# Patient Record
Sex: Female | Born: 1980 | Race: Black or African American | Hispanic: No | Marital: Single | Smoking: Current every day smoker
Health system: Southern US, Community
[De-identification: ages and names within clinical notes are randomized; demographics above are authoritative.]

## PROBLEM LIST (undated history)

## (undated) ENCOUNTER — Inpatient Hospital Stay (HOSPITAL_COMMUNITY): Payer: Self-pay

## (undated) DIAGNOSIS — O360190 Maternal care for anti-D [Rh] antibodies, unspecified trimester, not applicable or unspecified: Secondary | ICD-10-CM

## (undated) DIAGNOSIS — Z8669 Personal history of other diseases of the nervous system and sense organs: Secondary | ICD-10-CM

## (undated) DIAGNOSIS — I10 Essential (primary) hypertension: Secondary | ICD-10-CM

## (undated) DIAGNOSIS — F329 Major depressive disorder, single episode, unspecified: Secondary | ICD-10-CM

## (undated) DIAGNOSIS — Z8619 Personal history of other infectious and parasitic diseases: Secondary | ICD-10-CM

## (undated) DIAGNOSIS — F32A Depression, unspecified: Secondary | ICD-10-CM

## (undated) DIAGNOSIS — A599 Trichomoniasis, unspecified: Secondary | ICD-10-CM

## (undated) DIAGNOSIS — IMO0002 Reserved for concepts with insufficient information to code with codable children: Secondary | ICD-10-CM

## (undated) DIAGNOSIS — O99345 Other mental disorders complicating the puerperium: Secondary | ICD-10-CM

## (undated) DIAGNOSIS — E7800 Pure hypercholesterolemia, unspecified: Secondary | ICD-10-CM

## (undated) DIAGNOSIS — R87619 Unspecified abnormal cytological findings in specimens from cervix uteri: Secondary | ICD-10-CM

## (undated) DIAGNOSIS — Z9851 Tubal ligation status: Secondary | ICD-10-CM

## (undated) DIAGNOSIS — F53 Postpartum depression: Secondary | ICD-10-CM

## (undated) DIAGNOSIS — Z98891 History of uterine scar from previous surgery: Secondary | ICD-10-CM

## (undated) DIAGNOSIS — O24419 Gestational diabetes mellitus in pregnancy, unspecified control: Secondary | ICD-10-CM

## (undated) DIAGNOSIS — R51 Headache: Secondary | ICD-10-CM

## (undated) DIAGNOSIS — O360990 Maternal care for other rhesus isoimmunization, unspecified trimester, not applicable or unspecified: Secondary | ICD-10-CM

## (undated) DIAGNOSIS — A549 Gonococcal infection, unspecified: Secondary | ICD-10-CM

## (undated) DIAGNOSIS — K219 Gastro-esophageal reflux disease without esophagitis: Secondary | ICD-10-CM

## (undated) HISTORY — DX: Unspecified abnormal cytological findings in specimens from cervix uteri: R87.619

## (undated) HISTORY — PX: TUBAL LIGATION: SHX77

## (undated) HISTORY — DX: Personal history of other diseases of the nervous system and sense organs: Z86.69

## (undated) HISTORY — DX: Postpartum depression: F53.0

## (undated) HISTORY — DX: Reserved for concepts with insufficient information to code with codable children: IMO0002

## (undated) HISTORY — DX: Maternal care for anti-d (rh) antibodies, unspecified trimester, not applicable or unspecified: O36.0190

## (undated) HISTORY — DX: Other mental disorders complicating the puerperium: O99.345

## (undated) HISTORY — DX: Personal history of other infectious and parasitic diseases: Z86.19

## (undated) HISTORY — DX: Headache: R51

## (undated) HISTORY — DX: Depression, unspecified: F32.A

## (undated) HISTORY — DX: Gestational diabetes mellitus in pregnancy, unspecified control: O24.419

## (undated) HISTORY — DX: Gastro-esophageal reflux disease without esophagitis: K21.9

## (undated) HISTORY — DX: Major depressive disorder, single episode, unspecified: F32.9

## (undated) HISTORY — DX: Tubal ligation status: Z98.51

---
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
## 2002-04-29 ENCOUNTER — Emergency Department (HOSPITAL_COMMUNITY): Admission: EM | Admit: 2002-04-29 | Discharge: 2002-04-29 | Payer: Self-pay | Admitting: Emergency Medicine

## 2004-01-06 ENCOUNTER — Inpatient Hospital Stay (HOSPITAL_COMMUNITY): Admission: AD | Admit: 2004-01-06 | Discharge: 2004-01-06 | Payer: Self-pay | Admitting: *Deleted

## 2004-01-17 ENCOUNTER — Ambulatory Visit (HOSPITAL_COMMUNITY): Admission: RE | Admit: 2004-01-17 | Discharge: 2004-01-17 | Payer: Self-pay | Admitting: *Deleted

## 2004-02-22 ENCOUNTER — Ambulatory Visit (HOSPITAL_COMMUNITY): Admission: RE | Admit: 2004-02-22 | Discharge: 2004-02-22 | Payer: Self-pay | Admitting: Family Medicine

## 2004-03-14 ENCOUNTER — Ambulatory Visit (HOSPITAL_COMMUNITY): Admission: RE | Admit: 2004-03-14 | Discharge: 2004-03-14 | Payer: Self-pay | Admitting: Family Medicine

## 2004-03-14 ENCOUNTER — Encounter: Admission: RE | Admit: 2004-03-14 | Discharge: 2004-03-14 | Payer: Self-pay | Admitting: Family Medicine

## 2004-03-21 ENCOUNTER — Encounter: Admission: RE | Admit: 2004-03-21 | Discharge: 2004-03-21 | Payer: Self-pay | Admitting: Family Medicine

## 2004-03-28 ENCOUNTER — Encounter: Admission: RE | Admit: 2004-03-28 | Discharge: 2004-03-28 | Payer: Self-pay | Admitting: Family Medicine

## 2004-03-28 ENCOUNTER — Ambulatory Visit (HOSPITAL_COMMUNITY): Admission: RE | Admit: 2004-03-28 | Discharge: 2004-03-28 | Payer: Self-pay | Admitting: Family Medicine

## 2004-04-02 ENCOUNTER — Inpatient Hospital Stay (HOSPITAL_COMMUNITY): Admission: AD | Admit: 2004-04-02 | Discharge: 2004-04-03 | Payer: Self-pay | Admitting: *Deleted

## 2004-04-04 ENCOUNTER — Encounter: Admission: RE | Admit: 2004-04-04 | Discharge: 2004-04-04 | Payer: Self-pay | Admitting: *Deleted

## 2004-04-11 ENCOUNTER — Ambulatory Visit: Payer: Self-pay | Admitting: Family Medicine

## 2004-04-11 ENCOUNTER — Inpatient Hospital Stay (HOSPITAL_COMMUNITY): Admission: RE | Admit: 2004-04-11 | Discharge: 2004-04-11 | Payer: Self-pay | Admitting: *Deleted

## 2004-04-11 ENCOUNTER — Encounter: Admission: RE | Admit: 2004-04-11 | Discharge: 2004-04-11 | Payer: Self-pay | Admitting: Family Medicine

## 2004-04-18 ENCOUNTER — Encounter: Admission: RE | Admit: 2004-04-18 | Discharge: 2004-04-18 | Payer: Self-pay | Admitting: Obstetrics and Gynecology

## 2004-04-18 ENCOUNTER — Other Ambulatory Visit: Admission: RE | Admit: 2004-04-18 | Discharge: 2004-04-18 | Payer: Self-pay | Admitting: Obstetrics and Gynecology

## 2004-04-19 ENCOUNTER — Encounter (INDEPENDENT_AMBULATORY_CARE_PROVIDER_SITE_OTHER): Payer: Self-pay | Admitting: *Deleted

## 2004-04-25 ENCOUNTER — Ambulatory Visit: Payer: Self-pay | Admitting: Family Medicine

## 2004-04-25 ENCOUNTER — Ambulatory Visit (HOSPITAL_COMMUNITY): Admission: RE | Admit: 2004-04-25 | Discharge: 2004-04-25 | Payer: Self-pay | Admitting: *Deleted

## 2004-05-02 ENCOUNTER — Ambulatory Visit: Payer: Self-pay | Admitting: Family Medicine

## 2004-05-02 ENCOUNTER — Ambulatory Visit (HOSPITAL_COMMUNITY): Admission: RE | Admit: 2004-05-02 | Discharge: 2004-05-02 | Payer: Self-pay | Admitting: *Deleted

## 2004-05-09 ENCOUNTER — Inpatient Hospital Stay (HOSPITAL_COMMUNITY): Admission: RE | Admit: 2004-05-09 | Discharge: 2004-05-09 | Payer: Self-pay | Admitting: *Deleted

## 2004-05-09 ENCOUNTER — Ambulatory Visit: Payer: Self-pay | Admitting: Family Medicine

## 2004-05-14 ENCOUNTER — Ambulatory Visit: Payer: Self-pay | Admitting: *Deleted

## 2004-05-16 ENCOUNTER — Ambulatory Visit: Payer: Self-pay | Admitting: Family Medicine

## 2004-05-16 ENCOUNTER — Ambulatory Visit (HOSPITAL_COMMUNITY): Admission: RE | Admit: 2004-05-16 | Discharge: 2004-05-16 | Payer: Self-pay | Admitting: *Deleted

## 2004-05-20 ENCOUNTER — Ambulatory Visit: Payer: Self-pay | Admitting: Obstetrics & Gynecology

## 2004-05-23 ENCOUNTER — Ambulatory Visit (HOSPITAL_COMMUNITY): Admission: RE | Admit: 2004-05-23 | Discharge: 2004-05-23 | Payer: Self-pay | Admitting: *Deleted

## 2004-05-23 ENCOUNTER — Ambulatory Visit: Payer: Self-pay | Admitting: Family Medicine

## 2004-05-30 ENCOUNTER — Ambulatory Visit: Payer: Self-pay | Admitting: Family Medicine

## 2004-05-30 ENCOUNTER — Ambulatory Visit (HOSPITAL_COMMUNITY): Admission: RE | Admit: 2004-05-30 | Discharge: 2004-05-30 | Payer: Self-pay | Admitting: *Deleted

## 2004-06-04 ENCOUNTER — Ambulatory Visit: Payer: Self-pay | Admitting: *Deleted

## 2004-06-04 ENCOUNTER — Ambulatory Visit (HOSPITAL_COMMUNITY): Admission: RE | Admit: 2004-06-04 | Discharge: 2004-06-04 | Payer: Self-pay | Admitting: Family Medicine

## 2004-06-06 ENCOUNTER — Ambulatory Visit: Payer: Self-pay | Admitting: *Deleted

## 2004-06-06 ENCOUNTER — Ambulatory Visit (HOSPITAL_COMMUNITY): Admission: RE | Admit: 2004-06-06 | Discharge: 2004-06-06 | Payer: Self-pay | Admitting: Obstetrics and Gynecology

## 2004-06-13 ENCOUNTER — Ambulatory Visit (HOSPITAL_COMMUNITY): Admission: RE | Admit: 2004-06-13 | Discharge: 2004-06-13 | Payer: Self-pay | Admitting: *Deleted

## 2004-06-13 ENCOUNTER — Ambulatory Visit: Payer: Self-pay | Admitting: Family Medicine

## 2004-06-15 ENCOUNTER — Inpatient Hospital Stay (HOSPITAL_COMMUNITY): Admission: AD | Admit: 2004-06-15 | Discharge: 2004-06-19 | Payer: Self-pay | Admitting: *Deleted

## 2004-06-15 ENCOUNTER — Ambulatory Visit: Payer: Self-pay | Admitting: *Deleted

## 2004-06-16 ENCOUNTER — Encounter (INDEPENDENT_AMBULATORY_CARE_PROVIDER_SITE_OTHER): Payer: Self-pay | Admitting: Specialist

## 2005-09-14 ENCOUNTER — Emergency Department (HOSPITAL_COMMUNITY): Admission: EM | Admit: 2005-09-14 | Discharge: 2005-09-15 | Payer: Self-pay | Admitting: Emergency Medicine

## 2005-09-21 ENCOUNTER — Emergency Department (HOSPITAL_COMMUNITY): Admission: EM | Admit: 2005-09-21 | Discharge: 2005-09-21 | Payer: Self-pay | Admitting: Emergency Medicine

## 2006-02-16 ENCOUNTER — Emergency Department (HOSPITAL_COMMUNITY): Admission: EM | Admit: 2006-02-16 | Discharge: 2006-02-17 | Payer: Self-pay | Admitting: Emergency Medicine

## 2006-02-27 ENCOUNTER — Emergency Department (HOSPITAL_COMMUNITY): Admission: EM | Admit: 2006-02-27 | Discharge: 2006-02-27 | Payer: Self-pay | Admitting: *Deleted

## 2006-03-14 ENCOUNTER — Emergency Department (HOSPITAL_COMMUNITY): Admission: EM | Admit: 2006-03-14 | Discharge: 2006-03-15 | Payer: Self-pay | Admitting: Emergency Medicine

## 2006-03-20 ENCOUNTER — Emergency Department (HOSPITAL_COMMUNITY): Admission: EM | Admit: 2006-03-20 | Discharge: 2006-03-20 | Payer: Self-pay | Admitting: Emergency Medicine

## 2006-08-01 ENCOUNTER — Emergency Department (HOSPITAL_COMMUNITY): Admission: EM | Admit: 2006-08-01 | Discharge: 2006-08-02 | Payer: Self-pay | Admitting: Emergency Medicine

## 2006-08-11 ENCOUNTER — Emergency Department (HOSPITAL_COMMUNITY): Admission: EM | Admit: 2006-08-11 | Discharge: 2006-08-11 | Payer: Self-pay | Admitting: Emergency Medicine

## 2006-08-25 DIAGNOSIS — O360190 Maternal care for anti-D [Rh] antibodies, unspecified trimester, not applicable or unspecified: Secondary | ICD-10-CM

## 2006-08-25 DIAGNOSIS — O24419 Gestational diabetes mellitus in pregnancy, unspecified control: Secondary | ICD-10-CM

## 2006-08-25 HISTORY — DX: Maternal care for anti-d (rh) antibodies, unspecified trimester, not applicable or unspecified: O36.0190

## 2006-08-25 HISTORY — DX: Gestational diabetes mellitus in pregnancy, unspecified control: O24.419

## 2006-09-18 ENCOUNTER — Emergency Department (HOSPITAL_COMMUNITY): Admission: EM | Admit: 2006-09-18 | Discharge: 2006-09-18 | Payer: Self-pay | Admitting: Gastroenterology

## 2006-11-26 ENCOUNTER — Ambulatory Visit (HOSPITAL_COMMUNITY): Admission: RE | Admit: 2006-11-26 | Discharge: 2006-11-26 | Payer: Self-pay | Admitting: Obstetrics and Gynecology

## 2006-12-09 ENCOUNTER — Emergency Department (HOSPITAL_COMMUNITY): Admission: EM | Admit: 2006-12-09 | Discharge: 2006-12-09 | Payer: Self-pay | Admitting: Emergency Medicine

## 2007-04-13 ENCOUNTER — Inpatient Hospital Stay (HOSPITAL_COMMUNITY): Admission: RE | Admit: 2007-04-13 | Discharge: 2007-04-16 | Payer: Self-pay | Admitting: Obstetrics and Gynecology

## 2007-04-13 ENCOUNTER — Encounter (INDEPENDENT_AMBULATORY_CARE_PROVIDER_SITE_OTHER): Payer: Self-pay | Admitting: Obstetrics and Gynecology

## 2007-12-08 ENCOUNTER — Emergency Department (HOSPITAL_COMMUNITY): Admission: EM | Admit: 2007-12-08 | Discharge: 2007-12-08 | Payer: Self-pay | Admitting: Emergency Medicine

## 2008-03-03 ENCOUNTER — Emergency Department (HOSPITAL_COMMUNITY): Admission: EM | Admit: 2008-03-03 | Discharge: 2008-03-03 | Payer: Self-pay | Admitting: Emergency Medicine

## 2008-12-23 ENCOUNTER — Emergency Department (HOSPITAL_COMMUNITY): Admission: EM | Admit: 2008-12-23 | Discharge: 2008-12-23 | Payer: Self-pay | Admitting: Emergency Medicine

## 2009-01-01 ENCOUNTER — Ambulatory Visit: Payer: Self-pay | Admitting: Obstetrics & Gynecology

## 2009-01-01 ENCOUNTER — Encounter: Payer: Self-pay | Admitting: Family Medicine

## 2009-01-01 LAB — CONVERTED CEMR LAB: Antibody Screen: POSITIVE — AB

## 2009-01-02 ENCOUNTER — Ambulatory Visit (HOSPITAL_COMMUNITY): Admission: RE | Admit: 2009-01-02 | Discharge: 2009-01-02 | Payer: Self-pay | Admitting: Family Medicine

## 2009-01-12 ENCOUNTER — Ambulatory Visit (HOSPITAL_COMMUNITY): Admission: RE | Admit: 2009-01-12 | Discharge: 2009-01-12 | Payer: Self-pay | Admitting: Family Medicine

## 2009-01-12 ENCOUNTER — Encounter: Payer: Self-pay | Admitting: Family Medicine

## 2009-01-25 ENCOUNTER — Ambulatory Visit: Payer: Self-pay | Admitting: Obstetrics & Gynecology

## 2009-01-25 LAB — CONVERTED CEMR LAB: Antibody Screen: POSITIVE — AB

## 2009-02-01 ENCOUNTER — Ambulatory Visit: Payer: Self-pay | Admitting: Obstetrics & Gynecology

## 2009-02-02 ENCOUNTER — Encounter: Payer: Self-pay | Admitting: Family Medicine

## 2009-02-02 LAB — CONVERTED CEMR LAB

## 2009-02-06 ENCOUNTER — Ambulatory Visit (HOSPITAL_COMMUNITY): Admission: RE | Admit: 2009-02-06 | Discharge: 2009-02-06 | Payer: Self-pay | Admitting: Obstetrics & Gynecology

## 2009-02-08 ENCOUNTER — Encounter: Payer: Self-pay | Admitting: Family Medicine

## 2009-02-08 ENCOUNTER — Ambulatory Visit: Payer: Self-pay | Admitting: Obstetrics & Gynecology

## 2009-02-13 ENCOUNTER — Ambulatory Visit (HOSPITAL_COMMUNITY): Admission: RE | Admit: 2009-02-13 | Discharge: 2009-02-13 | Payer: Self-pay | Admitting: Obstetrics & Gynecology

## 2009-02-15 ENCOUNTER — Ambulatory Visit: Payer: Self-pay | Admitting: Obstetrics & Gynecology

## 2009-02-20 ENCOUNTER — Ambulatory Visit (HOSPITAL_COMMUNITY): Admission: RE | Admit: 2009-02-20 | Discharge: 2009-02-20 | Payer: Self-pay | Admitting: Obstetrics & Gynecology

## 2009-02-22 ENCOUNTER — Ambulatory Visit: Payer: Self-pay | Admitting: Obstetrics & Gynecology

## 2009-02-22 ENCOUNTER — Encounter: Payer: Self-pay | Admitting: Obstetrics & Gynecology

## 2009-02-22 LAB — CONVERTED CEMR LAB
Antibody Screen: POSITIVE — AB
Chlamydia, DNA Probe: NEGATIVE
GC Probe Amp, Genital: NEGATIVE
Platelets: 149 10*3/uL — ABNORMAL LOW (ref 150–400)
RDW: 13.6 % (ref 11.5–15.5)
WBC: 7.6 10*3/uL (ref 4.0–10.5)

## 2009-02-23 ENCOUNTER — Encounter: Payer: Self-pay | Admitting: Obstetrics & Gynecology

## 2009-02-23 LAB — CONVERTED CEMR LAB
Clue Cells Wet Prep HPF POC: NONE SEEN
Trich, Wet Prep: NONE SEEN

## 2009-03-01 ENCOUNTER — Ambulatory Visit: Payer: Self-pay | Admitting: Obstetrics & Gynecology

## 2009-03-06 ENCOUNTER — Ambulatory Visit (HOSPITAL_COMMUNITY): Admission: RE | Admit: 2009-03-06 | Discharge: 2009-03-06 | Payer: Self-pay | Admitting: Family Medicine

## 2009-03-06 ENCOUNTER — Encounter: Payer: Self-pay | Admitting: Obstetrics & Gynecology

## 2009-03-06 ENCOUNTER — Encounter: Payer: Self-pay | Admitting: Family Medicine

## 2009-03-08 ENCOUNTER — Ambulatory Visit: Payer: Self-pay | Admitting: Obstetrics & Gynecology

## 2009-03-14 ENCOUNTER — Ambulatory Visit (HOSPITAL_COMMUNITY): Admission: RE | Admit: 2009-03-14 | Discharge: 2009-03-14 | Payer: Self-pay | Admitting: Obstetrics & Gynecology

## 2009-03-14 ENCOUNTER — Encounter: Payer: Self-pay | Admitting: Family Medicine

## 2009-03-19 ENCOUNTER — Ambulatory Visit: Payer: Self-pay | Admitting: Obstetrics & Gynecology

## 2009-03-19 ENCOUNTER — Encounter: Admission: RE | Admit: 2009-03-19 | Discharge: 2009-03-19 | Payer: Self-pay | Admitting: Obstetrics & Gynecology

## 2009-03-20 ENCOUNTER — Ambulatory Visit: Payer: Self-pay | Admitting: Obstetrics & Gynecology

## 2009-03-21 ENCOUNTER — Ambulatory Visit (HOSPITAL_COMMUNITY): Admission: RE | Admit: 2009-03-21 | Discharge: 2009-03-21 | Payer: Self-pay | Admitting: Obstetrics & Gynecology

## 2009-03-23 ENCOUNTER — Ambulatory Visit: Payer: Self-pay | Admitting: Obstetrics & Gynecology

## 2009-03-26 ENCOUNTER — Ambulatory Visit: Payer: Self-pay | Admitting: Obstetrics & Gynecology

## 2009-03-29 ENCOUNTER — Ambulatory Visit (HOSPITAL_COMMUNITY): Admission: RE | Admit: 2009-03-29 | Discharge: 2009-03-29 | Payer: Self-pay | Admitting: Obstetrics & Gynecology

## 2009-03-29 ENCOUNTER — Ambulatory Visit: Payer: Self-pay | Admitting: Obstetrics and Gynecology

## 2009-04-02 ENCOUNTER — Ambulatory Visit: Payer: Self-pay | Admitting: Obstetrics & Gynecology

## 2009-04-02 ENCOUNTER — Encounter: Payer: Self-pay | Admitting: Obstetrics & Gynecology

## 2009-04-03 ENCOUNTER — Encounter: Payer: Self-pay | Admitting: Obstetrics & Gynecology

## 2009-04-03 LAB — CONVERTED CEMR LAB: Yeast Wet Prep HPF POC: NONE SEEN

## 2009-04-09 ENCOUNTER — Ambulatory Visit: Payer: Self-pay | Admitting: Obstetrics & Gynecology

## 2009-04-12 ENCOUNTER — Ambulatory Visit: Payer: Self-pay | Admitting: Obstetrics and Gynecology

## 2009-04-16 ENCOUNTER — Ambulatory Visit: Payer: Self-pay | Admitting: Obstetrics & Gynecology

## 2009-04-19 ENCOUNTER — Ambulatory Visit (HOSPITAL_COMMUNITY): Admission: RE | Admit: 2009-04-19 | Discharge: 2009-04-19 | Payer: Self-pay | Admitting: Family Medicine

## 2009-04-19 ENCOUNTER — Ambulatory Visit: Payer: Self-pay | Admitting: Obstetrics & Gynecology

## 2009-04-23 ENCOUNTER — Ambulatory Visit: Payer: Self-pay | Admitting: Obstetrics & Gynecology

## 2009-04-25 ENCOUNTER — Inpatient Hospital Stay (HOSPITAL_COMMUNITY): Admission: RE | Admit: 2009-04-25 | Discharge: 2009-04-28 | Payer: Self-pay | Admitting: Obstetrics & Gynecology

## 2009-04-25 ENCOUNTER — Ambulatory Visit: Payer: Self-pay | Admitting: Obstetrics & Gynecology

## 2010-01-23 IMAGING — US US MFM MCA DOPPLER RE-EVAL
1 series · 15 of 15 positions shown · non-contrast
Comparison: none

OBSTETRICAL ULTRASOUND:
 This ultrasound was performed in The [HOSPITAL], and the [facility] report will be stored to [REDACTED] PACS.

[Series 1: us mfm mca doppler re-eval · 15 of 15 slices shown]
[im 1/15]
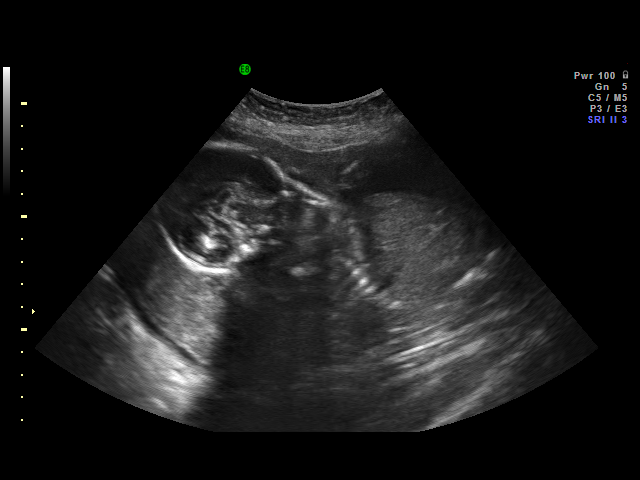
[im 2/15]
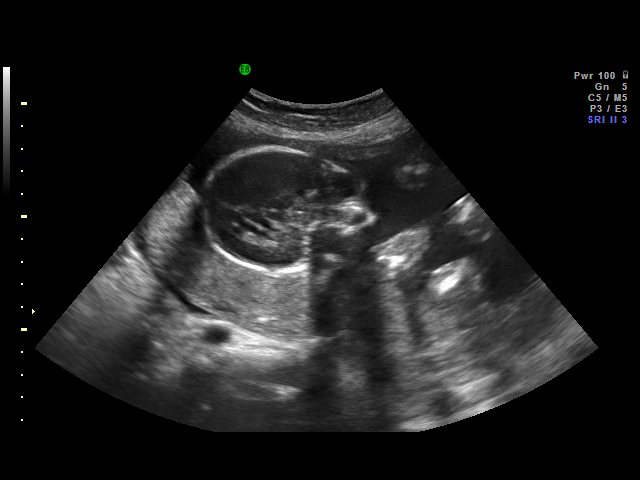
[im 3/15]
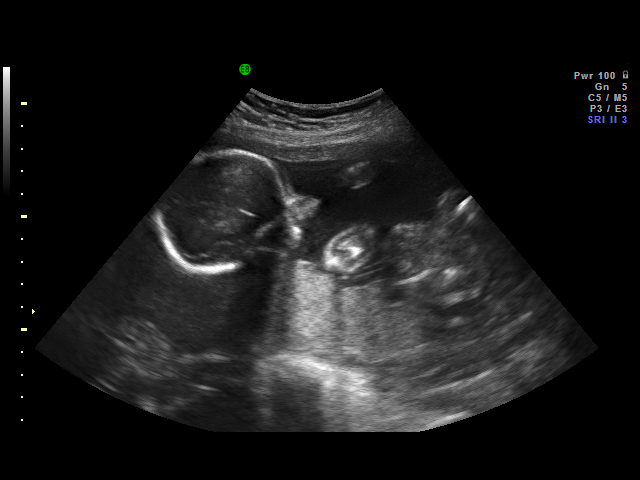
[im 4/15]
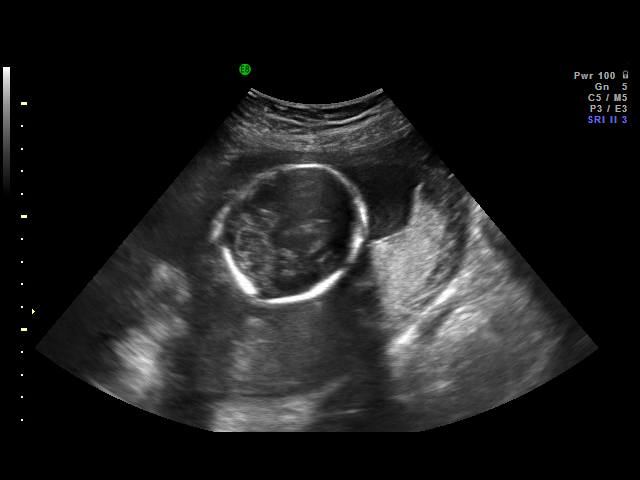
[im 5/15]
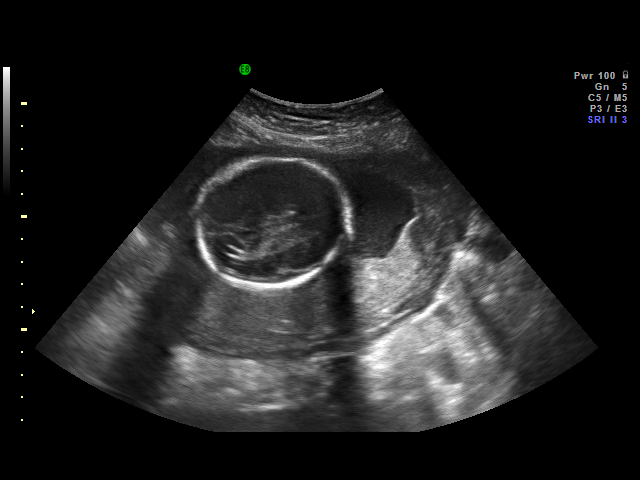
[im 6/15]
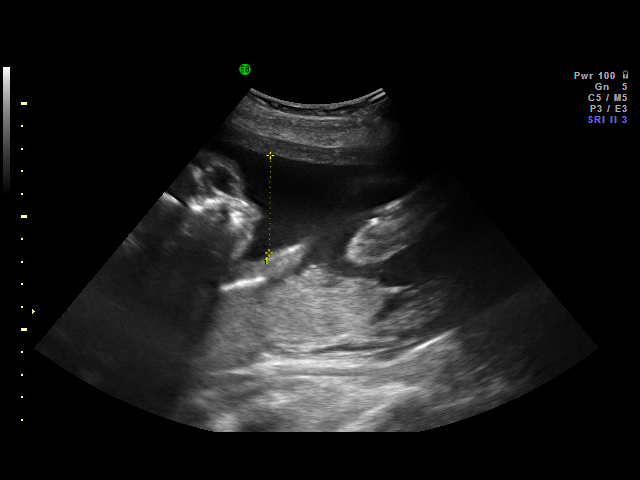
[im 7/15]
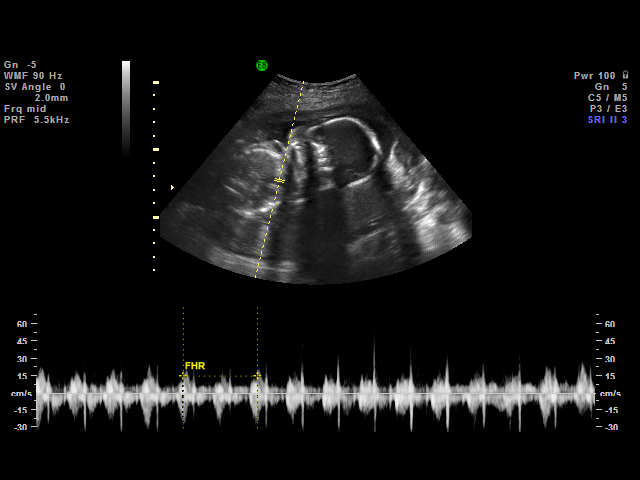
[im 8/15]
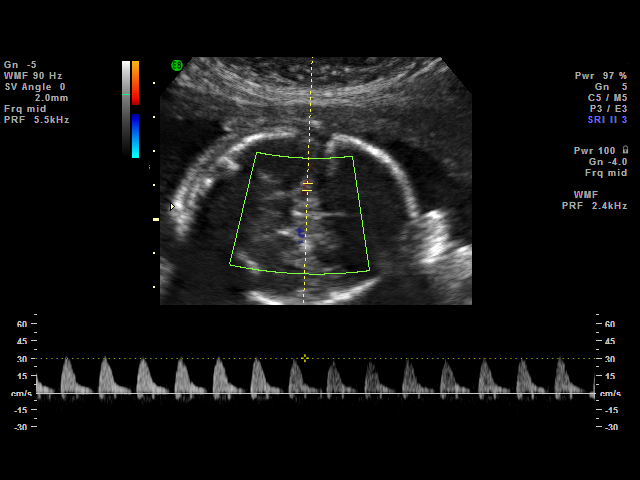
[im 9/15]
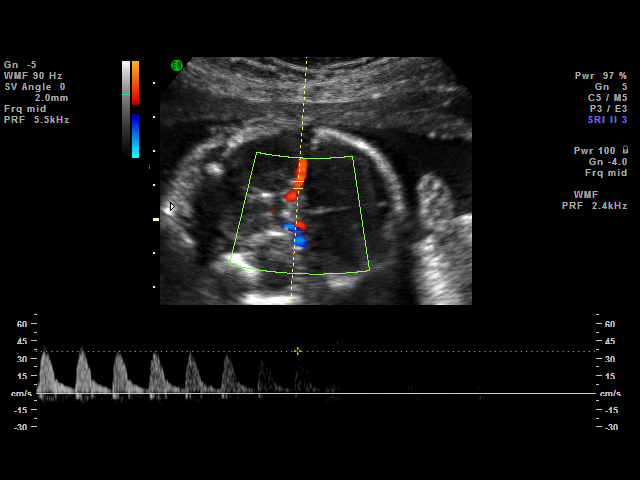
[im 10/15]
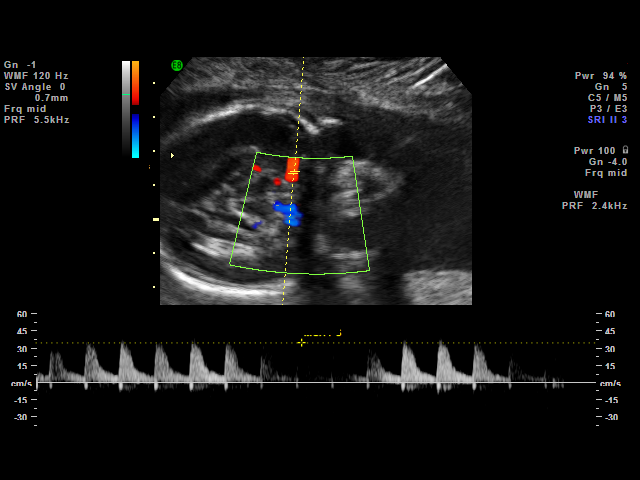
[im 11/15]
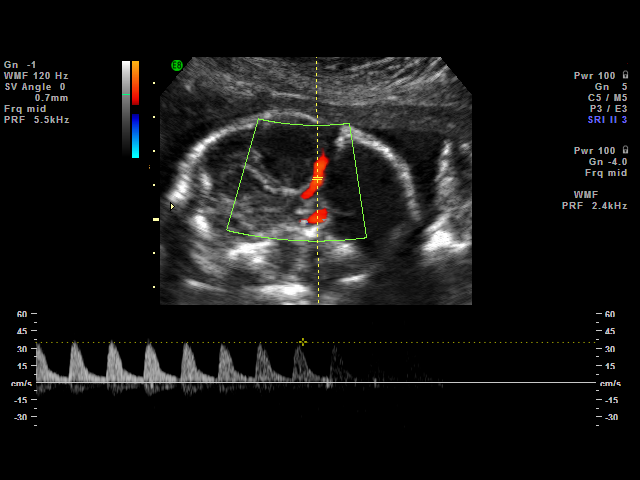
[im 12/15]
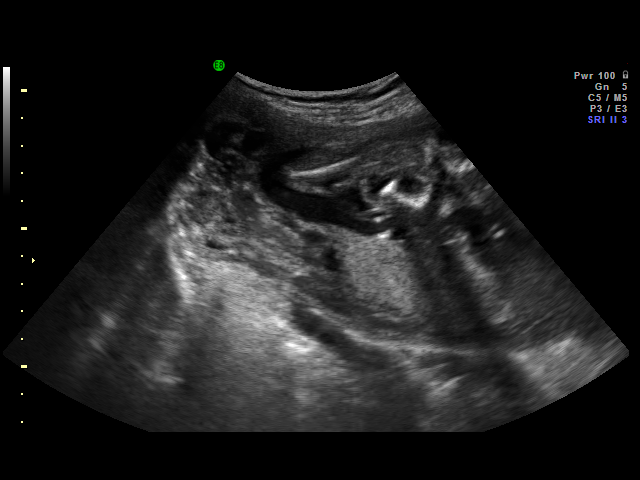
[im 13/15]
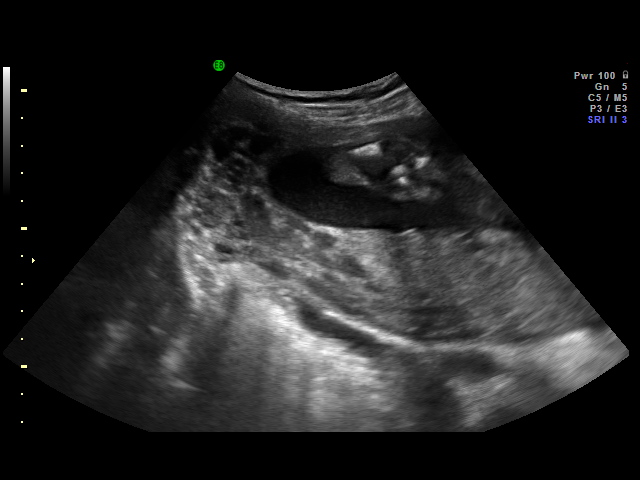
[im 14/15]
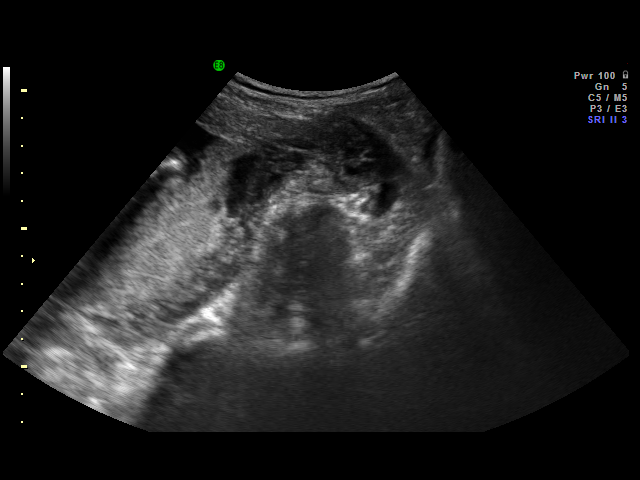
[im 15/15]
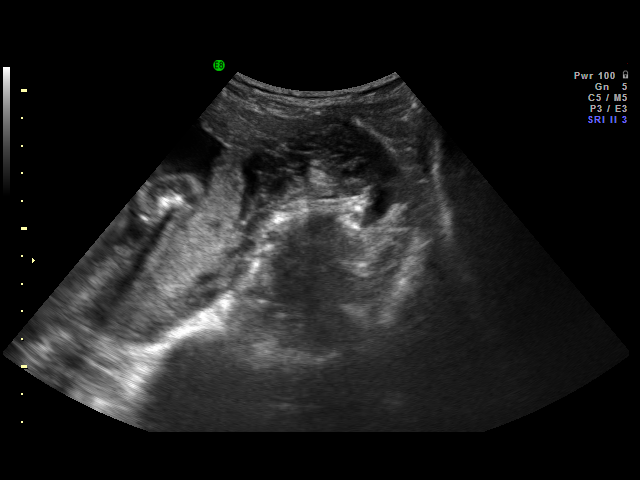

[15 of 15 positions shown; findings below may reference images not displayed]

IMPRESSION: [facility] has also been faxed to the ordering physician.

## 2010-06-27 ENCOUNTER — Emergency Department (HOSPITAL_COMMUNITY): Admission: EM | Admit: 2010-06-27 | Discharge: 2010-06-27 | Payer: Self-pay | Admitting: Emergency Medicine

## 2010-07-30 ENCOUNTER — Emergency Department (HOSPITAL_COMMUNITY)
Admission: EM | Admit: 2010-07-30 | Discharge: 2010-07-30 | Payer: Self-pay | Source: Home / Self Care | Admitting: Emergency Medicine

## 2010-08-09 ENCOUNTER — Emergency Department (HOSPITAL_COMMUNITY)
Admission: EM | Admit: 2010-08-09 | Discharge: 2010-08-10 | Payer: Self-pay | Source: Home / Self Care | Admitting: Emergency Medicine

## 2010-08-20 ENCOUNTER — Emergency Department (HOSPITAL_COMMUNITY)
Admission: EM | Admit: 2010-08-20 | Discharge: 2010-08-20 | Payer: Self-pay | Source: Home / Self Care | Admitting: Emergency Medicine

## 2010-08-25 DIAGNOSIS — O24419 Gestational diabetes mellitus in pregnancy, unspecified control: Secondary | ICD-10-CM

## 2010-08-25 HISTORY — DX: Gestational diabetes mellitus in pregnancy, unspecified control: O24.419

## 2010-09-14 ENCOUNTER — Encounter: Payer: Self-pay | Admitting: *Deleted

## 2010-09-15 ENCOUNTER — Encounter: Payer: Self-pay | Admitting: Family Medicine

## 2010-09-15 ENCOUNTER — Encounter: Payer: Self-pay | Admitting: *Deleted

## 2010-09-16 ENCOUNTER — Encounter: Payer: Self-pay | Admitting: *Deleted

## 2010-09-16 ENCOUNTER — Encounter: Payer: Self-pay | Admitting: Family Medicine

## 2010-10-17 ENCOUNTER — Emergency Department (HOSPITAL_COMMUNITY)
Admission: EM | Admit: 2010-10-17 | Discharge: 2010-10-17 | Disposition: A | Discharge status: Home / Self Care | Payer: Medicaid Other | Attending: Emergency Medicine | Admitting: Emergency Medicine

## 2010-10-17 DIAGNOSIS — Y92009 Unspecified place in unspecified non-institutional (private) residence as the place of occurrence of the external cause: Secondary | ICD-10-CM | POA: Insufficient documentation

## 2010-10-17 DIAGNOSIS — IMO0002 Reserved for concepts with insufficient information to code with codable children: Secondary | ICD-10-CM | POA: Insufficient documentation

## 2010-10-17 LAB — CBC
MCH: 29.7 pg (ref 26.0–34.0)
MCHC: 33.4 g/dL (ref 30.0–36.0)
MCV: 88.9 fL (ref 78.0–100.0)
Platelets: 169 10*3/uL (ref 150–400)
RDW: 14 % (ref 11.5–15.5)
WBC: 8.9 10*3/uL (ref 4.0–10.5)

## 2010-10-17 LAB — URINALYSIS, ROUTINE W REFLEX MICROSCOPIC
Hgb urine dipstick: NEGATIVE
Ketones, ur: NEGATIVE mg/dL
Protein, ur: NEGATIVE mg/dL
Urine Glucose, Fasting: NEGATIVE mg/dL
Urobilinogen, UA: 1 mg/dL (ref 0.0–1.0)

## 2010-10-17 LAB — POCT I-STAT, CHEM 8
BUN: 9 mg/dL (ref 6–23)
Calcium, Ion: 1.07 mmol/L — ABNORMAL LOW (ref 1.12–1.32)
Chloride: 105 mEq/L (ref 96–112)
Creatinine, Ser: 0.5 mg/dL (ref 0.4–1.2)
TCO2: 19 mmol/L (ref 0–100)

## 2010-10-17 LAB — POCT I-STAT 3, VENOUS BLOOD GAS (G3P V)
O2 Saturation: 59 %
TCO2: 24 mmol/L (ref 0–100)
pCO2, Ven: 37.3 mmHg — ABNORMAL LOW (ref 45.0–50.0)
pH, Ven: 7.393 — ABNORMAL HIGH (ref 7.250–7.300)
pO2, Ven: 31 mmHg (ref 30.0–45.0)

## 2010-10-17 LAB — GLUCOSE, CAPILLARY: Glucose-Capillary: 170 mg/dL — ABNORMAL HIGH (ref 70–99)

## 2010-10-17 LAB — TYPE AND SCREEN
ABO/RH(D): A NEG
DAT, IgG: NEGATIVE

## 2010-10-17 LAB — DIFFERENTIAL
Lymphocytes Relative: 29 % (ref 12–46)
Lymphs Abs: 2.6 10*3/uL (ref 0.7–4.0)
Monocytes Absolute: 0.4 10*3/uL (ref 0.1–1.0)
Monocytes Relative: 4 % (ref 3–12)
Neutro Abs: 5.8 10*3/uL (ref 1.7–7.7)
Neutrophils Relative %: 65 % (ref 43–77)

## 2010-10-18 LAB — RHOGAM INJECTION

## 2010-10-18 LAB — URINE CULTURE
Colony Count: NO GROWTH
Culture  Setup Time: 201202231617
Culture: NO GROWTH

## 2010-11-04 LAB — URINALYSIS, ROUTINE W REFLEX MICROSCOPIC
Bilirubin Urine: NEGATIVE
Bilirubin Urine: NEGATIVE
Glucose, UA: NEGATIVE mg/dL
Hgb urine dipstick: NEGATIVE
Ketones, ur: NEGATIVE mg/dL
Ketones, ur: NEGATIVE mg/dL
Nitrite: NEGATIVE
Protein, ur: NEGATIVE mg/dL
Protein, ur: NEGATIVE mg/dL
Urobilinogen, UA: 1 mg/dL (ref 0.0–1.0)

## 2010-11-04 LAB — POCT PREGNANCY, URINE: Preg Test, Ur: POSITIVE

## 2010-11-04 LAB — URINE MICROSCOPIC-ADD ON

## 2010-11-04 LAB — GC/CHLAMYDIA PROBE AMP, URINE: Chlamydia, Swab/Urine, PCR: NEGATIVE

## 2010-11-04 LAB — ABO/RH: ABO/RH(D): A NEG

## 2010-11-04 LAB — HCG, QUANTITATIVE, PREGNANCY: hCG, Beta Chain, Quant, S: 75667 m[IU]/mL — ABNORMAL HIGH (ref <5)

## 2010-11-05 LAB — RAPID STREP SCREEN (MED CTR MEBANE ONLY): Streptococcus, Group A Screen (Direct): NEGATIVE

## 2010-11-06 ENCOUNTER — Other Ambulatory Visit: Payer: Self-pay | Admitting: Family Medicine

## 2010-11-06 DIAGNOSIS — Z3689 Encounter for other specified antenatal screening: Secondary | ICD-10-CM

## 2010-11-07 ENCOUNTER — Ambulatory Visit (HOSPITAL_COMMUNITY): Payer: Medicaid Other | Attending: Family Medicine

## 2010-11-12 ENCOUNTER — Ambulatory Visit (HOSPITAL_COMMUNITY)
Admission: RE | Admit: 2010-11-12 | Discharge: 2010-11-12 | Disposition: A | Discharge status: Home / Self Care | Payer: Medicaid Other | Source: Ambulatory Visit | Attending: Family Medicine | Admitting: Family Medicine

## 2010-11-12 ENCOUNTER — Encounter (HOSPITAL_COMMUNITY): Payer: Self-pay

## 2010-11-12 DIAGNOSIS — Z3689 Encounter for other specified antenatal screening: Secondary | ICD-10-CM

## 2010-11-12 DIAGNOSIS — Z363 Encounter for antenatal screening for malformations: Secondary | ICD-10-CM | POA: Insufficient documentation

## 2010-11-12 DIAGNOSIS — Z1389 Encounter for screening for other disorder: Secondary | ICD-10-CM | POA: Insufficient documentation

## 2010-11-12 DIAGNOSIS — O358XX0 Maternal care for other (suspected) fetal abnormality and damage, not applicable or unspecified: Secondary | ICD-10-CM | POA: Insufficient documentation

## 2010-11-29 LAB — RPR: RPR Ser Ql: NONREACTIVE

## 2010-11-29 LAB — CBC
HCT: 29.8 % — ABNORMAL LOW (ref 36.0–46.0)
HCT: 34.4 % — ABNORMAL LOW (ref 36.0–46.0)
MCHC: 35.1 g/dL (ref 30.0–36.0)
MCV: 91.7 fL (ref 78.0–100.0)
MCV: 92.3 fL (ref 78.0–100.0)
Platelets: 103 10*3/uL — ABNORMAL LOW (ref 150–400)
Platelets: 118 10*3/uL — ABNORMAL LOW (ref 150–400)
RBC: 3.75 MIL/uL — ABNORMAL LOW (ref 3.87–5.11)
RDW: 13.7 % (ref 11.5–15.5)
WBC: 8 10*3/uL (ref 4.0–10.5)

## 2010-11-29 LAB — CROSSMATCH: Antibody Screen: POSITIVE

## 2010-11-30 LAB — POCT URINALYSIS DIP (DEVICE)
Bilirubin Urine: NEGATIVE
Bilirubin Urine: NEGATIVE
Bilirubin Urine: NEGATIVE
Glucose, UA: NEGATIVE mg/dL
Glucose, UA: NEGATIVE mg/dL
Hgb urine dipstick: NEGATIVE
Nitrite: NEGATIVE
Nitrite: NEGATIVE
Nitrite: NEGATIVE
Protein, ur: NEGATIVE mg/dL
Protein, ur: NEGATIVE mg/dL
Specific Gravity, Urine: 1.025 (ref 1.005–1.030)
Specific Gravity, Urine: 1.025 (ref 1.005–1.030)
Urobilinogen, UA: 1 mg/dL (ref 0.0–1.0)
Urobilinogen, UA: 1 mg/dL (ref 0.0–1.0)
Urobilinogen, UA: 0.2 mg/dL (ref 0.0–1.0)
pH: 5.5 (ref 5.0–8.0)
pH: 6 (ref 5.0–8.0)

## 2010-12-01 LAB — POCT URINALYSIS DIP (DEVICE)
Bilirubin Urine: NEGATIVE
Bilirubin Urine: NEGATIVE
Bilirubin Urine: NEGATIVE
Glucose, UA: NEGATIVE mg/dL
Glucose, UA: NEGATIVE mg/dL
Hgb urine dipstick: NEGATIVE
Hgb urine dipstick: NEGATIVE
Hgb urine dipstick: NEGATIVE
Ketones, ur: NEGATIVE mg/dL
Ketones, ur: NEGATIVE mg/dL
Ketones, ur: NEGATIVE mg/dL
Ketones, ur: NEGATIVE mg/dL
Protein, ur: NEGATIVE mg/dL
Protein, ur: 30 mg/dL — AB
Specific Gravity, Urine: 1.015 (ref 1.005–1.030)
Specific Gravity, Urine: 1.025 (ref 1.005–1.030)
Specific Gravity, Urine: >=1.03 (ref 1.005–1.030)
pH: 5.5 (ref 5.0–8.0)
pH: 5.5 (ref 5.0–8.0)
pH: 5.5 (ref 5.0–8.0)

## 2010-12-01 LAB — GLUCOSE, CAPILLARY

## 2010-12-02 LAB — POCT URINALYSIS DIP (DEVICE)
Bilirubin Urine: NEGATIVE
Glucose, UA: NEGATIVE mg/dL
Hgb urine dipstick: NEGATIVE
Hgb urine dipstick: NEGATIVE
Hgb urine dipstick: NEGATIVE
Ketones, ur: NEGATIVE mg/dL
Protein, ur: 30 mg/dL — AB
Protein, ur: NEGATIVE mg/dL
Specific Gravity, Urine: 1.02 (ref 1.005–1.030)
Specific Gravity, Urine: >=1.03 (ref 1.005–1.030)
Specific Gravity, Urine: >=1.03 (ref 1.005–1.030)
Urobilinogen, UA: 1 mg/dL (ref 0.0–1.0)
Urobilinogen, UA: 0.2 mg/dL (ref 0.0–1.0)
pH: 5.5 (ref 5.0–8.0)
pH: 5.5 (ref 5.0–8.0)
pH: 6 (ref 5.0–8.0)

## 2010-12-03 LAB — WET PREP, GENITAL
Clue Cells Wet Prep HPF POC: NONE SEEN
Yeast Wet Prep HPF POC: NONE SEEN

## 2010-12-03 LAB — GLUCOSE, CAPILLARY: Glucose-Capillary: 123 mg/dL — ABNORMAL HIGH (ref 70–99)

## 2010-12-03 LAB — POCT URINALYSIS DIP (DEVICE)
Bilirubin Urine: NEGATIVE
Hgb urine dipstick: NEGATIVE
Nitrite: NEGATIVE
Protein, ur: NEGATIVE mg/dL
pH: 5.5 (ref 5.0–8.0)

## 2010-12-03 LAB — GC/CHLAMYDIA PROBE AMP, GENITAL
Chlamydia, DNA Probe: NEGATIVE
GC Probe Amp, Genital: NEGATIVE

## 2010-12-03 LAB — URINE MICROSCOPIC-ADD ON

## 2010-12-03 LAB — URINALYSIS, ROUTINE W REFLEX MICROSCOPIC
Bilirubin Urine: NEGATIVE
Hgb urine dipstick: NEGATIVE
Specific Gravity, Urine: 1.021 (ref 1.005–1.030)
Urobilinogen, UA: 1 mg/dL (ref 0.0–1.0)
pH: 6 (ref 5.0–8.0)

## 2011-01-07 NOTE — Discharge Summary (Signed)
NAME:  Tina [name], Tina [name] NO.:  [id number]   MEDICAL RECORD NO.:  [id number]          PATIENT TYPE:  INP   LOCATION:  9102                          [facility]ILITY:  [facility]   PHYSICIAN:  Crist Fat. Rivard, M.D. DATE OF BIRTH:  Sep 03, 1980   DATE OF ADMISSION:  04/13/2007  DATE OF DISCHARGE:  04/16/2007                               DISCHARGE SUMMARY   ADMISSION DIAGNOSES:  1. Intrauterine pregnancy at 38 weeks.  2. Gestational diabetes.  3. Elective repeat cesarean section.  4. Positive anti-B antibody.   DISCHARGE DIAGNOSES:  1. Intrauterine pregnancy at 38 weeks.  2. Gestational diabetes.  3. Elective repeat cesarean section.  4. Positive anti-B antibody.   PROCEDURE:  1. Repeat low transverse cesarean section.  2. Spinal anesthesia.   HOSPITAL COURSE:  Ms. Griffith is a 30 year old, gravida 3, para 1-0-1-1  at 25 weeks who presented on the day of admission for scheduled repeat  cesarean section. Her pregnancy course was remarkable for 1) unsure  dates, 2) history of preterm labor with a term delivery, 3) history of  postpartum depression, 4) history of STDs, 5) positive anti-D antibody,  6) previous cesarean section with desire for repeat, 7) history of  depression. The patient was taken to the operating room where a repeat  low transverse cesarean section was performed by Dr. Osborn Coho on  April 13, 2007 under spinal anesthesia. Findings were a viable female by  the name of Fredrick, weight 6 pounds 13 ounces, Apgar's were 9 and 10.  The infant was taken to the full-term nursery, mother was taken to  recovery in good condition. By postoperative day #1, the patient was  doing well. She was up ad lib, she was breast and bottle feeding. Her  hemoglobin was 10 down from 12.7, her hemoglobin A1c was 5.9 and her  incision was clean, dry and intact. Her chest was clear. She was  tolerating a regular diet. The rest of the patient's hospital course was  uncomplicated. She  did have Fredrick circumcised. Fasting blood sugar on  postoperative day #1 was 100. She began to tolerate p.o. pain medication  without difficulty and was able to ambulate without difficulty. A social  work consult was obtained secondary to history of substance use and  postpartum depression. No significant acute findings were noted, no  intervention was necessary. By postoperative day #3, the patient was  doing well, she was up ad lib, she was planning Depo-Provera prior to  leaving the hospital for contraception. Her incision was clean, dry and  intact, her fundus was firm. She was deemed to have received full  benefit of her hospital stay and was discharged home.   DISCHARGE INSTRUCTIONS:  Per [facility] handout.   DISCHARGE MEDICATIONS:  1. Motrin 600 mg p.o. q.6 h p.r.n. pain.  2. The patient received Depo-Provera 150 mg IM today and then at her 6      week visit we will make arrangements for her to receive it q.12      weeks in the office.   Discharge followup will occur in 6 weeks  at [facility].      Renaldo Reel Emilee Hero, C.N.M.      Crist Fat Rivard, M.D.  Electronically Signed    VLL/MEDQ  D:  04/16/2007  T:  04/17/2007  Job:  [id number]

## 2011-01-07 NOTE — Op Note (Signed)
NAME:  KEYUNNA, COCO NO.:  [id number]   MEDICAL RECORD NO.:  [id number]          PATIENT TYPE:  INP   LOCATION:  9102                          [facility]   PHYSICIAN:  Osborn Coho, M.D.   DATE OF BIRTH:  [birth year]   DATE OF PROCEDURE:  04/13/2007  DATE OF DISCHARGE:                               OPERATIVE REPORT   PREOPERATIVE DIAGNOSIS:  1. Intrauterine pregnancy at 38 weeks.  2. Gestational diabetes.  3. Elective repeat C-section.  4. Positive history of anti-D antibody.   POSTOPERATIVE DIAGNOSIS:  1. Intrauterine pregnancy at 38 weeks.  2. Gestational diabetes.  3. Elective repeat C-section.  4. Positive history of anti-D antibody.   PROCEDURE:  Repeat C-section.   ATTENDING:  Osborn Coho, M.D.   ANESTHESIA:  Spinal.   SPECIMEN TO PATHOLOGY:  Placenta.   FINDINGS:  Live female infant with Apgars of 9 at 1 and 9 at 10 minutes in  Frederick weight 6 pounds 13 ounces.   FLUIDS:  2800 mL.   URINE OUTPUT:  50 mL   ESTIMATED BLOOD LOSS:  600 mL   COMPLICATIONS:  None.   PROCEDURE:  The patient was taken to the operating room after risks,  benefits and alternatives reviewed with the patient.  The patient  verbalized understanding and consent signed and witnessed.  The patient  was given a spinal per anesthesia and prepped and draped in the normal  sterile fashion in the supine position.  A Pfannenstiel skin incision  was made at the site of prior scar and carried down to the underlying  layer of fascia with the Bovie and scalpel.  The fascia was excised  bilaterally in the midline with the Bovie and extended bilaterally with  the Mayo scissors.  Kocher clamps were placed on the inferior aspect of  fascial incision.  The rectus muscle excised from the fascia.  The same  was done on the superior aspect of the fascial incision.  The muscle  separated in the midline and the peritoneum entered bluntly and extended  manually with some  dissection of small amount of adhesions of omentum to  the anterior abdominal wall on the midline.  The bladder blade was  placed and bladder flap created with the Metzenbaum scissors.  The  uterine incision was made with a scalpel, extended manually with the  bandage scissors.  The infant was delivered in vertex presentation and  oral and nasopharynx were bulb suctioned.  The remainder of the infant  was delivered and cord clamped and cut and the infant handed to the  waiting pediatricians.  The placenta was removed via fundal massage and  uterus cleared of all clots and debris.  The uterine incision was  repaired with 0 Vicryl in a running locked fashion and a second  imbricating layer was performed.  The intra-abdominal cavity was  copiously irrigated and the uterine incision was noted to be hemostatic.  The bilateral ovaries and fallopian tubes appeared to be within normal  limits.  The peritoneum was repaired with 2-0 chromic in a running  fashion.  The fascia was  repaired with 0 Vicryl in a running fashion.  The subcutaneous tissue was irrigated and made hemostatic with the  Bovie.  The subcutaneous tissue was reapproximated using 2-0 plain via  three interrupted stitches.  The skin was reapproximated using 3-0  Monocryl via a subcuticular stitch.  Steri-Strips half inch Steri-Strips  with Benzoin was applied to the incision and sponge, lap and needle  count were correct.  The patient tolerated the procedure well and was  awaiting transfer to recovery room in good condition.      Osborn Coho, M.D.  Electronically Signed     AR/MEDQ  D:  04/13/2007  T:  04/13/2007  Job:  [id number]

## 2011-01-07 NOTE — Op Note (Signed)
NAME:  Tina [name], MCGIRR NO.:  [id number]   MEDICAL RECORD NO.:  [id number]          PATIENT TYPE:  INP   LOCATION:  9139                          [facility]   PHYSICIAN:  Scheryl Darter, MD       DATE OF BIRTH:  1981-01-30   DATE OF PROCEDURE:  DATE OF DISCHARGE:                               OPERATIVE REPORT   PROCEDURE:  Repeat low-transverse cesarean section.   PREOPERATIVE DIAGNOSES:  1. Intrauterine pregnancy 37 weeks 6 days' gestation with Rhesus      isoimmunization.  2. Gestational diabetes.  3. Two previous cesarean sections.   POSTOPERATIVE DIAGNOSIS:  Intrauterine pregnancy at delivery.   SURGEON:  Scheryl Darter, MD   ASSISTANT:  Caren Griffins, CNM   ANESTHESIA:  Spinal.   ESTIMATED BLOOD LOSS:  600 mL.   FINDINGS:  Live born female at 09:30, weight 6 pounds 5 ounces, Apgars 9  and 9.   COMPLICATIONS:  None.   DRAINS:  Foley catheter.   COUNTS:  Correct.   OPERATIVE COURSE:  The patient gave written consent for repeat cesarean  section for 7 weeks estimated gestation.  The patient has been followed  with Rh isoimmunization and Dr. Margot Ables recommended delivery at 38  weeks' gestation due to this diagnosis.  The patient identification was  confirmed.  She was taken to the OR and spinal anesthesia was induced.  She was placed in dorsal supine position in the left lateral tilt.  Abdomen was sterilely prepped and draped.  Foley catheter was placed.  A  #10 blade was used to make a Pfannenstiel incision inside her previous  cesarean section scars.  Incision was carried down to the fascia and the  fascia was incised and incision was extended transversely with Mayo  scissors.  Fascia was separated from underlying tissue attachments with  blunt and sharp dissection.  Bellies of the rectus muscles were  separated.  The underlying peritoneum was elevated and incised with  Metzenbaum scissors.  Incision was extended vertically.  Bladder blade  was  inserted.  Vesicouterine fold was incised with Metzenbaum scissors  and a bladder flap was created.  Bladder blade was reinserted.  Lower  uterine segment was entered in the midline with #10 blade and clear  fluid was expressed.  The incision was extended transversely with blunt  traction.  Fetal head was elevated and delivered.  Mouth and nose were  cleared with bulb suction.  Infant was delivered atraumatically.  Infant  was vigorous at birth and was a live born female at 09:30, Apgars 9 and  9, weight 6 pounds 5 ounces.  Infant was handed to nursery personnel.  The placenta was removed manually and uterus was explored.  The patient  has received IV cefotetan.  She received IV Pitocin after delivering the  baby.  The bladder blade was reinserted.  Lower uterine segment incision  was closed with a running suture with 0 Vicryl.  Imbricating layer  followed.  Hemostasis was obtained at the midline with interrupted  suture.  Good hemostasis was assured.  Pelvic gutters were irrigated.  Both  adnexa appeared normal.  The anterior peritoneum was closed with a  running suture with 2-0 Vicryl.  The fascia was closed with a running  suture with 0 Vicryl.  Good hemostasis was assured in the incision and  the incision was irrigated.  Skin was closed with a running subcuticular  suture with 4-0 Vicryl.  Sterile dressing was applied.  The patient  tolerated the procedure well without complication.  She was brought in  stable condition to recovery.      Scheryl Darter, MD  Electronically Signed     JA/MEDQ  D:  04/25/2009  T:  04/25/2009  Job:  [id number]

## 2011-01-08 ENCOUNTER — Other Ambulatory Visit: Payer: Self-pay | Admitting: Obstetrics & Gynecology

## 2011-01-08 DIAGNOSIS — O2690 Pregnancy related conditions, unspecified, unspecified trimester: Secondary | ICD-10-CM

## 2011-01-10 ENCOUNTER — Other Ambulatory Visit: Payer: Self-pay | Admitting: Obstetrics & Gynecology

## 2011-01-10 ENCOUNTER — Ambulatory Visit (HOSPITAL_COMMUNITY)
Admission: RE | Admit: 2011-01-10 | Discharge: 2011-01-10 | Disposition: A | Discharge status: Home / Self Care | Payer: Medicaid Other | Source: Ambulatory Visit | Attending: Obstetrics & Gynecology | Admitting: Obstetrics & Gynecology

## 2011-01-10 DIAGNOSIS — O2690 Pregnancy related conditions, unspecified, unspecified trimester: Secondary | ICD-10-CM

## 2011-01-10 DIAGNOSIS — O358XX0 Maternal care for other (suspected) fetal abnormality and damage, not applicable or unspecified: Secondary | ICD-10-CM | POA: Insufficient documentation

## 2011-01-10 DIAGNOSIS — Z1389 Encounter for screening for other disorder: Secondary | ICD-10-CM | POA: Insufficient documentation

## 2011-01-10 DIAGNOSIS — Z363 Encounter for antenatal screening for malformations: Secondary | ICD-10-CM | POA: Insufficient documentation

## 2011-01-10 DIAGNOSIS — O360990 Maternal care for other rhesus isoimmunization, unspecified trimester, not applicable or unspecified: Secondary | ICD-10-CM

## 2011-01-10 DIAGNOSIS — O9989 Other specified diseases and conditions complicating pregnancy, childbirth and the puerperium: Secondary | ICD-10-CM | POA: Insufficient documentation

## 2011-01-10 NOTE — Group Therapy Note (Signed)
NAME:  Tina [name], Tina [name] NO.:  [id number]   MEDICAL RECORD NO.:  [id number]                   PATIENT TYPE:  OUT   LOCATION:  [facility]                           FAC[facility]  [facility]   PHYSICIAN:  Argentina Donovan, MD                     DATE OF BIRTH:  [birth year]   DATE OF SERVICE:  04/18/2004                                    CLINIC NOTE   REASON FOR VISIT:  The patient is a 30 year old gravida 1 para 0-0-0-0 with  an EDD of June 16, 2004 - i.e. 31 weeks plus in this pregnancy - who is  sent in for a colposcopy because of an atypical Pap smear that showed CIN-1,  mild dysplasia, with some cells looking like high-risk cells.  The patient  was placed in the dorsal lithotomy position, a Graves speculum placed into  the vagina, and the cervix situation in the mid portion so that it could be  seen.  The transition zone could be seen 360 degrees, was treated with 3%  acetic acid.  Marked acetowhite areas around the entire cervix but no  punctations, atypical vessels, or mosaicism noted.  Marked ectropion with  squamous metaplasia.   IMPRESSION:  Mild cervical dysplasia, cervical intraepithelial neoplasia  grade 1.  No area of biopsies were done.  Would follow this patient up with  a Pap smear and possible recolposcopy at 6 weeks postpartum.                                               Argentina Donovan, MD    PR/MEDQ  D:  04/18/2004  T:  04/18/2004  Job:  [id number]

## 2011-01-10 NOTE — Op Note (Signed)
NAME:  LEASIA, SWANN NO.:  [id number]   MEDICAL RECORD NO.:  [id number]          PATIENT TYPE:  WOC   LOCATION:  WOC                          FACILITY:  [facility]   PHYSICIAN:  Conni Elliot, M.D.DATE OF BIRTH:  05/13/1981   DATE OF PROCEDURE:  06/16/2004  DATE OF DISCHARGE:                                 OPERATIVE REPORT   PREOPERATIVE DIAGNOSIS:  Fetal tachycardia, Rh sensitized, and positive  group B Streptococcus.   POSTOPERATIVE DIAGNOSIS:  Fetal tachycardia, Rh sensitized, and positive  group B Streptococcus.   OPERATION:  Low transverse cesarean.   ANESTHESIA:  Spinal.   OPERATIVE FINDINGS:  6 pound 5 ounce female.  Apgars of 7 and 9.  Cord pH  was 7.28.  Placenta was sent to pathology.  There was some meconium-stained  fluid upon rupture of membranes, and the baby was DeLee suctioned prior to  delivery of the chest.   PROCEDURE:  After placing the patient on spinal anesthetic, the patient was  in the supine left lateral tilt position, and was prepped and draped in a  sterile fashion.  Low transverse Pfannenstiel incision was made and carried  down through the fascia.  The rectus muscles were separated in the midline.  A bladder flap created.  Low transverse uterine incision was made.  Baby's  head was delivered, DeLee suctioned.  The remainder of the body was  delivered.  Cord was clamped and cut.  The infant was handed to the  neonatologist in attendance.  Placenta was delivered spontaneously.  The  uterus, bladder flap, anterior peritoneum, fascia, and skin were closed in  the usual fashion.   ESTIMATED BLOOD LOSS:  Less than 800 cc.      ASG/MEDQ  D:  06/16/2004  T:  06/16/2004  Job:  [id number]

## 2011-01-10 NOTE — Discharge Summary (Signed)
NAME:  Tina [name], MCKNEELY NO.:  [id number]   MEDICAL RECORD NO.:  [id number]          PATIENT TYPE:  INP   LOCATION:  9122                          [facility]   PHYSICIAN:  Conni Elliot, M.D.DATE OF BIRTH:  04-07-1981   DATE OF ADMISSION:  06/15/2004  DATE OF DISCHARGE:                                 DISCHARGE SUMMARY   ADMISSION DIAGNOSIS:  A 30 year old gravida 1 at 80 and 6 with induction of  labor for decreased fetal movement and fetal tachycardia.   DISCHARGE DIAGNOSES:  53.  A 30 year old gravida 1 para 1-0-0-1 postoperative day #3 status post      lower transverse cesarean section secondary to fetal tachycardia.  2.  Rh sensitization.  3.  Viable infant female with Apgars of 7 at one minute and 9 at five      minutes.   DISCHARGE MEDICATIONS:  1.  Ibuprofen 600 mg one p.o. q.6h. p.r.n.  2.  Percocet 5/325 one to two p.o. q.4-6h. p.r.n. #30 no refills.  3.  Prenatal vitamin one p.o. daily.  4.  Depo-Provera 150 mg IM every 3 months.   ADMISSION HISTORY:  [name] was admitted to [facility] after  presenting for decreased fetal movement.  She was noted to have fetal  tachycardia and occasional variable decelerations.  She was admitted for  cervical ripening and induction.   #1 - INDUCTION OF LABOR.  Her induction progressed slowly.  She was given  Cervidil which had to be removed due to increased fetal tachycardia and  variable decelerations.  The fetal heart tracing never looked reassuring  enough to start Pitocin and the patient was taken for lower transverse C-  section.  Please see that operative note.   #2 - POSTOPERATIVE COURSE.  Her postoperative course was uncomplicated.  Her  postoperative hemoglobin was 11.7.  She is Rh negative with positive anti-D  antibody and was followed in high risk clinic.  The patient did well  regardless.   CONDITION ON DISCHARGE:  The patient was discharged to home in stable  condition.   INSTRUCTIONS GIVEN TO PATIENT:  The patient was told of the above medical  regimen.  She will follow up with [facility] in 6 weeks.  Again, she was  given no RhoGAM secondary to the anti-D autoimmunization.    LC/MEDQ  D:  06/19/2004  T:  06/19/2004  Job:  [id number]   cc:   [facility] on [employer]

## 2011-01-16 ENCOUNTER — Other Ambulatory Visit: Payer: Self-pay | Admitting: Obstetrics and Gynecology

## 2011-01-16 DIAGNOSIS — O99330 Smoking (tobacco) complicating pregnancy, unspecified trimester: Secondary | ICD-10-CM

## 2011-01-16 DIAGNOSIS — Z331 Pregnant state, incidental: Secondary | ICD-10-CM

## 2011-01-16 DIAGNOSIS — O34219 Maternal care for unspecified type scar from previous cesarean delivery: Secondary | ICD-10-CM

## 2011-01-16 LAB — POCT URINALYSIS DIP (DEVICE)
Bilirubin Urine: NEGATIVE
Glucose, UA: NEGATIVE mg/dL
Ketones, ur: NEGATIVE mg/dL
Protein, ur: NEGATIVE mg/dL

## 2011-01-17 ENCOUNTER — Ambulatory Visit (HOSPITAL_COMMUNITY): Payer: Medicaid Other

## 2011-01-23 ENCOUNTER — Other Ambulatory Visit: Payer: Self-pay | Admitting: Obstetrics and Gynecology

## 2011-01-23 DIAGNOSIS — O99330 Smoking (tobacco) complicating pregnancy, unspecified trimester: Secondary | ICD-10-CM

## 2011-01-23 DIAGNOSIS — Z331 Pregnant state, incidental: Secondary | ICD-10-CM

## 2011-01-23 DIAGNOSIS — O34219 Maternal care for unspecified type scar from previous cesarean delivery: Secondary | ICD-10-CM

## 2011-01-23 LAB — POCT URINALYSIS DIP (DEVICE)
Glucose, UA: NEGATIVE mg/dL
Specific Gravity, Urine: 1.015 (ref 1.005–1.030)
Urobilinogen, UA: 0.2 mg/dL (ref 0.0–1.0)

## 2011-01-24 ENCOUNTER — Other Ambulatory Visit: Payer: Self-pay | Admitting: Obstetrics & Gynecology

## 2011-01-24 ENCOUNTER — Ambulatory Visit (HOSPITAL_COMMUNITY)
Admission: RE | Admit: 2011-01-24 | Discharge: 2011-01-24 | Disposition: A | Discharge status: Home / Self Care | Payer: Medicaid Other | Source: Ambulatory Visit | Attending: Obstetrics & Gynecology | Admitting: Obstetrics & Gynecology

## 2011-01-24 DIAGNOSIS — Z3689 Encounter for other specified antenatal screening: Secondary | ICD-10-CM | POA: Insufficient documentation

## 2011-01-24 DIAGNOSIS — O2690 Pregnancy related conditions, unspecified, unspecified trimester: Secondary | ICD-10-CM

## 2011-01-31 ENCOUNTER — Ambulatory Visit (HOSPITAL_COMMUNITY)
Admission: RE | Admit: 2011-01-31 | Discharge: 2011-01-31 | Disposition: A | Discharge status: Home / Self Care | Payer: Medicaid Other | Source: Ambulatory Visit | Attending: Obstetrics & Gynecology | Admitting: Obstetrics & Gynecology

## 2011-01-31 ENCOUNTER — Other Ambulatory Visit: Payer: Self-pay | Admitting: Obstetrics & Gynecology

## 2011-01-31 ENCOUNTER — Ambulatory Visit (HOSPITAL_COMMUNITY): Payer: Medicaid Other

## 2011-01-31 ENCOUNTER — Inpatient Hospital Stay (HOSPITAL_COMMUNITY)
Admission: AD | Admit: 2011-01-31 | Discharge: 2011-02-03 | DRG: 782 | Disposition: A | Discharge status: Home / Self Care | Payer: Medicaid Other | Source: Ambulatory Visit | Attending: Obstetrics & Gynecology | Admitting: Obstetrics & Gynecology

## 2011-01-31 DIAGNOSIS — O360990 Maternal care for other rhesus isoimmunization, unspecified trimester, not applicable or unspecified: Secondary | ICD-10-CM | POA: Insufficient documentation

## 2011-01-31 DIAGNOSIS — O358XX0 Maternal care for other (suspected) fetal abnormality and damage, not applicable or unspecified: Secondary | ICD-10-CM | POA: Diagnosis present

## 2011-01-31 DIAGNOSIS — O2690 Pregnancy related conditions, unspecified, unspecified trimester: Secondary | ICD-10-CM

## 2011-01-31 DIAGNOSIS — Z3689 Encounter for other specified antenatal screening: Secondary | ICD-10-CM | POA: Insufficient documentation

## 2011-01-31 LAB — CBC
MCV: 90 fL (ref 78.0–100.0)
Platelets: 122 10*3/uL — ABNORMAL LOW (ref 150–400)
RBC: 3.51 MIL/uL — ABNORMAL LOW (ref 3.87–5.11)
WBC: 8.5 10*3/uL (ref 4.0–10.5)

## 2011-02-01 ENCOUNTER — Inpatient Hospital Stay (HOSPITAL_COMMUNITY): Payer: Medicaid Other

## 2011-02-01 LAB — RAPID URINE DRUG SCREEN, HOSP PERFORMED
Amphetamines: NOT DETECTED
Benzodiazepines: NOT DETECTED
Opiates: NOT DETECTED

## 2011-02-01 LAB — HIV ANTIBODY (ROUTINE TESTING W REFLEX): HIV: NONREACTIVE

## 2011-02-02 ENCOUNTER — Inpatient Hospital Stay (HOSPITAL_COMMUNITY): Payer: Medicaid Other

## 2011-02-03 ENCOUNTER — Inpatient Hospital Stay (HOSPITAL_COMMUNITY): Payer: Medicaid Other

## 2011-02-04 LAB — GC/CHLAMYDIA PROBE AMP, GENITAL: GC Probe Amp, Genital: NEGATIVE

## 2011-02-04 NOTE — Discharge Summary (Addendum)
  Tina [name], Tina [name] NO.:  [id number]  MEDICAL RECORD NO.:  [id number]  LOCATION:  9153                          [facility]  PHYSICIAN:  Horton Chin, MD DATE OF BIRTH:  [birth year]  DATE OF ADMISSION:  01/31/2011 DATE OF DISCHARGE:  02/03/2011                              DISCHARGE SUMMARY   ADMISSION DIAGNOSES: 1. Intrauterine pregnancy at 33 weeks and 0 days. 2. Rh alloimmunization with elevated MCA Dopplers. 3. Fetal pericardial effusion.  DISCHARGE DIAGNOSES: 1. Intrauterine pregnancy at 33 weeks and 3 days. 2. Rh alloimmunization with elevated MCA Dopplers. 3. Fetal pericardial effusion.  FELLOW:  Maryelizabeth Kaufmann, MD  ATTENDING:  Horton Chin, MD  DISCHARGE MEDICATIONS:  None.  CONSULTATION:  Maternal Fetal Medicine.  PROCEDURES:  None.  ULTRASOUND:  The patient did have a repeat biophysical profiles daily and it returned 8/10 on February 01, 2011, and 10/10 on February 02, 2011, and then, she had a BPP of 8/10 on February 03, 2011, and stable MCA Dopplers.  HOSPITAL COURSE:  This is a 30 year old gravida 4, para 3-0-03 with the intrauterine pregnancy at 33 weeks and 0 days with a history of Rh alloimmunization with the previous pregnancy being followed in [facility].  The patient was anti-D positive also being followed by Maternal Fetal Medicine and on the day of admission, she had elevated MCA Dopplers and a fetal pericardial effusion and MFM recommended inpatient observation with daily BPPs and repeat Dopplers on Monday.  PAST MEDICAL HISTORY:  Notable for 3 previous C-sections, substance abuse, notable for cocaine and THC.  She does have a history of gestational diabetes with her second and third pregnancies.  She has a history of PID with Chlamydia in 2008, trichomonas in 2012, and LGSIL Pap smear.  HOSPITAL COURSE:  Her hospital course was uncomplicated. BPP and  ultrasound findings are as noted.  MCA Dopplers remained  stable and MFM recommended discharge with follow up in 2 days and the patient was discharged home in stable condition.  DISPOSITION:  Discharged to home.  DISCHARGE CONDITION:  Stable.  FOLLOWUP:  The patient is to follow up with Maternal Fetal Medicine on February 05, 2011, and she will follow up with [facility] on February 06, 2011. She was advised to return to the MAU for any fever, chills, nausea, vomiting, bleeding, decreased fetal movement, leakage of fluids, or any other concerning symptoms.    ______________________________ Maryelizabeth Kaufmann, MD   ______________________________ Horton Chin, MD    Tina [name]  D:  02/03/2011  T:  02/03/2011  Job:  [id number]  Electronically Signed by Jaynie Collins MD on 02/04/2011 05:30:51 PM Electronically Signed by Maryelizabeth Kaufmann MD on 02/05/2011 09:03:32 PM

## 2011-02-05 ENCOUNTER — Ambulatory Visit (HOSPITAL_COMMUNITY)
Admit: 2011-02-05 | Discharge: 2011-02-05 | Disposition: A | Discharge status: Home / Self Care | Payer: Medicaid Other | Attending: Obstetrics & Gynecology | Admitting: Obstetrics & Gynecology

## 2011-02-05 ENCOUNTER — Other Ambulatory Visit (HOSPITAL_COMMUNITY): Payer: Medicaid Other

## 2011-02-05 DIAGNOSIS — Z3689 Encounter for other specified antenatal screening: Secondary | ICD-10-CM | POA: Insufficient documentation

## 2011-02-05 DIAGNOSIS — O360990 Maternal care for other rhesus isoimmunization, unspecified trimester, not applicable or unspecified: Secondary | ICD-10-CM | POA: Insufficient documentation

## 2011-02-06 ENCOUNTER — Other Ambulatory Visit: Payer: Self-pay | Admitting: Obstetrics & Gynecology

## 2011-02-06 DIAGNOSIS — O360190 Maternal care for anti-D [Rh] antibodies, unspecified trimester, not applicable or unspecified: Secondary | ICD-10-CM

## 2011-02-07 ENCOUNTER — Ambulatory Visit (HOSPITAL_COMMUNITY): Payer: Medicaid Other

## 2011-02-08 ENCOUNTER — Inpatient Hospital Stay (HOSPITAL_COMMUNITY)
Admission: AD | Admit: 2011-02-08 | Discharge: 2011-02-08 | Disposition: A | Discharge status: Home / Self Care | Payer: Medicaid Other | Source: Ambulatory Visit | Attending: Obstetrics & Gynecology | Admitting: Obstetrics & Gynecology

## 2011-02-08 DIAGNOSIS — O99891 Other specified diseases and conditions complicating pregnancy: Secondary | ICD-10-CM | POA: Insufficient documentation

## 2011-02-08 DIAGNOSIS — M5380 Other specified dorsopathies, site unspecified: Secondary | ICD-10-CM

## 2011-02-08 DIAGNOSIS — O9989 Other specified diseases and conditions complicating pregnancy, childbirth and the puerperium: Secondary | ICD-10-CM

## 2011-02-09 LAB — STREP A DNA PROBE

## 2011-02-14 ENCOUNTER — Ambulatory Visit (HOSPITAL_COMMUNITY): Payer: Medicaid Other

## 2011-02-14 ENCOUNTER — Ambulatory Visit (HOSPITAL_COMMUNITY)
Admission: RE | Admit: 2011-02-14 | Discharge: 2011-02-14 | Disposition: A | Discharge status: Home / Self Care | Payer: Medicaid Other | Source: Ambulatory Visit | Attending: Obstetrics & Gynecology | Admitting: Obstetrics & Gynecology

## 2011-02-14 ENCOUNTER — Other Ambulatory Visit: Payer: Self-pay | Admitting: Obstetrics & Gynecology

## 2011-02-14 DIAGNOSIS — Z3689 Encounter for other specified antenatal screening: Secondary | ICD-10-CM | POA: Insufficient documentation

## 2011-02-14 DIAGNOSIS — O2690 Pregnancy related conditions, unspecified, unspecified trimester: Secondary | ICD-10-CM

## 2011-02-14 DIAGNOSIS — R6252 Short stature (child): Secondary | ICD-10-CM

## 2011-02-14 DIAGNOSIS — O360190 Maternal care for anti-D [Rh] antibodies, unspecified trimester, not applicable or unspecified: Secondary | ICD-10-CM

## 2011-02-20 ENCOUNTER — Other Ambulatory Visit: Payer: Self-pay | Admitting: Physician Assistant

## 2011-02-20 DIAGNOSIS — O34219 Maternal care for unspecified type scar from previous cesarean delivery: Secondary | ICD-10-CM

## 2011-02-20 DIAGNOSIS — O99330 Smoking (tobacco) complicating pregnancy, unspecified trimester: Secondary | ICD-10-CM

## 2011-02-20 LAB — POCT URINALYSIS DIP (DEVICE)
Bilirubin Urine: NEGATIVE
Hgb urine dipstick: NEGATIVE
Nitrite: NEGATIVE
Specific Gravity, Urine: 1.025 (ref 1.005–1.030)
Urobilinogen, UA: 1 mg/dL (ref 0.0–1.0)
pH: 6 (ref 5.0–8.0)

## 2011-02-21 ENCOUNTER — Other Ambulatory Visit (HOSPITAL_COMMUNITY): Payer: Medicaid Other

## 2011-02-21 ENCOUNTER — Ambulatory Visit (HOSPITAL_COMMUNITY): Admission: RE | Admit: 2011-02-21 | Payer: Medicaid Other | Source: Ambulatory Visit

## 2011-02-24 ENCOUNTER — Other Ambulatory Visit: Payer: Medicaid Other

## 2011-02-25 ENCOUNTER — Other Ambulatory Visit (HOSPITAL_COMMUNITY): Payer: Medicaid Other

## 2011-02-27 ENCOUNTER — Ambulatory Visit (HOSPITAL_COMMUNITY): Payer: Medicaid Other

## 2011-02-27 ENCOUNTER — Encounter (HOSPITAL_COMMUNITY)
Admission: RE | Admit: 2011-02-27 | Discharge: 2011-02-27 | Disposition: A | Discharge status: Home / Self Care | Payer: Medicaid Other | Source: Ambulatory Visit | Attending: Obstetrics and Gynecology | Admitting: Obstetrics and Gynecology

## 2011-02-27 ENCOUNTER — Ambulatory Visit (HOSPITAL_COMMUNITY)
Admission: RE | Admit: 2011-02-27 | Discharge: 2011-02-27 | Disposition: A | Discharge status: Home / Self Care | Payer: Medicaid Other | Source: Ambulatory Visit | Attending: Obstetrics & Gynecology | Admitting: Obstetrics & Gynecology

## 2011-02-27 ENCOUNTER — Other Ambulatory Visit: Payer: Self-pay | Admitting: Obstetrics & Gynecology

## 2011-02-27 ENCOUNTER — Other Ambulatory Visit: Payer: Medicaid Other

## 2011-02-27 DIAGNOSIS — R6252 Short stature (child): Secondary | ICD-10-CM

## 2011-02-27 DIAGNOSIS — O361190 Maternal care for Anti-A sensitization, unspecified trimester, not applicable or unspecified: Secondary | ICD-10-CM | POA: Insufficient documentation

## 2011-02-27 LAB — CBC
Hemoglobin: 11.1 g/dL — ABNORMAL LOW (ref 12.0–15.0)
MCH: 29.7 pg (ref 26.0–34.0)
MCHC: 32.5 g/dL (ref 30.0–36.0)
MCV: 91.4 fL (ref 78.0–100.0)

## 2011-02-27 LAB — SURGICAL PCR SCREEN
MRSA, PCR: NEGATIVE
Staphylococcus aureus: POSITIVE — AB

## 2011-02-27 LAB — RPR: RPR Ser Ql: NONREACTIVE

## 2011-02-28 ENCOUNTER — Other Ambulatory Visit (HOSPITAL_COMMUNITY): Payer: Medicaid Other

## 2011-02-28 ENCOUNTER — Inpatient Hospital Stay (HOSPITAL_COMMUNITY): Payer: Medicaid Other | Admitting: Obstetrics and Gynecology

## 2011-02-28 ENCOUNTER — Inpatient Hospital Stay (HOSPITAL_COMMUNITY)
Admission: RE | Admit: 2011-02-28 | Discharge: 2011-03-03 | DRG: 766 | Disposition: A | Discharge status: Home / Self Care | Payer: Medicaid Other | Source: Ambulatory Visit | Attending: Obstetrics and Gynecology | Admitting: Obstetrics and Gynecology

## 2011-02-28 ENCOUNTER — Inpatient Hospital Stay (HOSPITAL_COMMUNITY): Admission: RE | Admit: 2011-02-28 | Payer: Medicaid Other | Source: Ambulatory Visit

## 2011-02-28 DIAGNOSIS — Z3480 Encounter for supervision of other normal pregnancy, unspecified trimester: Secondary | ICD-10-CM

## 2011-02-28 DIAGNOSIS — O34219 Maternal care for unspecified type scar from previous cesarean delivery: Secondary | ICD-10-CM

## 2011-02-28 DIAGNOSIS — O360990 Maternal care for other rhesus isoimmunization, unspecified trimester, not applicable or unspecified: Secondary | ICD-10-CM

## 2011-02-28 MED ORDER — IBUPROFEN 600 MG PO TABS: 600.0000 mg | ORAL_TABLET | Freq: Four times a day (QID) | ORAL | Status: DC | PRN

## 2011-02-28 MED ORDER — NICOTINE 21 MG/24HR TD PT24
21.0000 mg | MEDICATED_PATCH | Freq: Every day | TRANSDERMAL | Status: DC | PRN
  Filled 2011-02-28: qty 1

## 2011-02-28 MED ORDER — SODIUM CHLORIDE 0.9 % IJ SOLN
3.0000 mL | INTRAMUSCULAR | Status: DC | PRN
  Filled 2011-02-28: qty 3

## 2011-02-28 MED ORDER — SIMETHICONE 80 MG PO CHEW
80.0000 mg | CHEWABLE_TABLET | Freq: Three times a day (TID) | ORAL | Status: DC
  Administered 2011-03-01 – 2011-03-03 (×9): 80 mg via ORAL
  Filled 2011-02-28 (×6): qty 1

## 2011-02-28 MED ORDER — DIPHENHYDRAMINE HCL 25 MG PO CAPS: 25.0000 mg | ORAL_CAPSULE | Freq: Four times a day (QID) | ORAL | Status: DC | PRN

## 2011-02-28 MED ORDER — NALBUPHINE HCL 10 MG/ML IJ SOLN
5.0000 mg | INTRAMUSCULAR | Status: DC | PRN
  Filled 2011-02-28: qty 1

## 2011-02-28 MED ORDER — DIPHENHYDRAMINE HCL 50 MG/ML IJ SOLN: 12.5000 mg | INTRAMUSCULAR | Status: DC | PRN

## 2011-02-28 MED ORDER — HYDROCORTISONE ACE-PRAMOXINE 2.5-1 % RE CREA
TOPICAL_CREAM | RECTAL | Status: DC | PRN
  Filled 2011-02-28: qty 30

## 2011-02-28 MED ORDER — DIPHENHYDRAMINE HCL 50 MG/ML IJ SOLN: 25.0000 mg | INTRAMUSCULAR | Status: DC | PRN

## 2011-02-28 MED ORDER — NALOXONE HCL 0.4 MG/ML IJ SOLN: 0.1000 mg | INTRAMUSCULAR | Status: DC | PRN

## 2011-02-28 MED ORDER — SODIUM CHLORIDE 0.9 % IJ SOLN
3.0000 mL | Freq: Two times a day (BID) | INTRAMUSCULAR | Status: DC
  Filled 2011-02-28 (×3): qty 3

## 2011-02-28 MED ORDER — SENNOSIDES-DOCUSATE SODIUM 8.6-50 MG PO TABS
1.0000 | ORAL_TABLET | Freq: Every day | ORAL | Status: DC
  Administered 2011-03-01 – 2011-03-02 (×2): 1 via ORAL
  Filled 2011-02-28 (×3): qty 2

## 2011-02-28 MED ORDER — ONDANSETRON HCL 4 MG/2ML IJ SOLN: 4.0000 mg | Freq: Three times a day (TID) | INTRAMUSCULAR | Status: DC | PRN

## 2011-02-28 MED ORDER — HYDROCORTISONE 1 % EX OINT
TOPICAL_OINTMENT | Freq: Four times a day (QID) | CUTANEOUS | Status: DC | PRN
  Filled 2011-02-28: qty 28.35

## 2011-02-28 MED ORDER — GUAIFENESIN 100 MG/5ML PO SOLN: 15.0000 mL | ORAL | Status: DC | PRN

## 2011-02-28 MED ORDER — MENTHOL 3 MG MT LOZG: 1.0000 | LOZENGE | OROMUCOSAL | Status: DC | PRN

## 2011-02-28 MED ORDER — NALOXONE HCL 0.4 MG/ML IJ SOLN: 0.4000 mg | INTRAMUSCULAR | Status: DC | PRN

## 2011-02-28 MED ORDER — WITCH HAZEL-GLYCERIN EX PADS
MEDICATED_PAD | CUTANEOUS | Status: DC | PRN
  Filled 2011-02-28: qty 100

## 2011-02-28 MED ORDER — KETOROLAC TROMETHAMINE 30 MG/ML IJ SOLN: 30.0000 mg | INTRAMUSCULAR | Status: DC | PRN

## 2011-02-28 MED ORDER — ONDANSETRON HCL 4 MG PO TABS: 4.0000 mg | ORAL_TABLET | ORAL | Status: DC | PRN

## 2011-02-28 MED ORDER — SIMETHICONE 80 MG PO CHEW
80.0000 mg | CHEWABLE_TABLET | ORAL | Status: DC | PRN
  Administered 2011-03-02: 80 mg via ORAL
  Filled 2011-02-28: qty 1

## 2011-02-28 MED ORDER — NALOXONE HCL 0.4 MG/ML IJ SOLN
1.0000 ug/kg/h | INTRAMUSCULAR | Status: DC
  Filled 2011-02-28: qty 2.5

## 2011-02-28 MED ORDER — METOCLOPRAMIDE HCL 5 MG/ML IJ SOLN: 10.0000 mg | Freq: Three times a day (TID) | INTRAMUSCULAR | Status: DC | PRN

## 2011-02-28 MED ORDER — FLEET ENEMA 7-19 GM/118ML RE ENEM
1.0000 | ENEMA | RECTAL | Status: DC | PRN
  Filled 2011-02-28: qty 1

## 2011-02-28 MED ORDER — LACTATED RINGERS IV SOLN: INTRAVENOUS | Status: DC

## 2011-02-28 MED ORDER — ZOLPIDEM TARTRATE 5 MG PO TABS: 5.0000 mg | ORAL_TABLET | Freq: Every evening | ORAL | Status: DC | PRN

## 2011-02-28 MED ORDER — BISACODYL 10 MG RE SUPP
10.0000 mg | Freq: Every day | RECTAL | Status: DC | PRN
Start: 2011-03-01 — End: 2011-03-03

## 2011-02-28 MED ORDER — MUPIROCIN 2 % EX OINT
TOPICAL_OINTMENT | Freq: Two times a day (BID) | CUTANEOUS | Status: DC
  Administered 2011-02-28 – 2011-03-03 (×5): via NASAL

## 2011-02-28 MED ORDER — CHLORHEXIDINE GLUCONATE CLOTH 2 % EX PADS
6.0000 | MEDICATED_PAD | Freq: Every day | CUTANEOUS | Status: DC
  Administered 2011-03-01: 6 via TOPICAL
  Filled 2011-02-28 (×4): qty 6

## 2011-02-28 MED ORDER — PSEUDOEPHEDRINE HCL 30 MG PO TABS: 60.0000 mg | ORAL_TABLET | ORAL | Status: DC | PRN

## 2011-02-28 MED ORDER — DIPHENHYDRAMINE HCL 25 MG PO CAPS: 25.0000 mg | ORAL_CAPSULE | ORAL | Status: DC | PRN

## 2011-02-28 MED ORDER — MAGNESIUM HYDROXIDE 400 MG/5ML PO SUSP: 30.0000 mL | ORAL | Status: DC | PRN

## 2011-02-28 MED ORDER — CALCIUM CARBONATE ANTACID 500 MG PO CHEW: 1.0000 | CHEWABLE_TABLET | ORAL | Status: DC | PRN

## 2011-02-28 MED ORDER — OXYCODONE-ACETAMINOPHEN 5-325 MG PO TABS
1.0000 | ORAL_TABLET | ORAL | Status: DC | PRN
  Administered 2011-03-01: 1 via ORAL
  Administered 2011-03-01 (×3): 2 via ORAL
  Administered 2011-03-01 (×2): 1 via ORAL
  Administered 2011-03-01 – 2011-03-02 (×2): 2 via ORAL
  Administered 2011-03-02 (×2): 1 via ORAL
  Administered 2011-03-02 – 2011-03-03 (×6): 2 via ORAL
  Filled 2011-02-28 (×2): qty 2
  Filled 2011-02-28: qty 1
  Filled 2011-02-28 (×2): qty 2
  Filled 2011-02-28: qty 1
  Filled 2011-02-28 (×5): qty 2
  Filled 2011-02-28: qty 1
  Filled 2011-02-28 (×2): qty 2

## 2011-02-28 MED ORDER — NALBUPHINE SYRINGE 5 MG/0.5 ML
5.0000 mg | INJECTION | INTRAMUSCULAR | Status: DC | PRN
  Filled 2011-02-28: qty 1

## 2011-02-28 MED ORDER — DIPHENHYDRAMINE HCL 25 MG PO CAPS
25.0000 mg | ORAL_CAPSULE | ORAL | Status: DC | PRN
  Administered 2011-03-01: 25 mg via ORAL

## 2011-02-28 MED ORDER — DIBUCAINE 1 % RE OINT
TOPICAL_OINTMENT | RECTAL | Status: DC | PRN
  Administered 2011-03-01: 18:00:00 via RECTAL

## 2011-02-28 MED ORDER — IBUPROFEN 600 MG PO TABS
600.0000 mg | ORAL_TABLET | Freq: Four times a day (QID) | ORAL | Status: DC
  Administered 2011-03-01 – 2011-03-03 (×11): 600 mg via ORAL
  Filled 2011-02-28 (×10): qty 1

## 2011-02-28 MED ORDER — ONDANSETRON HCL 4 MG/2ML IJ SOLN: 4.0000 mg | INTRAMUSCULAR | Status: DC | PRN

## 2011-03-01 LAB — CBC
Hemoglobin: 9.4 g/dL — ABNORMAL LOW (ref 12.0–15.0)
RBC: 3.11 MIL/uL — ABNORMAL LOW (ref 3.87–5.11)
WBC: 8.4 10*3/uL (ref 4.0–10.5)

## 2011-03-01 NOTE — Progress Notes (Signed)
  S:  No c/o.  Pain well controlled.  Breast feeding well.  No flatus O:  VSS.  Labs pending.  HRRR.  LCTAB.  Abd soft, nontender.  + Bowel sounds. Dsg dry/intact.  FF @ u-2.  Lochia small.  Legs negative A:  POD#1, G4P4004, RLTCS.  Stable P: expectant management

## 2011-03-01 NOTE — Progress Notes (Signed)
Patient requested the nicotine patch be removed so that she could go outside and smoke

## 2011-03-02 MED ORDER — FERROUS SULFATE 325 (65 FE) MG PO TABS: 325.0000 mg | ORAL_TABLET | Freq: Every day | ORAL | Status: DC

## 2011-03-02 MED ORDER — DOCUSATE SODIUM 100 MG PO CAPS: 100.0000 mg | ORAL_CAPSULE | Freq: Two times a day (BID) | ORAL | Status: DC

## 2011-03-02 MED ORDER — OXYCODONE-ACETAMINOPHEN 5-325 MG PO TABS: 1.0000 | ORAL_TABLET | ORAL | Status: AC | PRN

## 2011-03-02 MED ORDER — IBUPROFEN 600 MG PO TABS: 600.0000 mg | ORAL_TABLET | Freq: Four times a day (QID) | ORAL | Status: AC

## 2011-03-02 NOTE — Discharge Summary (Deleted)
Obstetric Discharge Summary Reason for Admission: cesarean section due to elevated dopplers from isoimmunization Prenatal Procedures: NST, dopplers, [place] Intrapartum Procedures: cesarean: low cervical, transverse Postpartum Procedures: none Complications-Operative and Postpartum: none  Hemoglobin  Date Value Range Status  03/01/2011 9.4* 12.0-15.0 (g/dL) Final     HCT  Date Value Range Status  03/01/2011 28.0* 36.0-46.0 (%) Final    Discharge Diagnoses: cesarean section due to isoimmunization  Discharge Information: Date: 03/02/2011 Activity: pelvic rest, no heavy lifting for 6 weeks Diet: routine Medications: Ibuprophen, Colace, Iron and Percocet Condition: stable Instructions: refer to practice specific booklet Discharge to: home   Newborn Data: Live born  Information for the patient's newborn:  Shenea, Giacobbe [[id number]]  female ; APGAR , ; weight ;  Home with mother.  Tina [name] H. 03/02/2011, 10:35 AM

## 2011-03-02 NOTE — Progress Notes (Signed)
Subjective: Postpartum Day 2: Cesarean Delivery Patient reports tolerating PO, + flatus and no problems voiding.  Breastfeeding and bottle feeding. Contraception: interested in implanon. Expresses having been prescribed med for yeast infection. Wants med for it.  Objective: Vital signs in last 24 hours: Temp:  [98 F (36.7 C)-98.6 F (37 C)] 98 F (36.7 C) (07/08 0544) Pulse Rate:  [87-93] 89  (07/08 0544) Resp:  [18-19] 18  (07/08 0544) BP: (99-116)/(64-76) 99/64 mmHg (07/08 0544)  Physical Exam:  General: alert and cooperative Lochia: appropriate Uterine Fundus: firm Incision: healing well DVT Evaluation: No evidence of DVT seen on physical exam. Negative Homan's sign.   Basename 03/01/11 1827  HGB 9.4*  HCT 28.0*    Assessment/Plan: Status post Cesarean section. Doing well postoperatively.  Discharge home with standard precautions and return to clinic in 4-6 weeks. Contraception: implanon at [facility] dep Bottle and breastfeeding but no breastfeeding with cocaine use. Circumcision for baby boy done as outpatient. Pain controlled with Percocet.   Ninfa Giannelli H. 03/02/2011, 10:20 AM  Marena Chancy, MD PGY1 03/02/2011 at 10:27am

## 2011-03-03 LAB — CROSSMATCH
ABO/RH(D): A NEG
DAT, IgG: NEGATIVE
Unit division: 0

## 2011-03-03 NOTE — Plan of Care (Signed)
Problem: Discharge Progression Outcomes Goal: Remove staples per MD order Outcome: Not Applicable Smart start nurse to remove on POD#7

## 2011-03-03 NOTE — Progress Notes (Signed)
  S: Pt doing well. Pain controlled: Yes. Lochia: decreased.  Eating/drinking: Yes. Flatus: Yes. BM: Yes. Voiding: Yes. Ambulating: Yes. Breast feeding well: Yes. Incisional drainage: none  O:  Filed Vitals:   03/03/11 0520  BP: 103/67  Pulse: 81  Temp: 98.2 F (36.8 C)  Resp: 20    Gen: NAD, doing well CV: RRR Pulm: CTAB Abd: soft, + bowel sounds, FF 1cm below umbilicus, incision clean, dry, intact Ext: WWP, no edema  A/P: 30 year old G66P4 POD# 3 s/p RLTCS w/o complications -female/ circumcision Yes outpatient/ breast and bottle/ birth control: IUD placement -Continue routine post-op care. -Anticipate d/c POD #3, staple removal POD#7 by baby love nurse. -F/u in 6 weeks at [facility]

## 2011-03-03 NOTE — Progress Notes (Signed)
Mom predominantly formula-feeding.  Says has no questions in regards to breastfeeding. Dalan Cowger [employer].

## 2011-03-03 NOTE — Progress Notes (Signed)
Tina [name] [facility] BSN - ur chart review

## 2011-03-05 NOTE — Discharge Summary (Signed)
Physician Discharge Summary  Patient ID: Tina [name] MRN: [id number] DOB/AGE: 28/12/1980 28 y.o.  Admit date: 02/28/2011 Discharge date: 03/05/2011  Admission Diagnoses: Pregnancy  Discharge Diagnoses:  -S/p RLTCS  -[id number]    Discharged Condition: good  Hospital Course: Pt was admitted for scheduled repeat LTCS as this was her 4th C/S.  C/S was preformed without complications producing viable female infance, outpatient circ desired. Mom was transferred to PACU and then [facility]. No post-op complications occurred.  Mom was d/c'ed on POD#3 and will have stapled removed on POD#7 by [facility].  Mom plans to both breast and bottle feed.  Initially on admission, mom has desired BTL, however at the time of the C/S, she changed her mind and will instead use Mirena IUD for birth control.  Consults: none   Discharge Exam: Blood pressure 103/67, pulse 81, temperature 98.2 F (36.8 C), temperature source Oral, resp. rate 20, weight 159 lb 13.3 oz (72.5 kg), SpO2 99.00%. NAD, FF@ umb, incision c/d/i, staples in place  Disposition: Home or Self Care  Discharge Orders    Future Orders Please Complete By Expires   Diet general      Sexual acrtivity      Comments:   No sexual activity for 6 weeks   Discharge instructions      Activity as tolerated      No dressing needed      Call MD for:  temperature >100.4      Call MD for:  severe uncontrolled pain      Call MD for:  redness, tenderness, or signs of infection (pain, swelling, redness, odor or green/yellow discharge around incision site)      Call MD for:  difficulty breathing, headache or visual disturbances      Call MD for:  persistant dizziness or light-headedness        Discharge Medication List as of 03/03/2011 11:27 AM    START taking these medications   Details  docusate sodium (COLACE) 100 MG capsule Take 1 capsule (100 mg total) by mouth 2 (two) times daily., Starting 03/02/2011, Until Mon 03/01/12, Normal    ferrous sulfate 325  (65 FE) MG tablet Take 1 tablet (325 mg total) by mouth daily., Starting 03/02/2011, Until Mon 03/01/12, Normal    ibuprofen (ADVIL,MOTRIN) 600 MG tablet Take 1 tablet (600 mg total) by mouth every 6 (six) hours., Starting 03/02/2011, Until Wed 03/12/11, Normal    oxyCODONE-acetaminophen (PERCOCET) 5-325 MG per tablet Take 1-2 tablets by mouth every 4 (four) hours as needed., Starting 03/02/2011, Until Wed 03/12/11, Print       Follow-up Information    Make an appointment with [facility].   Contact information:   [address] [place] [address]          SignedDemetria Pore 03/05/2011, 6:41 AM

## 2011-03-06 NOTE — Op Note (Signed)
NAME:  LOVENE, MARET NO.:  [id number]  MEDICAL RECORD NO.:  [id number]  LOCATION:  9125                          FACILITY:  [facility]  PHYSICIAN:  Catalina Antigua, MD     DATE OF BIRTH:  [birth year]  DATE OF PROCEDURE:  02/28/2011 DATE OF DISCHARGE:                              OPERATIVE REPORT   PREOPERATIVE DIAGNOSIS:  A 30 year old, G4, P3 at 37 weeks with Rh isoimmunization, and elevated middle cerebral arterial Dopplers.  POSTOPERATIVE DIAGNOSIS:  A 30 year old, G4, P3 at 37 weeks with Rh isoimmunization, and elevated middle cerebral arterial Dopplers.  PROCEDURE:  Repeat low transverse C-section.  SURGEON:  Catalina Antigua, MD  ASSISTANT:  None.  ANESTHESIA:  Spinal.  INTRAVENOUS FLUIDS:  1000 mL.  URINE OUTPUT:  100 mL.  ESTIMATED BLOOD LOSS:  1000 mL.  FINDINGS:  A viable female infant with Apgars 8 and 9, weight of 6 pounds and 5 ounces, clear fluid.  Grossly normal uterus, tubes, and ovaries x2.  Specimen collected was placenta and was sent to L and D.  PROCEDURE:  After informed consent was obtained, the patient was taken to the operating room where anesthesia was induced and found to be adequate.  The patient was placed in dorsal supine position with leftward tilt and prepped and draped in the usual sterile fashion.  A Pfannenstiel skin incision was made with a scalpel over the previous incision and carried down the underlying layer of fascia.  A very thick fascia was incised in the midline.  The incision extended laterally with the Mayo scissors.  The inferior aspect of fascial incision was grasped with Kocher clamps and tented up, and the underlying rectus muscle was dissected off.  Attention was then turned to the superior aspect of fascial incision which in a similar fashion was grasped, tented up with the Kocher clamps and the underlying rectus muscle was dissected off.  The rectus muscles were carefully separated in the midline and the  intraabdominal cavity was reentered.  No perineum was identified.  A small amount of adhesions were visualized between the omentum and the anterior abdominal wall.  No adhesions were found to the uterus or lower uterine segment.  The vesicouterine perineum was identified, tented up, and entered sharply with Metzenbaum scissors. This incision was extended laterally and a bladder flap created digitally.  The Alexis self-retaining retractor was introduced into the abdominal cavity.  The lower uterine segment was incised in a transverse fashion with a scalpel.  This incision was extended laterally with the bandage scissors.  The infant was delivered atraumatically.  The nose and mouth were bulb suctioned.  The cord was clamped and cut.  The infant was handed to the awaiting pediatrician.  The placenta was delivered via gentle uterine massage.  The uterus was cleared of all clots and debris.  The uterine incision was repaired with 0 Vicryl in a running locked fashion.  Excellent hemostasis was noted.  Copious irrigation of the abdomen was then performed.  The gutters were cleared of all clots and debris.  Again, hemostasis was maintained.  The Alexis retractor was removed from the patient's abdomen.  The muscles were reapproximated with 0 Vicryl.  The fascia was reapproximated with 0 Vicryl and the skin was closed with staples.  The patient tolerated the procedure well.  Sponge, lap, and needle counts were correct x2.     Catalina Antigua, MD     PC/MEDQ  D:  02/28/2011  T:  03/01/2011  Job:  [id number]  Electronically Signed by Catalina Antigua  on 03/06/2011 08:11:01 AM

## 2011-03-21 DIAGNOSIS — F1210 Cannabis abuse, uncomplicated: Secondary | ICD-10-CM | POA: Insufficient documentation

## 2011-03-21 DIAGNOSIS — F17200 Nicotine dependence, unspecified, uncomplicated: Secondary | ICD-10-CM

## 2011-04-04 ENCOUNTER — Ambulatory Visit (INDEPENDENT_AMBULATORY_CARE_PROVIDER_SITE_OTHER): Payer: Medicaid Other | Admitting: Obstetrics and Gynecology

## 2011-04-04 ENCOUNTER — Encounter: Payer: Self-pay | Admitting: Obstetrics and Gynecology

## 2011-04-04 DIAGNOSIS — B9689 Other specified bacterial agents as the cause of diseases classified elsewhere: Secondary | ICD-10-CM

## 2011-04-04 DIAGNOSIS — Z113 Encounter for screening for infections with a predominantly sexual mode of transmission: Secondary | ICD-10-CM

## 2011-04-04 LAB — POCT PREGNANCY, URINE: Preg Test, Ur: NEGATIVE

## 2011-04-04 MED ORDER — METRONIDAZOLE 0.75 % VA GEL: 1.0000 | Freq: Two times a day (BID) | VAGINAL | Status: AC

## 2011-04-04 MED ORDER — NORETHINDRONE-ETH ESTRADIOL 1-35 MG-MCG PO TABS: 1.0000 | ORAL_TABLET | Freq: Every day | ORAL | Status: DC

## 2011-04-04 MED ORDER — NORETHINDRONE 0.35 MG PO TABS: 1.0000 | ORAL_TABLET | Freq: Every day | ORAL | Status: DC

## 2011-04-04 NOTE — Progress Notes (Signed)
  Subjective:     Tina [name] is a 30 y.o. female who presents for a postpartum visit. She is 4 weeks postpartum following a low  transverse Cesarean section. I have fully reviewed the prenatal and intrapartum course. The delivery was at 39 gestational weeks. Outcome: repeat cesarean section, low transverse incision. Anesthesia: spinal. Postpartum course has been uncomplicated. She has resumed intercourse without problems. She is bottle feeding. Denies depressive sx. Cares for 4 young children. Not exercising.  Smokes 5 cigarettes /day and would like OCPs for contraception. Had [facility] nurse visit and incision check and states there was some wound separation along rt edge which has resolved. Denies pain. No menses.  Baby's course has been uncomplicated and she has been to pediatrician visits.  Patient is sexually active.   The following portions of the patient's history were reviewed and updated as appropriate: allergies, current medications, past family history, past medical history, past social history, past surgical history and problem list. Note: After visit and vaginal exam done and pt on her way out, she adds that she has a malodorous vaginal discharge similar to that she has had in past with BV. Review of Systems Pertinent items are noted in HPI.   Objective:    BP 118/76  Pulse 78  Temp(Src) 97.3 F (36.3 C) (Oral)  Ht 5\' 5"  (1.651 m)  Breastfeeding? No  General:  alert, cooperative and appears stated age           Abdomen: soft, non-tender; bowel sounds normal; no masses,  no organomegaly 3 cm diastasis Pfannensteil incision healed   Vulva:  normal  Vagina: normal vagina, no discharge, exudate, lesion, or erythema     Corpus: normal size, contour, position, consistency, mobility, non-tender  Adnexa:  no mass, fullness, tenderness   Assessment:   postpartum exam. Pap smear not done at today's visit.  4 wk s/p 4th LTCS. Incision well healed. Smoker.Presumptive BV  Plan:    1.  Contraception: OCP (estrogen/progesterone) 2. Counseled on smoking cessation 3. Follow up in: 6 months or as needed.  4.Metrogel

## 2011-04-04 NOTE — Progress Notes (Signed)
Pt request pregnancy test, urine pregnancy test done=negative, requests std testing

## 2011-04-04 NOTE — Patient Instructions (Signed)
Oral Contraceptives (Birth Control Pills) Oral contraceptives (OCs) are medicines taken to prevent pregnancy. They are the most widely used method of birth control. OCs work by preventing the ovaries from releasing eggs. The OC hormones also cause the mucus on the cervix to thicken, preventing the sperm from entering the uterus. They also cause the lining of the uterus to become thin, not allowing a fertilized egg to attach to the inside of the uterus. OCs have a failure rate of less than 1%, when taken exactly as prescribed. THERE ARE 2 TYPES OF OC  OC that contains a mix of estrogen and progesterone hormones is the most common OC used. It is taken for 21 days, followed by 7 days of not taking the OC hormones. It can be packaged as 28 pills, with the last 7 pills being inactive. You take a pill every day. This way you do not need to remember when to restart taking the active pills. Most women will begin their menstrual period 2 to 3 days after taking the hormone pill. The menstrual period is usually lighter and shorter. This combination OC should not be taken if you are breast-feeding.   The progesterone only (minipill) OC does not contain estrogen. It is taken every day, continuously. You may have only spotting for a period, or no period at all. The progesterone only OC can be taken if you are breast-feeding your baby.  OCs come in:  Packs of 21 pills, with no pills to take for 7 days after the last pill.   Packs of 28 pills, with a pill to take every day. The last 7 pills are without hormones.   Packs of 91 pills (continuous or extended use), with a pill to take every day. The first 84 pills contain the hormones, and the last 7 pills do not. That is when you will have your menstrual period. You will not have a menstrual period during the time you are taking the first 84 pills.  HOW TO TAKE OC Your caregiver may advise you on how to start taking the first cycle of OCs. Otherwise, you can:  Start  on day 1 or day 5 of your menstrual period, taking the first pack of the OC. You will not need any backup contraceptive protection with this start time.   Start on the first Sunday after your menstrual period, day 7 of your menstrual period, or the day you get your prescription. In these cases, you will need backup contraceptive protection for the first cycle.  No matter which day you start the OC, you will always start a new pack on that same day of the week. It is a good idea to have an extra pack of OCs and a backup contraceptive method available, in case you miss some pills or lose your OC pack. COMMON REASONS FOR FAILURE   Forgetting to take the pill at the same time every day.   Poor absorption of the pill from the stomach into the bloodstream. This can be caused by diarrhea, vomiting, and the use of some medicines that kill germs (antibiotics).   Stomach or intestinal disease.   Taking OCs with other medicines that may make them less effective (carbamazepine, phenytoin, phenobarbital, rifampin).   Using OCs that have passed their expiration dates.   Forgetting to restart the pills on day 7, when using the packs of 21 pills.  If you forget to take 1 pill, take it as soon as you remember, and take the next   pill at the regular time. If you miss 2 or more pills, use backup birth control until your next menstrual period starts. Also, you may have vaginal spotting or bleeding when you miss 2 or more OC pills. If you use the pack of 28 pills or 91 pills, and you miss 1 of the last 7 pills (pills with no hormones), it will not matter. Just throw away the rest of the non-hormone pills and start a new 28 or 91 pill pack. COMMON USES OF OC  Decreasing premenstrual problems (symptoms).  Treating menstrual period cramps.   Avoiding becoming pregnant.   Regulating the menstrual cycle.   Treating acne.   Decreasing the heavy menstrual flow.   Treating dysfunctional (abnormal) uterine bleeding.    Treating chronic pelvic pain.   Treating polycystic ovary syndrome (ovary does not ovulate and produces tiny cysts).   Treating endometriosis (uterus lining growing in the pelvis, tubes, and ovaries).   Can be used for emergency contraception.   OCs DO NOT prevent sexually transmitted diseases (STDs). Safer sex practices, such as using condoms along with the pill, can help prevent STDs.  BENEFITS  OC reduces the risk of:   Cancer of the ovary and uterus.   Ovarian cysts.   Pelvic infection.   Symptoms of polycystic ovary syndrome.   Loss of bone (osteoporosis).   Noncancerous (benign) breast disease (fibrocystic breast changes).   Lack of red blood cells (anemia) from heavy or long menstrual periods.   Pregnancy occurring outside the uterus (tubal pregnancy).   Acne.   Slows down the flow of heavy menstrual periods.   Sometimes helps control premenstrual syndrome (PMS).   Stops menstrual cramps and pain.   Controls irregular menstrual periods.   Can be used as emergency contraception.  YOU SHOULD NOT TAKE THE PILL IF YOU:  Are pregnant, or are trying to get pregnant.  Have unexplained or abnormal vaginal bleeding.   Have a history of liver disease, stroke, or heart attack.   Smoke.   Have a history of blood clots, cancer, or heart problems.   Have gallbladder disease.   Have breast cancer or suspect breast cancer.   Have or suspect pelvic cancer.   Have high blood pressure.  Have high cholesterol or high triglycerides.   Have mental depression.   Are breast-feeding, except for the progesterone only OC, with approval of your caregiver.   Have diabetes with kidney, eye, or other blood vessel complications. Or if you have diabetes for 20 years or more.   Have heart valve disease.   Have migraine headaches. They may get worse.   Before taking the pill, a woman will have a physical exam and Pap test. Your caregiver may order blood tests to check  blood sugar and cholesterol levels, and other blood tests that may be necessary. SIDE EFFECTS OF THE PILL MAY INCLUDE:  Breast tenderness, pain and discharge.   Change in sex drive (increased or decreased libido).   Depression.   Being tired often.   Headaches.   Anxiety.   Irregular spotting or vaginal bleeding for a couple of months.  Leg pain.   Cramps, or swelling of your limbs (extremities).   Mood swings.   Weight loss or weight gain.   Feeling sick to your stomach (nausea).   Change in appetite (hunger).  Loss of hair.   Yeast or fungus vaginal infection.   Nervousness.   Rash.   Acne.   No menstrual period (amenorrhea).     When starting an OC, it is usually best to allow 2-3 months, if possible, for the body to adjust (before stopping because of side effects). This allows for adjustment to the changes in hormone levels. If a woman continues to have side effects, it may be possible to change to a different OC. It is important to discuss side effects with your caregiver. Often, changing to a different pill causes the side effects to subside. RISKS AND COMPLICATIONS  Blood clots of the leg, heart, lung, or brain.  High blood pressure.   Gallbladder disease.  Liver tumors.   Brain bleeding (hemorrhage).  Slight risk of breast cancer.   HOME CARE INSTRUCTIONS  Do not smoke.   Only take over-the-counter or prescription medicines for pain, discomfort, fever, or breast tenderness as directed by your caregiver.   Always use a condom to protect against sexually transmitted disease. OCs do not protect against STDs.   Keep a calendar, marking your menstrual period days.  Recommendations, types, and dosages of OC use change continually. Discuss your choices with your caregiver, and decide what is best for you. There are always exceptions to guidelines. You should always read the information that comes with the OC, and check whether there are any new  recommendations or guidelines. SEEK MEDICAL CARE IF:  You develop nausea and vomiting from the OC.   You have abnormal vaginal discharge.   You need treatment for headaches.   You develop a rash.   You miss your menstrual period.   You develop abnormal vaginal bleeding.   You are losing your hair.   You need treatment for mood swings or depression.   You get dizzy when taking the OC.   You develop acne from taking the OC.  SEEK IMMEDIATE MEDICAL CARE IF:  You develop leg pain.   You develop chest pain.   You develop shortness of breath.   You develop abdominal pain.   You have an uncontrolled headache.   You develop numbness or slurred speech.   You develop visual problems (loss of vision, double or blurry vision).   You develop heavy vaginal bleeding.  If you are taking the pill, STOP RIGHT AWAY and CALL YOUR CAREGIVER IMMEDIATELY if the following occur:  You develop chest pain and shortness of breath.   You develop pain, redness, and swelling in the legs.   You develop severe headaches, visual changes, or belly (abdominal) pain.   You develop severe depression.   You become pregnant.  Document Released: 11/01/2002 Document Re-Released: 09/02/2009 [employer] Patient Information 2011 [employer]. 

## 2011-04-05 LAB — GC/CHLAMYDIA PROBE AMP, URINE: Chlamydia, Swab/Urine, PCR: NEGATIVE

## 2011-04-15 ENCOUNTER — Inpatient Hospital Stay (HOSPITAL_COMMUNITY)
Admission: AD | Admit: 2011-04-15 | Discharge: 2011-04-15 | Disposition: A | Discharge status: Home / Self Care | Payer: Medicaid Other | Source: Ambulatory Visit | Attending: Family Medicine | Admitting: Family Medicine

## 2011-04-15 ENCOUNTER — Encounter (HOSPITAL_COMMUNITY): Payer: Self-pay | Admitting: *Deleted

## 2011-04-15 DIAGNOSIS — N949 Unspecified condition associated with female genital organs and menstrual cycle: Secondary | ICD-10-CM | POA: Insufficient documentation

## 2011-04-15 DIAGNOSIS — N923 Ovulation bleeding: Secondary | ICD-10-CM

## 2011-04-15 DIAGNOSIS — R300 Dysuria: Secondary | ICD-10-CM | POA: Insufficient documentation

## 2011-04-15 DIAGNOSIS — N938 Other specified abnormal uterine and vaginal bleeding: Secondary | ICD-10-CM | POA: Insufficient documentation

## 2011-04-15 HISTORY — DX: Trichomoniasis, unspecified: A59.9

## 2011-04-15 HISTORY — DX: Gonococcal infection, unspecified: A54.9

## 2011-04-15 LAB — URINALYSIS, ROUTINE W REFLEX MICROSCOPIC
Bilirubin Urine: NEGATIVE
Glucose, UA: NEGATIVE mg/dL
Specific Gravity, Urine: >1.03 — ABNORMAL HIGH (ref 1.005–1.030)
pH: 5.5 (ref 5.0–8.0)

## 2011-04-15 LAB — URINE MICROSCOPIC-ADD ON

## 2011-04-15 LAB — WET PREP, GENITAL
Clue Cells Wet Prep HPF POC: NONE SEEN
Trich, Wet Prep: NONE SEEN
Yeast Wet Prep HPF POC: NONE SEEN

## 2011-04-15 NOTE — ED Provider Notes (Signed)
[name] is a 30 y.o. [id number] now 6.4 wks PP Chief Complaint  Patient presents with  . Dysuria  SUBJECTIVE:  Her concern is that she has had unprotected intercourse with a new partner and would like to be checked for STDs. She states that she had postpartum visit at our clinic on 8/10 and was treated with MetroGel for BV. She was unsure when to start her OCPs and is unsure what type they are however she is bottlefeeding and has been the whole time. She did start on 8/15 and has not missed any pills. Yesterday she began menstrual-like vaginal bleeding. She denies abnormal vaginal discharge. She denies dysuria, urinary frequency, urgency. She does feel some pelvic pressure after urination. No other PP concerns.   OBJECTIVE: Filed Vitals:   04/15/11 1013  BP: 130/94  Pulse: 75  Temp: 97.4 F (36.3 C)  Resp: 18   Gen: NAD Abd: soft, NT Pelvic: NEFG; Spec: mod amt menstrual-like blood, cx parous, no lesions, no discharge seen; Bimanual: , cx mobile, no CMT; uterus sl enlarged, NT; Adnexae NT, no masses  A/P D/W Dr. Shawnie Pons. Assume this is BTB on OCs and advise to continue taking as directed. Reviewed directions of what to do if she misses a pill. Reassured re BTB. Safe sex reviewed. Keep scheduled F/U appointment [facility].

## 2011-04-15 NOTE — Progress Notes (Signed)
C/S 07/06.  When went in for 6 wk check up was told had BV.  Wants to make sure it is gone and doesn't have any other STD's.  States has abd painpressure after urination. Denies frequency or urgency

## 2011-04-17 NOTE — ED Provider Notes (Signed)
Chart reviewed and agree with management and plan.  

## 2011-05-07 ENCOUNTER — Inpatient Hospital Stay (EMERGENCY_DEPARTMENT_HOSPITAL)
Admission: AD | Admit: 2011-05-07 | Discharge: 2011-05-07 | Disposition: A | Discharge status: Home / Self Care | Payer: Medicaid Other | Source: Ambulatory Visit | Attending: Obstetrics and Gynecology | Admitting: Obstetrics and Gynecology

## 2011-05-07 ENCOUNTER — Encounter (HOSPITAL_COMMUNITY): Payer: Self-pay | Admitting: *Deleted

## 2011-05-07 ENCOUNTER — Emergency Department (HOSPITAL_COMMUNITY)
Admission: EM | Admit: 2011-05-07 | Discharge: 2011-05-07 | Discharge status: Against Medical Advice | Payer: Medicaid Other | Attending: Emergency Medicine | Admitting: Emergency Medicine

## 2011-05-07 DIAGNOSIS — N949 Unspecified condition associated with female genital organs and menstrual cycle: Secondary | ICD-10-CM | POA: Insufficient documentation

## 2011-05-07 DIAGNOSIS — B9689 Other specified bacterial agents as the cause of diseases classified elsewhere: Secondary | ICD-10-CM

## 2011-05-07 DIAGNOSIS — A499 Bacterial infection, unspecified: Secondary | ICD-10-CM

## 2011-05-07 DIAGNOSIS — Z0389 Encounter for observation for other suspected diseases and conditions ruled out: Secondary | ICD-10-CM | POA: Insufficient documentation

## 2011-05-07 DIAGNOSIS — N760 Acute vaginitis: Secondary | ICD-10-CM

## 2011-05-07 LAB — URINALYSIS, ROUTINE W REFLEX MICROSCOPIC
Glucose, UA: NEGATIVE mg/dL
Hgb urine dipstick: NEGATIVE
Leukocytes, UA: NEGATIVE
Protein, ur: NEGATIVE mg/dL
Specific Gravity, Urine: >1.03 — ABNORMAL HIGH (ref 1.005–1.030)
Urobilinogen, UA: 0.2 mg/dL (ref 0.0–1.0)

## 2011-05-07 LAB — WET PREP, GENITAL
Trich, Wet Prep: NONE SEEN
Yeast Wet Prep HPF POC: NONE SEEN

## 2011-05-07 MED ORDER — METRONIDAZOLE 500 MG PO TABS: 500.0000 mg | ORAL_TABLET | Freq: Two times a day (BID) | ORAL | Status: AC

## 2011-05-07 NOTE — Progress Notes (Signed)
Pt states she has a white vaginal discharge with a fishy odor.

## 2011-05-07 NOTE — ED Provider Notes (Signed)
History     Chief Complaint  Patient presents with  . Vaginal Discharge   HPI  Ms. Dolph present with history of 1 week of vaginal discharge.  She was seen on 8/10 for postpartum visit and was diagnosed with BV and was prescribed metronidazole gel for treatment.  States that discharge has not improved since time of initial diagnosis.  Currently sexually active and reports using no form of contraception or barrier protection. Denies any burning, pruritis, bleeding or pain associated with discharge.  Denies foul odor to discharge.  Denies dysuria, urinary frequency or urgency.  Denies any abdominal pain since onset of discharge.   OB History    Grav Para Term Preterm Abortions TAB SAB Ect Mult Living   4 4 4  0 0 0 0 0 0 4      Past Medical History  Diagnosis Date  . 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 Gonorrhea   . Trichimoniasis   . Diabetes mellitus in pregnancy     Past Surgical History  Procedure Date  . Cesarean section     x4    Family History  Problem Relation Age of Onset  . Hypertension Paternal Grandfather     History  Substance Use Topics  . Smoking status: Current Everyday Smoker -- 0.5 packs/day  . Smokeless tobacco: Never Used  . Alcohol Use: No    Allergies: No Known Allergies  Prescriptions prior to admission  Medication Sig Dispense Refill  . norethindrone-ethinyl estradiol 1/35 (ORTHO-NOVUM, NORTREL,CYCLAFEM) tablet Take 1 tablet by mouth daily.  1 Package  11  . prenatal vitamin w/FE, FA (PRENATAL 1 + 1) 27-1 MG TABS Take 1 tablet by mouth daily.          Review of Systems  Constitutional: Negative.   HENT: Negative.   Eyes: Negative.   Respiratory: Negative.   Cardiovascular: Negative.   Gastrointestinal: Negative for nausea, vomiting, abdominal pain, diarrhea and constipation.  Genitourinary: Negative for dysuria, urgency, frequency, hematuria and flank pain.  Musculoskeletal: Negative.   Skin: Negative.     Endo/Heme/Allergies: Negative.   Psychiatric/Behavioral: Negative.    Physical Exam   Blood pressure 130/88, pulse 75, temperature 98.3 F (36.8 C), temperature source Oral, resp. rate 16, height 5\' 3"  (1.6 m), weight 144 lb 6.4 oz (65.499 kg), last menstrual period 04/08/2011, SpO2 99.00%.  Results for orders placed during the hospital encounter of 05/07/11 (from the past 48 hour(s))  URINALYSIS, ROUTINE W REFLEX MICROSCOPIC     Status: Abnormal   Collection Time   05/07/11 10:40 AM      Component Value Range Comment   Color, Urine YELLOW  YELLOW     Appearance CLEAR  CLEAR     Specific Gravity, Urine >1.030 (*) 1.005 - 1.030     pH 6.0  5.0 - 8.0     Glucose, UA NEGATIVE  NEGATIVE (mg/dL)    Hgb urine dipstick NEGATIVE  NEGATIVE     Bilirubin Urine NEGATIVE  NEGATIVE     Ketones, ur NEGATIVE  NEGATIVE (mg/dL)    Protein, ur NEGATIVE  NEGATIVE (mg/dL)    Urobilinogen, UA 0.2  0.0 - 1.0 (mg/dL)    Nitrite NEGATIVE  NEGATIVE     Leukocytes, UA NEGATIVE  NEGATIVE  MICROSCOPIC NOT DONE ON URINES WITH NEGATIVE PROTEIN, BLOOD, LEUKOCYTES, NITRITE, OR GLUCOSE <1000 mg/dL.  POCT PREGNANCY, URINE     Status: Normal   Collection Time  05/07/11 10:45 AM      Component Value Range Comment   Preg Test, Ur NEGATIVE     WET PREP, GENITAL     Status: Abnormal   Collection Time   05/07/11 11:03 AM      Component Value Range Comment   Yeast, Wet Prep NONE SEEN  NONE SEEN     Trich, Wet Prep NONE SEEN  NONE SEEN     Clue Cells, Wet Prep FEW (*) NONE SEEN     WBC, Wet Prep HPF POC FEW (*) NONE SEEN  FEW BACTERIA SEEN      Physical Exam  Constitutional: She is oriented to person, place, and time. She appears well-developed and well-nourished. No distress.  HENT:  Head: Normocephalic and atraumatic.  Eyes: Conjunctivae and EOM are normal. No scleral icterus.  Neck: Normal range of motion.  Cardiovascular: Normal rate, regular rhythm and intact distal pulses.  Exam reveals no gallop and no  friction rub.   No murmur heard. Respiratory: Effort normal and breath sounds normal. No respiratory distress. She has no wheezes.  GI: Soft. She exhibits no distension. There is no tenderness. There is no rebound and no guarding.  Genitourinary: Vaginal discharge (Moderate amount of white discharge present throughout vagina with some discharge from cervical os. Vagina without any visiable bleeding . No cerivical bleeding.  Cervix is without lesions or bleeding. ) found.       Moderate amount of white discharge present throughout vagina with some discharge from cervical os. Vagina without any visiable bleeding . No cerivical bleeding.  Cervix is without lesions or bleeding.  No palable uterine or adenexal masses on bimanual exam.  No cervical motion tenderness. No blood or discharge present on exam glove.  Neurological: She is alert and oriented to person, place, and time. She has normal reflexes.  Skin: Skin is warm and dry.  Psychiatric: She has a normal mood and affect. Her behavior is normal. Judgment and thought content normal.    MAU Course  Procedures  MDM   Assessment and Plan  Ms. Spurr is a 30 yo female who presents to MAU with complaint of persistent vaginal discharge since diagnosis of BV on 04/04/2011.   1)BV: Wet prep results consistent with persistent bacterial vaginosis infection. Metronidazole 500 mg BID x7 days.  Advised patient on important of alcohol avoidance during course of antibiotic therapy.    Adam Chenevert PA-S 05/07/2011, 11:10 AM   I agree with the above plan and supervised the student. Clinton Gallant. Rice III, DrHSc, MPAS, PA-C   Henrietta Hoover, PA 05/07/11 1316

## 2011-05-08 LAB — GC/CHLAMYDIA PROBE AMP, GENITAL
Chlamydia, DNA Probe: POSITIVE — AB
GC Probe Amp, Genital: NEGATIVE

## 2011-05-14 NOTE — ED Provider Notes (Signed)
Agree with above note.  Tina [name] 05/14/2011 10:23 AM

## 2011-05-20 LAB — RAPID STREP SCREEN (MED CTR MEBANE ONLY): Streptococcus, Group A Screen (Direct): POSITIVE — AB

## 2011-05-20 LAB — GONOCOCCUS CULTURE

## 2011-05-22 LAB — URINALYSIS, ROUTINE W REFLEX MICROSCOPIC
Bilirubin Urine: NEGATIVE
Ketones, ur: NEGATIVE
Specific Gravity, Urine: 1.008
pH: 5.5

## 2011-05-22 LAB — RPR: RPR Ser Ql: NONREACTIVE

## 2011-05-22 LAB — URINE MICROSCOPIC-ADD ON

## 2011-05-22 LAB — POCT PREGNANCY, URINE
Operator id: 294501
Preg Test, Ur: NEGATIVE

## 2011-05-22 LAB — WET PREP, GENITAL: Yeast Wet Prep HPF POC: NONE SEEN

## 2011-06-06 LAB — CBC
HCT: 28.2 — ABNORMAL LOW
HCT: 36.3
Hemoglobin: 10 — ABNORMAL LOW
Hemoglobin: 12.7
Platelets: 132 — ABNORMAL LOW
Platelets: 159
RBC: 3.2 — ABNORMAL LOW
RBC: 4.14
WBC: 9.5
WBC: 9.5

## 2011-06-06 LAB — HEMOGLOBIN A1C: Mean Plasma Glucose: 133

## 2011-11-24 ENCOUNTER — Encounter (HOSPITAL_COMMUNITY): Payer: Self-pay | Admitting: *Deleted

## 2011-11-24 ENCOUNTER — Inpatient Hospital Stay (HOSPITAL_COMMUNITY): Payer: Medicaid Other

## 2011-11-24 ENCOUNTER — Inpatient Hospital Stay (HOSPITAL_COMMUNITY)
Admission: AD | Admit: 2011-11-24 | Discharge: 2011-11-24 | Disposition: A | Discharge status: Home / Self Care | Payer: Medicaid Other | Source: Ambulatory Visit | Attending: Obstetrics & Gynecology | Admitting: Obstetrics & Gynecology

## 2011-11-24 DIAGNOSIS — R109 Unspecified abdominal pain: Secondary | ICD-10-CM | POA: Insufficient documentation

## 2011-11-24 DIAGNOSIS — W108XXA Fall (on) (from) other stairs and steps, initial encounter: Secondary | ICD-10-CM | POA: Insufficient documentation

## 2011-11-24 DIAGNOSIS — Z1389 Encounter for screening for other disorder: Secondary | ICD-10-CM

## 2011-11-24 DIAGNOSIS — O99891 Other specified diseases and conditions complicating pregnancy: Secondary | ICD-10-CM | POA: Insufficient documentation

## 2011-11-24 DIAGNOSIS — Z3490 Encounter for supervision of normal pregnancy, unspecified, unspecified trimester: Secondary | ICD-10-CM

## 2011-11-24 HISTORY — DX: Gestational diabetes mellitus in pregnancy, unspecified control: O24.419

## 2011-11-24 LAB — URINALYSIS, ROUTINE W REFLEX MICROSCOPIC
Bilirubin Urine: NEGATIVE
Glucose, UA: NEGATIVE mg/dL
Leukocytes, UA: NEGATIVE
Nitrite: NEGATIVE
Specific Gravity, Urine: 1.02 (ref 1.005–1.030)
pH: 7 (ref 5.0–8.0)

## 2011-11-24 LAB — CBC
Platelets: 168 10*3/uL (ref 150–400)
RBC: 3.9 MIL/uL (ref 3.87–5.11)
RDW: 13.2 % (ref 11.5–15.5)
WBC: 6.3 10*3/uL (ref 4.0–10.5)

## 2011-11-24 LAB — ABO/RH: ABO/RH(D): A NEG

## 2011-11-24 MED ORDER — PRENATAL VITAMINS (DIS) PO TABS: 1.0000 | ORAL_TABLET | Freq: Every day | ORAL | Status: DC

## 2011-11-24 NOTE — ED Provider Notes (Signed)
Attestation of Attending Supervision of Advanced Practitioner: Evaluation and management procedures were performed by the PA/NP/CNM/OB Fellow under my supervision/collaboration. Chart reviewed, and agree with management and plan.  Jaynie Collins, M.D. 11/24/2011 3:07 PM

## 2011-11-24 NOTE — Discharge Instructions (Signed)
Vaginal Bleeding During Pregnancy, Second Trimester  A small amount of bleeding (spotting) is relatively common in pregnancy. It usually stops on its own. There are many causes for bleeding or spotting in pregnancy. Some bleeding may be related to the pregnancy and some may not. Cramping with the bleeding is more serious and concerning. Tell your caregiver if you have any vaginal bleeding.   CAUSES    Infection, inflammation or growths on the cervix.   The placenta may partially or completely be covering the opening of the cervix inside the uterus.   The placenta may have separated from the uterus.   You may be having early/preterm labor.   The cervix is not strong enough to keep a baby inside the uterus (cervical insufficiency).   Many tiny cysts in the uterus instead of pregnancy tissue (molar pregnancy)  SYMPTOMS    Vaginal spotting or bleeding with or without cramps.   Uterine contractions.   Abnormal vaginal discharge.   You may have spotting or spotting after having sexual intercourse.  DIAGNOSIS   To evaluate the pregnancy, your caregiver may:   Do a pelvic exam.   Take blood tests.   Do an ultrasound.  It is very important to follow your caregiver's instructions.   TREATMENT    Evaluation of the pregnancy with blood tests and ultrasound.   Bed rest (getting up to use the bathroom only).   Rho-gam immunization if the mother is Rh negative and the father is Rh positive.   If you are having uterine contractions, you may be given medication to stop the contractions.   If you have cervical insufficiency, you may have a suture placed in the cervix to close it.  HOME CARE INSTRUCTIONS    If your caregiver orders bed rest, you may need to make arrangements for the care of other children and for any other responsibilities. However, your caregiver may allow you to continue light activity.   Keep track of the number of pads you use each day and how soaked (saturated) they are. Write this down.   Do  not use tampons. Do not douche.   Do not have sexual intercourse or orgasms until approved by your physician.   Save any tissue that you pass for your caregiver to see.   Take medicine for cramps only with your caregiver's permission.   Do not take aspirin because it can make you bleed.   Do not exercise, do any strenuous activities or heavy lifting without your caregiver's permission.  SEEK IMMEDIATE MEDICAL CARE IF:    You experience severe cramps in your stomach, back or belly (abdomen).   You have uterine contractions.   You have an oral temperature above 102 F (38.9 C), not controlled by medicine.   You develop chills.   You pass large clots or tissue.   Your bleeding increases or you become light-headed, weak or have fainting episodes.   You have leaking or a gush of fluid from your vagina.  Document Released: 05/21/2005 Document Revised: 07/31/2011 Document Reviewed: 11/30/2008  [employer] Patient Information 2012 [employer].

## 2011-11-24 NOTE — MAU Note (Signed)
+   preg test last week.  Going up steps carrying groceries; missed step and fell. Landed on knees.  Started feeling cramping after.  LMP Nov or Dec.  States hx of high risk, blood antibody issue.

## 2011-11-24 NOTE — ED Provider Notes (Signed)
  History     CSN: [id number]  Arrival date and time: 11/24/11 1143   None     Chief Complaint  Patient presents with  . Fall   Fall Associated symptoms include abdominal pain. Pertinent negatives include no vomiting.  31 yo [id number] at unknown gestational age.  States her LMP was sometime in November or December, but her periods are irregular.  Had positive pregnancy test last week.  Has not started [facility] yet.  Yesterday was carrying groceries into her apartment and fell going down stairs.  She landed on her knees with the fall, abdomen did not strike ground.   Since that time she has had some "cramping" in her lower abdomen.  Pain is moderate in severity.  She denies any associated vaginal bleeding, vaginal discharge or irritation with this.   She thinks she may have felt some fetal movement already during this pregnancy but she is unsure.    OB History    Grav Para Term Preterm Abortions TAB SAB Ect Mult Living   5 4 4  0 0 0 0 0 0 4      Past Medical History  Diagnosis Date  . 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 Gonorrhea   . Trichimoniasis   . Diabetes mellitus in pregnancy   . Gestational diabetes     Past Surgical History  Procedure Date  . Cesarean section     x4    Family History  Problem Relation Age of Onset  . Hypertension Paternal Grandfather   . Anesthesia problems Neg Hx     History  Substance Use Topics  . Smoking status: Current Everyday Smoker -- 0.5 packs/day    Types: Cigarettes  . Smokeless tobacco: Never Used  . Alcohol Use: No    Allergies: No Known Allergies  Prescriptions prior to admission  Medication Sig Dispense Refill  . norethindrone-ethinyl estradiol 1/35 (ORTHO-NOVUM, NORTREL,CYCLAFEM) tablet Take 1 tablet by mouth daily.  1 Package  11    Review of Systems  Gastrointestinal: Positive for abdominal pain. Negative for vomiting, diarrhea and blood in stool.  Genitourinary: Negative for dysuria,  urgency and frequency.   Physical Exam   Blood pressure 113/60, pulse 95, temperature 98 F (36.7 C), temperature source Oral, resp. rate 20, height 5' 4.5" (1.638 m), weight 65.772 kg (145 lb), last menstrual period 08/08/2011.  Physical Exam  Constitutional: She appears well-nourished. No distress.  GI: Soft. Bowel sounds are normal. She exhibits no distension. There is no tenderness. There is no rebound.  Genitourinary:       Bimanual exam done, uterus palpated, mildly enlarged, non tender.  Adnexa without fullness or pain.  No CMT.    MAU Course  Procedures   Recent fall, history of Rh negative.  -ABO/RH  -CBC Unsure of dated  -[place] for eval of fetus and dating   Imaging:  Assessment and Plan  A: Normal IUP [redacted]w[redacted]d by LMP, confirmed by U/S on 11/24/11 Fall in early pregnancy  P: D/C home  List of prenatal providers given Pregnancy verification letter given Return to MAU as needed   LEFTWICH-KIRBY, Euva Rundell 11/24/2011, 1:53 PM

## 2011-11-24 NOTE — MAU Note (Signed)
Pt in c/o falling down stairs last night onto knees.  Reports mild cramping in lower abdomen.  Believes she has felt movement.  Denies any bleeding or discharge.  LMP Nov or Dec, but hx of irregular periods.

## 2012-01-22 ENCOUNTER — Inpatient Hospital Stay (HOSPITAL_COMMUNITY): Payer: Medicaid Other

## 2012-01-22 ENCOUNTER — Encounter (HOSPITAL_COMMUNITY): Payer: Self-pay

## 2012-01-22 ENCOUNTER — Inpatient Hospital Stay (HOSPITAL_COMMUNITY)
Admission: AD | Admit: 2012-01-22 | Discharge: 2012-01-22 | Disposition: A | Discharge status: Home / Self Care | Payer: Medicaid Other | Source: Ambulatory Visit | Attending: Obstetrics & Gynecology | Admitting: Obstetrics & Gynecology

## 2012-01-22 DIAGNOSIS — N760 Acute vaginitis: Secondary | ICD-10-CM | POA: Insufficient documentation

## 2012-01-22 DIAGNOSIS — O2390 Unspecified genitourinary tract infection in pregnancy, unspecified trimester: Secondary | ICD-10-CM | POA: Insufficient documentation

## 2012-01-22 DIAGNOSIS — R1084 Generalized abdominal pain: Secondary | ICD-10-CM

## 2012-01-22 DIAGNOSIS — O0930 Supervision of pregnancy with insufficient antenatal care, unspecified trimester: Secondary | ICD-10-CM

## 2012-01-22 DIAGNOSIS — B9689 Other specified bacterial agents as the cause of diseases classified elsewhere: Secondary | ICD-10-CM | POA: Insufficient documentation

## 2012-01-22 DIAGNOSIS — N949 Unspecified condition associated with female genital organs and menstrual cycle: Secondary | ICD-10-CM | POA: Insufficient documentation

## 2012-01-22 DIAGNOSIS — A499 Bacterial infection, unspecified: Secondary | ICD-10-CM | POA: Insufficient documentation

## 2012-01-22 DIAGNOSIS — O479 False labor, unspecified: Secondary | ICD-10-CM

## 2012-01-22 LAB — WET PREP, GENITAL: Yeast Wet Prep HPF POC: NONE SEEN

## 2012-01-22 MED ORDER — METRONIDAZOLE 500 MG PO TABS: 500.0000 mg | ORAL_TABLET | Freq: Two times a day (BID) | ORAL | Status: AC

## 2012-01-22 MED ORDER — PRENATAL VITAMINS (DIS) PO TABS: 1.0000 | ORAL_TABLET | Freq: Every day | ORAL | Status: DC

## 2012-01-22 NOTE — MAU Note (Signed)
Patient is not in the lobby when called to triage.  

## 2012-01-22 NOTE — Discharge Instructions (Signed)
Please make a new OB with your new OB provider as soon as possible.  Please call and follow up on the status of your medicaid.  I have sent in a prescription for Flagyl to treat your Bacterial Vaginosis as well as a Pre-natal vitamin Consider stopping smoking for your health and for ALL of your children's health.  Bacterial Vaginosis Bacterial vaginosis (BV) is a vaginal infection where the normal balance of bacteria in the vagina is disrupted. The normal balance is then replaced by an overgrowth of certain bacteria. There are several different kinds of bacteria that can cause BV. BV is the most common vaginal infection in women of childbearing age. CAUSES   The cause of BV is not fully understood. BV develops when there is an increase or imbalance of harmful bacteria.   Some activities or behaviors can upset the normal balance of bacteria in the vagina and put women at increased risk including:   Having a new sex partner or multiple sex partners.   Douching.   Using an intrauterine device (IUD) for contraception.   It is not clear what role sexual activity plays in the development of BV. However, women that have never had sexual intercourse are rarely infected with BV.  Women do not get BV from toilet seats, bedding, swimming pools or from touching objects around them.  SYMPTOMS   Grey vaginal discharge.   A fish-like odor with discharge, especially after sexual intercourse.   Itching or burning of the vagina and vulva.   Burning or pain with urination.   Some women have no signs or symptoms at all.  DIAGNOSIS  Your caregiver must examine the vagina for signs of BV. Your caregiver will perform lab tests and look at the sample of vaginal fluid through a microscope. They will look for bacteria and abnormal cells (clue cells), a pH test higher than 4.5, and a positive amine test all associated with BV.  RISKS AND COMPLICATIONS   Pelvic inflammatory disease (PID).   Infections  following gynecology surgery.   Developing HIV.   Developing herpes virus.  TREATMENT  Sometimes BV will clear up without treatment. However, all women with symptoms of BV should be treated to avoid complications, especially if gynecology surgery is planned. Female partners generally do not need to be treated. However, BV may spread between female sex partners so treatment is helpful in preventing a recurrence of BV.   BV may be treated with antibiotics. The antibiotics come in either pill or vaginal cream forms. Either can be used with nonpregnant or pregnant women, but the recommended dosages differ. These antibiotics are not harmful to the baby.   BV can recur after treatment. If this happens, a second round of antibiotics will often be prescribed.   Treatment is important for pregnant women. If not treated, BV can cause a premature delivery, especially for a pregnant woman who had a premature birth in the past. All pregnant women who have symptoms of BV should be checked and treated.   For chronic reoccurrence of BV, treatment with a type of prescribed gel vaginally twice a week is helpful.  HOME CARE INSTRUCTIONS   Finish all medication as directed by your caregiver.   Do not have sex until treatment is completed.   Tell your sexual partner that you have a vaginal infection. They should see their caregiver and be treated if they have problems, such as a mild rash or itching.   Practice safe sex. Use condoms. Only have  1 sex partner.  PREVENTION  Basic prevention steps can help reduce the risk of upsetting the natural balance of bacteria in the vagina and developing BV:  Do not have sexual intercourse (be abstinent).   Do not douche.   Use all of the medicine prescribed for treatment of BV, even if the signs and symptoms go away.   Tell your sex partner if you have BV. That way, they can be treated, if needed, to prevent reoccurrence.  SEEK MEDICAL CARE IF:   Your symptoms are  not improving after 3 days of treatment.   You have increased discharge, pain, or fever.  MAKE SURE YOU:   Understand these instructions.   Will watch your condition.   Will get help right away if you are not doing well or get worse.  FOR MORE INFORMATION  Division of STD Prevention (DSTDP), Centers for Disease Control and Prevention: [employer] American Social Health Association (ASHA): www.ashastd.org  Document Released: 08/11/2005 Document Revised: 07/31/2011 Document Reviewed: 02/01/2009 [facility] Patient Information 2012 [place], [place].

## 2012-01-22 NOTE — MAU Provider Note (Signed)
History    CSN: [id number] Arrival date and time: 01/22/12 1126  First Provider Initiated Contact with Patient 01/22/12 1224   Chief Complaint  Patient presents with  . Vaginal Discharge  . Abdominal Pain   HPI Comments: Pt has not established care.  States her medicaid hasn't taken effect but has an appointment on 6/12. Pt seen for worsening vaginal discharge.  Hx of + Trichomonas, GC/Chlaymida.   Reports had large gush of clear fluid 2 weeks ago, and has been having slow leaking of clear fluid since that time.  Also non-odorus thick and creamy discharge.   No bleeding. Is [id number] with 4 prior C-Sections  Vaginal Discharge The patient's primary symptoms include a vaginal discharge. The patient's pertinent negatives include no genital itching, genital lesions, genital odor, genital rash, pelvic pain or vaginal bleeding. This is a new problem. The current episode started 1 to 4 weeks ago. The problem occurs constantly. The patient is experiencing no pain. She is pregnant. Associated symptoms include abdominal pain. Pertinent negatives include no anorexia, back pain, chills, constipation, diarrhea, discolored urine, dysuria, fever, flank pain, frequency, headaches, hematuria, joint pain, joint swelling, nausea, painful intercourse, rash, sore throat, urgency or vomiting. Associated symptoms comments: Mild lower abdominal pain, non-specific in nature, no RUQ pain. There has been no bleeding. No, her partner does not have an STD.  Abdominal Pain Pertinent negatives include no anorexia, constipation, diarrhea, dysuria, fever, frequency, headaches, hematuria, nausea or vomiting.  Patient is a 31 y.o. female presenting with vaginal discharge and abdominal pain.  Vaginal Discharge This is a new problem. The current episode started 1 to 4 weeks ago. The problem occurs constantly. Associated symptoms include abdominal pain. Pertinent negatives include no anorexia, chills, fever, headaches, nausea, rash,  sore throat or vomiting. Associated symptoms comments: Mild lower abdominal pain, non-specific in nature, no RUQ pain.  Abdominal Pain The primary symptoms of the illness include abdominal pain and vaginal discharge. The primary symptoms of the illness do not include fever, nausea, vomiting, diarrhea or dysuria.  The vaginal discharge is not associated with dysuria.  Symptoms associated with the illness do not include chills, anorexia, constipation, urgency, hematuria, frequency or back pain.  Single sexual partner - no current STD sx  OB History    Grav Para Term Preterm Abortions TAB SAB Ect Mult Living   5 4 4  0 0 0 0 0 0 4      Past Medical History  Diagnosis Date  . 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 Gonorrhea   . Trichimoniasis   . Diabetes mellitus in pregnancy   . Gestational diabetes     Past Surgical History  Procedure Date  . Cesarean section     x4    Family History  Problem Relation Age of Onset  . Hypertension Paternal Grandfather   . Anesthesia problems Neg Hx   . Hypotension Neg Hx   . Malignant hyperthermia Neg Hx   . Pseudochol deficiency Neg Hx     History  Substance Use Topics  . Smoking status: Current Everyday Smoker -- 0.5 packs/day    Types: Cigarettes  . Smokeless tobacco: Never Used  . Alcohol Use: No    Allergies: No Known Allergies  No prescriptions prior to admission    Review of Systems  Constitutional: Negative for fever and chills.  HENT: Negative for sore throat.   Eyes: Negative.   Cardiovascular: Negative.   Gastrointestinal: Positive  for abdominal pain. Negative for nausea, vomiting, diarrhea, constipation and anorexia.  Genitourinary: Positive for vaginal discharge. Negative for dysuria, urgency, frequency, hematuria, flank pain and pelvic pain.  Musculoskeletal: Negative for back pain and joint pain.  Skin: Negative for rash.  Neurological: Negative for headaches.   Physical Exam    Blood pressure 111/56, pulse 79, temperature 98.5 F (36.9 C), temperature source Oral, resp. rate 16, height 5' 4.5" (1.638 m), weight 68.04 kg (150 lb), last menstrual period 08/08/2011.  Physical Exam  Constitutional: She is oriented to person, place, and time. She appears well-developed and well-nourished. No distress.  HENT:  Head: Normocephalic and atraumatic.  Eyes: Pupils are equal, round, and reactive to light. Right eye exhibits no discharge. Left eye exhibits no discharge. No scleral icterus.  Cardiovascular: Normal rate, regular rhythm and intact distal pulses.   Respiratory: Effort normal. No respiratory distress.  GI: Soft. She exhibits distension and mass. There is no tenderness. There is no rebound and no guarding.       No tenderness in RUQ; no guarding  Genitourinary: There is no lesion on the right labia. There is no lesion on the left labia. Uterus is enlarged. Cervix exhibits discharge. Cervix exhibits no friability. Right adnexum displays no mass. Left adnexum displays no mass. No signs of injury around the vagina. Vaginal discharge found.  Musculoskeletal: Normal range of motion. She exhibits no edema and no tenderness.  Neurological: She is alert and oriented to person, place, and time.  Skin: Skin is warm and dry. No rash noted. She is not diaphoretic. No erythema. No pallor.  Psychiatric: She has a normal mood and affect. Her behavior is normal. Judgment and thought content normal.    MAU Course  Procedures Results for orders placed during the hospital encounter of 01/22/12 (from the past 24 hour(s))  WET PREP, GENITAL     Status: Abnormal   Collection Time   01/22/12 12:34 PM      Component Value Range   Yeast Wet Prep HPF POC NONE SEEN  NONE SEEN    Trich, Wet Prep NONE SEEN  NONE SEEN    Clue Cells Wet Prep HPF POC FEW (*) NONE SEEN    WBC, Wet Prep HPF POC MODERATE (*) NONE SEEN      MDM +BV on Wet Prep GC/Chlaymidia recollected and pending [place] for  fluid without oligohydramnios.  No clear fluid (only white creamy discharge) from cervical os  Assessment and Plan  Single IUP - insufficient pre natal care - Rx for pre-natal vitamins Pt to call and follow up on medicaid status and follow up with primary OB Rx for Flagyl GC/Chlamydia pending -  Will treat if positive  Case reviewed with Kerrie Buffalo, NP.  Andrena Mews, DO [facility] Resident - PGY-1 01/22/2012 2:11 PM  I have examined this patient and participated in the assessment and plan of care. I have reviewed this patient's vital signs, nurses notes, appropriate labs and imaging.  The patient plans prenatal care with [facility].

## 2012-01-22 NOTE — MAU Note (Signed)
Pt states no pnc thus far, notes vaginal discharge that is yellow, odorous and thick. States has had intermittent lower abd pain, dull at present. Also brought her son Tina [name] here to have bilateral ears examined for earache.

## 2012-01-23 LAB — GC/CHLAMYDIA PROBE AMP, GENITAL
Chlamydia, DNA Probe: NEGATIVE
GC Probe Amp, Genital: NEGATIVE

## 2012-01-23 NOTE — MAU Provider Note (Signed)
Medical Screening exam and patient care preformed by advanced practice provider.  Agree with the above management.  

## 2012-02-04 ENCOUNTER — Other Ambulatory Visit: Payer: Self-pay | Admitting: Obstetrics and Gynecology

## 2012-02-04 ENCOUNTER — Ambulatory Visit (INDEPENDENT_AMBULATORY_CARE_PROVIDER_SITE_OTHER): Payer: Medicaid Other | Admitting: Obstetrics and Gynecology

## 2012-02-04 DIAGNOSIS — Z3689 Encounter for other specified antenatal screening: Secondary | ICD-10-CM

## 2012-02-04 DIAGNOSIS — Z331 Pregnant state, incidental: Secondary | ICD-10-CM

## 2012-02-04 LAB — POCT URINALYSIS DIPSTICK
Glucose, UA: NEGATIVE
Nitrite, UA: NEGATIVE
Spec Grav, UA: 1.01
Urobilinogen, UA: NEGATIVE

## 2012-02-05 ENCOUNTER — Encounter: Payer: Self-pay | Admitting: Obstetrics and Gynecology

## 2012-02-05 DIAGNOSIS — Z6791 Unspecified blood type, Rh negative: Secondary | ICD-10-CM

## 2012-02-05 LAB — CULTURE, OB URINE
Colony Count: NO GROWTH
Organism ID, Bacteria: NO GROWTH

## 2012-02-05 LAB — PRENATAL ANTIBODY IDENTIFICATION

## 2012-02-05 LAB — PRENATAL PANEL VII
Eosinophils Absolute: 0.1 10*3/uL (ref 0.0–0.7)
HIV: NONREACTIVE
Hepatitis B Surface Ag: NEGATIVE
Lymphs Abs: 1.5 10*3/uL (ref 0.7–4.0)
MCH: 29.4 pg (ref 26.0–34.0)
Neutro Abs: 4.2 10*3/uL (ref 1.7–7.7)
Neutrophils Relative %: 69 % (ref 43–77)
Platelets: 182 10*3/uL (ref 150–400)
RBC: 4.01 MIL/uL (ref 3.87–5.11)
Rubella: 68 IU/mL — ABNORMAL HIGH
WBC: 6.1 10*3/uL (ref 4.0–10.5)

## 2012-02-05 LAB — ANTIBODY TITER (PRENATAL TITER)

## 2012-02-06 ENCOUNTER — Telehealth: Payer: Self-pay | Admitting: Obstetrics and Gynecology

## 2012-02-06 ENCOUNTER — Other Ambulatory Visit: Payer: Self-pay | Admitting: Obstetrics and Gynecology

## 2012-02-06 DIAGNOSIS — R760 Raised antibody titer: Secondary | ICD-10-CM

## 2012-02-06 NOTE — Telephone Encounter (Signed)
TC to pt.  Informed of referral to MFM per DR AVS. Sched 02/11/12 at 1:00.   Pt verbalizes comprehension.

## 2012-02-11 ENCOUNTER — Ambulatory Visit (HOSPITAL_COMMUNITY): Admission: RE | Admit: 2012-02-11 | Payer: Self-pay | Source: Ambulatory Visit

## 2012-02-11 LAB — HEMOGLOBINOPATHY EVALUATION
Hemoglobin Other: 0 %
Hgb F Quant: 0 % (ref 0.0–2.0)
Hgb S Quant: 0 %

## 2012-02-18 ENCOUNTER — Ambulatory Visit: Payer: Medicaid Other

## 2012-02-18 ENCOUNTER — Telehealth: Payer: Self-pay

## 2012-02-18 ENCOUNTER — Encounter (HOSPITAL_COMMUNITY): Payer: Self-pay

## 2012-02-18 ENCOUNTER — Ambulatory Visit (HOSPITAL_COMMUNITY)
Admission: RE | Admit: 2012-02-18 | Discharge: 2012-02-18 | Disposition: A | Discharge status: Home / Self Care | Payer: Medicaid Other | Source: Ambulatory Visit | Attending: Obstetrics and Gynecology | Admitting: Obstetrics and Gynecology

## 2012-02-18 ENCOUNTER — Other Ambulatory Visit: Payer: Self-pay | Admitting: Obstetrics and Gynecology

## 2012-02-18 ENCOUNTER — Encounter: Payer: Medicaid Other | Admitting: Obstetrics and Gynecology

## 2012-02-18 DIAGNOSIS — O34219 Maternal care for unspecified type scar from previous cesarean delivery: Secondary | ICD-10-CM | POA: Insufficient documentation

## 2012-02-18 DIAGNOSIS — O360990 Maternal care for other rhesus isoimmunization, unspecified trimester, not applicable or unspecified: Secondary | ICD-10-CM | POA: Insufficient documentation

## 2012-02-18 DIAGNOSIS — O360190 Maternal care for anti-D [Rh] antibodies, unspecified trimester, not applicable or unspecified: Secondary | ICD-10-CM

## 2012-02-18 NOTE — Progress Notes (Signed)
MFM Note  Tina [name] is a 31 year old G5P4 (or G42P4A1? #2 pregnancy: SAB?) AA female at 27+ weeks who presents for consultation regarding her H/O Rh alloimmunization. History was  obtained from the patient and available notes in Epic and iSite. Tina [name]' operative report from her first recorded pregnancy stated that she was Rh sensitized (07/17/04). It is not clear whether her antibody screen was positive at her initial prenatal visit or whether she became sensitized sometime during the pregnancy. 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 In 2010, her H&P and operative note indicated that a repeat C/S was recommended at 38 weeks due to her Rh alloimmunization by [facility]. Her most recent repeat C/S on 02/28/2011 was performed at 37 weeks again because of her anti-D antibodies. Data from the iSite program indicated that she was followed with MCA dopplers in 2005, 2010 and 2012. Her MCA MoMs never were > 1.5 and therefore, she never required an intrauterine fetal blood transfusion. A antibody titer from 2008 was 8 and one obtained on 0612/13 was 32. Tina [name] reports that none of her children were affected by the antibodies; however, two required bili lights for jaundice.  So far with this pregnancy, she has received no prenatal care. She has had two USs at [facility] at 15 and 23 weeks. Her plans are to begin care at [facility] very soon. The FOB is not available for RBC genotyping.  A limited US was performed today just to evaluate the fetus for anemia. The MCA PSV MoM was 1.1 which indicated no fetal anemia.  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  (Face-to-face consultation with patient: 40 min)

## 2012-02-18 NOTE — Telephone Encounter (Signed)
Marylene Land from MFM CB# [phone] called asking permission to do an Unlimited U/S and  MCA dopplers. Pt had a scheduled u/s @ 2pm here in the office. Pt also had a scheduled NOB work-up. Pt is at MFM at this time and have to contact our office to R/S her U/S and NOB work up if she is more than late for appt. Per AVS it was 'OK" for pt to have doppler done. Per Marylene Land pt was late for her scheduled appt with them.

## 2012-02-27 ENCOUNTER — Telehealth: Payer: Self-pay | Admitting: Obstetrics and Gynecology

## 2012-03-02 NOTE — Telephone Encounter (Signed)
Tc to pt regarding msg, pt advised to keep appt for U/S @ MFM, because pt has not had NOB work up, will sched that appt on Monday 03/08/12 @ 1400 w/ DD, pt voices understanding.

## 2012-03-04 ENCOUNTER — Other Ambulatory Visit (HOSPITAL_COMMUNITY): Payer: Self-pay | Admitting: Maternal and Fetal Medicine

## 2012-03-04 ENCOUNTER — Ambulatory Visit (HOSPITAL_COMMUNITY)
Admission: RE | Admit: 2012-03-04 | Discharge: 2012-03-04 | Disposition: A | Discharge status: Home / Self Care | Payer: Medicaid Other | Source: Ambulatory Visit | Attending: Obstetrics and Gynecology | Admitting: Obstetrics and Gynecology

## 2012-03-04 ENCOUNTER — Encounter (HOSPITAL_COMMUNITY): Payer: Self-pay

## 2012-03-04 VITALS — BP 112/68 | HR 90 | Wt 152.0 lb

## 2012-03-04 DIAGNOSIS — O34219 Maternal care for unspecified type scar from previous cesarean delivery: Secondary | ICD-10-CM | POA: Insufficient documentation

## 2012-03-04 DIAGNOSIS — O09299 Supervision of pregnancy with other poor reproductive or obstetric history, unspecified trimester: Secondary | ICD-10-CM | POA: Insufficient documentation

## 2012-03-04 DIAGNOSIS — Z1389 Encounter for screening for other disorder: Secondary | ICD-10-CM | POA: Insufficient documentation

## 2012-03-04 DIAGNOSIS — O360190 Maternal care for anti-D [Rh] antibodies, unspecified trimester, not applicable or unspecified: Secondary | ICD-10-CM

## 2012-03-04 DIAGNOSIS — O358XX0 Maternal care for other (suspected) fetal abnormality and damage, not applicable or unspecified: Secondary | ICD-10-CM | POA: Insufficient documentation

## 2012-03-04 DIAGNOSIS — Z363 Encounter for antenatal screening for malformations: Secondary | ICD-10-CM | POA: Insufficient documentation

## 2012-03-04 DIAGNOSIS — O0930 Supervision of pregnancy with insufficient antenatal care, unspecified trimester: Secondary | ICD-10-CM | POA: Insufficient documentation

## 2012-03-04 NOTE — Progress Notes (Signed)
Patient seen today  for follow up ultrasound.  See full report in [facility]-OB/GYN.  Tina Gula, MD  Testing performed today for fetal blood typing.  Single IUP at 29 6/7 weeks Normal detailed fetal anatomy MCA Dopplers: 1.03 MOM (unaffected) No evidence of fetal hydrops The estimated fetal weight today is at the 24th %tile.  The [facility] is currently measuring at the 3rd %tile. Normal umbilical artery Doppler studies without evidence of AEDF or reversed flow Normal amniotic fluid volume noted  Recommend continued weekly MCA Doppler studies pending results of fetal blood type testing.  Recommend follow up growth scan in 3 weeks due to lagging AC/ asymmetric growth pattern.

## 2012-03-05 ENCOUNTER — Other Ambulatory Visit: Payer: Self-pay

## 2012-03-08 ENCOUNTER — Encounter: Payer: Medicaid Other | Admitting: Obstetrics and Gynecology

## 2012-03-11 ENCOUNTER — Ambulatory Visit (HOSPITAL_COMMUNITY)
Admission: RE | Admit: 2012-03-11 | Discharge: 2012-03-11 | Disposition: A | Discharge status: Home / Self Care | Payer: Medicaid Other | Source: Ambulatory Visit | Attending: Obstetrics and Gynecology | Admitting: Obstetrics and Gynecology

## 2012-03-11 ENCOUNTER — Encounter (HOSPITAL_COMMUNITY): Payer: Self-pay

## 2012-03-11 VITALS — BP 110/67 | HR 93 | Wt 153.5 lb

## 2012-03-11 DIAGNOSIS — O360190 Maternal care for anti-D [Rh] antibodies, unspecified trimester, not applicable or unspecified: Secondary | ICD-10-CM

## 2012-03-11 DIAGNOSIS — O09299 Supervision of pregnancy with other poor reproductive or obstetric history, unspecified trimester: Secondary | ICD-10-CM | POA: Insufficient documentation

## 2012-03-11 DIAGNOSIS — O0930 Supervision of pregnancy with insufficient antenatal care, unspecified trimester: Secondary | ICD-10-CM | POA: Insufficient documentation

## 2012-03-11 DIAGNOSIS — O34219 Maternal care for unspecified type scar from previous cesarean delivery: Secondary | ICD-10-CM | POA: Insufficient documentation

## 2012-03-17 ENCOUNTER — Ambulatory Visit (HOSPITAL_COMMUNITY)
Admission: RE | Admit: 2012-03-17 | Discharge: 2012-03-17 | Disposition: A | Discharge status: Home / Self Care | Payer: Medicaid Other | Source: Ambulatory Visit | Attending: Obstetrics and Gynecology | Admitting: Obstetrics and Gynecology

## 2012-03-17 VITALS — BP 104/59 | HR 83 | Wt 153.0 lb

## 2012-03-17 DIAGNOSIS — O09299 Supervision of pregnancy with other poor reproductive or obstetric history, unspecified trimester: Secondary | ICD-10-CM | POA: Insufficient documentation

## 2012-03-17 DIAGNOSIS — O0930 Supervision of pregnancy with insufficient antenatal care, unspecified trimester: Secondary | ICD-10-CM | POA: Insufficient documentation

## 2012-03-17 DIAGNOSIS — O34219 Maternal care for unspecified type scar from previous cesarean delivery: Secondary | ICD-10-CM | POA: Insufficient documentation

## 2012-03-17 DIAGNOSIS — O360190 Maternal care for anti-D [Rh] antibodies, unspecified trimester, not applicable or unspecified: Secondary | ICD-10-CM

## 2012-03-18 ENCOUNTER — Inpatient Hospital Stay (HOSPITAL_COMMUNITY): Admission: RE | Admit: 2012-03-18 | Payer: Medicaid Other | Source: Ambulatory Visit

## 2012-03-18 ENCOUNTER — Ambulatory Visit (HOSPITAL_COMMUNITY): Payer: Medicaid Other

## 2012-03-25 ENCOUNTER — Other Ambulatory Visit (HOSPITAL_COMMUNITY): Payer: Self-pay | Admitting: Maternal and Fetal Medicine

## 2012-03-25 ENCOUNTER — Ambulatory Visit (HOSPITAL_COMMUNITY)
Admission: RE | Admit: 2012-03-25 | Discharge: 2012-03-25 | Disposition: A | Discharge status: Home / Self Care | Payer: Medicaid Other | Source: Ambulatory Visit | Attending: Obstetrics and Gynecology | Admitting: Obstetrics and Gynecology

## 2012-03-25 DIAGNOSIS — O09299 Supervision of pregnancy with other poor reproductive or obstetric history, unspecified trimester: Secondary | ICD-10-CM | POA: Insufficient documentation

## 2012-03-25 DIAGNOSIS — O360190 Maternal care for anti-D [Rh] antibodies, unspecified trimester, not applicable or unspecified: Secondary | ICD-10-CM

## 2012-03-25 DIAGNOSIS — IMO0002 Reserved for concepts with insufficient information to code with codable children: Secondary | ICD-10-CM

## 2012-03-25 DIAGNOSIS — O34219 Maternal care for unspecified type scar from previous cesarean delivery: Secondary | ICD-10-CM | POA: Insufficient documentation

## 2012-03-25 DIAGNOSIS — O0930 Supervision of pregnancy with insufficient antenatal care, unspecified trimester: Secondary | ICD-10-CM | POA: Insufficient documentation

## 2012-03-25 DIAGNOSIS — O365990 Maternal care for other known or suspected poor fetal growth, unspecified trimester, not applicable or unspecified: Secondary | ICD-10-CM | POA: Insufficient documentation

## 2012-03-31 ENCOUNTER — Encounter: Payer: Medicaid Other | Admitting: Obstetrics and Gynecology

## 2012-04-01 ENCOUNTER — Ambulatory Visit (HOSPITAL_COMMUNITY): Payer: Medicaid Other

## 2012-04-02 ENCOUNTER — Ambulatory Visit (HOSPITAL_COMMUNITY): Payer: Medicaid Other | Attending: Obstetrics and Gynecology

## 2012-04-02 ENCOUNTER — Ambulatory Visit (HOSPITAL_COMMUNITY): Payer: Medicaid Other

## 2012-04-05 ENCOUNTER — Other Ambulatory Visit (HOSPITAL_COMMUNITY): Payer: Self-pay | Admitting: Maternal and Fetal Medicine

## 2012-04-05 DIAGNOSIS — O360190 Maternal care for anti-D [Rh] antibodies, unspecified trimester, not applicable or unspecified: Secondary | ICD-10-CM

## 2012-04-07 ENCOUNTER — Ambulatory Visit (INDEPENDENT_AMBULATORY_CARE_PROVIDER_SITE_OTHER): Payer: Medicaid Other | Admitting: Obstetrics and Gynecology

## 2012-04-07 ENCOUNTER — Ambulatory Visit (HOSPITAL_COMMUNITY)
Admission: RE | Admit: 2012-04-07 | Discharge: 2012-04-07 | Disposition: A | Discharge status: Home / Self Care | Payer: Medicaid Other | Source: Ambulatory Visit | Attending: Obstetrics and Gynecology | Admitting: Obstetrics and Gynecology

## 2012-04-07 ENCOUNTER — Encounter: Payer: Self-pay | Admitting: Obstetrics and Gynecology

## 2012-04-07 ENCOUNTER — Encounter (HOSPITAL_COMMUNITY): Payer: Self-pay

## 2012-04-07 VITALS — BP 104/58 | Wt 156.0 lb

## 2012-04-07 DIAGNOSIS — Z3490 Encounter for supervision of normal pregnancy, unspecified, unspecified trimester: Secondary | ICD-10-CM

## 2012-04-07 DIAGNOSIS — O34219 Maternal care for unspecified type scar from previous cesarean delivery: Secondary | ICD-10-CM

## 2012-04-07 DIAGNOSIS — Z331 Pregnant state, incidental: Secondary | ICD-10-CM

## 2012-04-07 DIAGNOSIS — O09299 Supervision of pregnancy with other poor reproductive or obstetric history, unspecified trimester: Secondary | ICD-10-CM | POA: Insufficient documentation

## 2012-04-07 DIAGNOSIS — O0930 Supervision of pregnancy with insufficient antenatal care, unspecified trimester: Secondary | ICD-10-CM | POA: Insufficient documentation

## 2012-04-07 DIAGNOSIS — O365990 Maternal care for other known or suspected poor fetal growth, unspecified trimester, not applicable or unspecified: Secondary | ICD-10-CM | POA: Insufficient documentation

## 2012-04-07 DIAGNOSIS — O360190 Maternal care for anti-D [Rh] antibodies, unspecified trimester, not applicable or unspecified: Secondary | ICD-10-CM

## 2012-04-07 NOTE — Patient Instructions (Signed)
Sterilization, Women Sterilization is a surgical procedure. This surgery permanently prevents pregnancy in women. This can be done by tying (with or without cutting) the fallopian tubes or burning the tubes closed (tubal ligation). Tubal ligation blocks the tubes and prevents the egg from being fertilized by the sperm. Sterilization can be done by removing the ovaries that produce the egg (castration) as well. Sterilization is considered safe with very rare complications. It does not affect menstrual periods, sexual desire, or performance.  Since sterilization is considered permanent, you should not do it until you are sure you do not want to have more children. You and your partner should fully agree to have the procedure. Your decision to have the procedure should not be made when you are in a stressful situation. This can include a loss of a pregnancy, illness or death of a spouse, or divorce. There are other means of preventing unwanted pregnancies that can be used until you are completely sure you want to be sterilized. Sterilization does not protect against sexually transmitted disease. Women who had a sterilization procedure and want it reversed must know that it requires an expensive and major operation. The reversal may not be successful and has a high rate of tubal (ectopic) pregnancy that can be dangerous and require surgery. There are several ways to perform a tubal sterlization:  Laparoscopy. The abdomen is filled with a gas to see the pelvic organs. Then, a tube with a light attached is inserted into the abdomen through 2 small incisions. The fallopian tubes are blocked with a ring, clip or electrocautery to burn closed the tubes. Then, the gas is released and the small incisions are closed.   Hysteroscopy. A tube with a light is inserted in the vagina, through the cervix and then into the uterus. A spring-like instrument is inserted into the opening of the fallopian tubes. The spring causes  scaring and blocks the tubes. Other forms of contraception should be used for three months at which time an X-ray is done to be sure the tubes are blocked.   Minilaparotomy. This is done right after giving birth. A small incision is made under the belly button and the tubes are exposed. The tubes can then be burned, tied and/or cut.   Tubal ligation can be done during a Cesarean section.   Castration is a surgical procedure that removes both ovaries.  Tubal sterilization should be discussed with your caregiver to answer any concerns you or your partner might have. This meeting will help to decide for sure if the operation is safe for you and which procedure is the best one for you. You can change your mind and cancel the surgery at any time. HOME CARE INSTRUCTIONS   Follow your caregivers instructions regarding diet, rest, work, social and sexual activities and follow up appointments.   Shoulder pain is common following a laparoscopy. The pain may be relieved by lying down flat.   Only take over-the-counter or prescription medicines for pain, discomfort or fever as directed by your caregiver.   You may use lozenges for throat discomfort.   Keep the incisions covered to prevent infection.  SEEK IMMEDIATE MEDICAL CARE IF:   You develop a temperature of 102 F (38.9 C), or as your caregiver suggests.   You become dizzy or faint.   You start to feel sick to your stomach (nausea) or throw up (vomit).   You develop abdominal pain not relieved with over-the-counter medications.   You have redness and puffiness (  swelling) of the cut (incision).   You see pus draining from the incision.   You miss a menstrual period.  Document Released: 01/28/2008 Document Revised: 07/31/2011 Document Reviewed: 01/28/2008 Exit[employer] Patient Information 2012 [employer]. 

## 2012-04-07 NOTE — Progress Notes (Signed)
?   Questions about delivering at 37wks per mfm recommendation.

## 2012-04-07 NOTE — Progress Notes (Signed)
[redacted]w[redacted]d No complaints. 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 The final decision will be with [facility] Desires BTL. Will need to sign papers today. ROB x 2 weeks.

## 2012-04-08 ENCOUNTER — Encounter: Payer: Self-pay | Admitting: Obstetrics and Gynecology

## 2012-04-08 NOTE — Progress Notes (Signed)
Tina [name] was seen for ultrasound appointment today.  Please see [facility]-OBGYN report for details.

## 2012-04-15 ENCOUNTER — Other Ambulatory Visit (HOSPITAL_COMMUNITY): Payer: Self-pay | Admitting: Maternal and Fetal Medicine

## 2012-04-15 ENCOUNTER — Ambulatory Visit (HOSPITAL_COMMUNITY)
Admission: RE | Admit: 2012-04-15 | Discharge: 2012-04-15 | Disposition: A | Discharge status: Home / Self Care | Payer: Medicaid Other | Source: Ambulatory Visit | Attending: Obstetrics and Gynecology | Admitting: Obstetrics and Gynecology

## 2012-04-15 DIAGNOSIS — O360190 Maternal care for anti-D [Rh] antibodies, unspecified trimester, not applicable or unspecified: Secondary | ICD-10-CM

## 2012-04-15 DIAGNOSIS — IMO0002 Reserved for concepts with insufficient information to code with codable children: Secondary | ICD-10-CM

## 2012-04-15 DIAGNOSIS — O0930 Supervision of pregnancy with insufficient antenatal care, unspecified trimester: Secondary | ICD-10-CM | POA: Insufficient documentation

## 2012-04-15 DIAGNOSIS — O365990 Maternal care for other known or suspected poor fetal growth, unspecified trimester, not applicable or unspecified: Secondary | ICD-10-CM | POA: Insufficient documentation

## 2012-04-15 DIAGNOSIS — O34219 Maternal care for unspecified type scar from previous cesarean delivery: Secondary | ICD-10-CM | POA: Insufficient documentation

## 2012-04-15 NOTE — Progress Notes (Signed)
Tina [name]  was seen today for an ultrasound appointment.  See full report in [facility]N.  Alpha Gula, MD  Single living intrauterine pregnancy at 35 weeks 6 days. Normal amniotic fluid volume. Normal MCA dopplers (1.11 MoM). No evidence of hydrops. IUGR noted with an estimated fetal weight today is < 10th %tile; the [facility] is < 3rd %tile Normal umbilical artery Doppler studies for this gestational age; no evidence of absent or reversed diastolic flow  Recommend 2x weekly NSTS. Patient currently scheduled for repeat C-section at 37 weeks.

## 2012-04-16 ENCOUNTER — Ambulatory Visit (HOSPITAL_COMMUNITY): Payer: Medicaid Other

## 2012-04-19 ENCOUNTER — Encounter (HOSPITAL_COMMUNITY): Payer: Self-pay | Admitting: Pharmacist

## 2012-04-19 ENCOUNTER — Telehealth: Payer: Self-pay | Admitting: Obstetrics and Gynecology

## 2012-04-19 ENCOUNTER — Ambulatory Visit (HOSPITAL_COMMUNITY): Admission: RE | Admit: 2012-04-19 | Payer: Medicaid Other | Source: Ambulatory Visit

## 2012-04-19 ENCOUNTER — Other Ambulatory Visit: Payer: Self-pay | Admitting: Obstetrics and Gynecology

## 2012-04-19 NOTE — Telephone Encounter (Signed)
Repeat C/S BTL scheduled for 04/23/12 @ 2:00 with [facility].  -Adrianne Pridgen

## 2012-04-20 ENCOUNTER — Telehealth (HOSPITAL_COMMUNITY): Payer: Self-pay | Admitting: *Deleted

## 2012-04-20 ENCOUNTER — Encounter: Payer: Self-pay | Admitting: Obstetrics and Gynecology

## 2012-04-20 ENCOUNTER — Ambulatory Visit (INDEPENDENT_AMBULATORY_CARE_PROVIDER_SITE_OTHER): Payer: Medicaid Other | Admitting: Obstetrics and Gynecology

## 2012-04-20 ENCOUNTER — Ambulatory Visit (HOSPITAL_COMMUNITY): Admission: RE | Admit: 2012-04-20 | Payer: Medicaid Other | Source: Ambulatory Visit

## 2012-04-20 VITALS — BP 104/60 | Wt 152.0 lb

## 2012-04-20 DIAGNOSIS — O0930 Supervision of pregnancy with insufficient antenatal care, unspecified trimester: Secondary | ICD-10-CM

## 2012-04-20 DIAGNOSIS — O34219 Maternal care for unspecified type scar from previous cesarean delivery: Secondary | ICD-10-CM

## 2012-04-20 DIAGNOSIS — Z1589 Genetic susceptibility to other disease: Secondary | ICD-10-CM

## 2012-04-20 DIAGNOSIS — Z3480 Encounter for supervision of other normal pregnancy, unspecified trimester: Secondary | ICD-10-CM

## 2012-04-20 NOTE — Progress Notes (Signed)
NST reactive Last EFW 4+lbs last thurs at [facility] and Labor Precautions RLTCS on Fri GBS/GC/CT today Desires Mirena

## 2012-04-20 NOTE — Telephone Encounter (Signed)
Pt called to say she overslept for her NST today.  States she has an appointment with [facility] today.  Instructed pt to keep her appointment with [facility] this afternoon and I will call and let them know she missed her appointment.  Explained to pt that our NST schedule is full this afternoon and I am unable to work her in but I will let her physicians know. Left message with Dr. Lilian Coma nurse.

## 2012-04-21 ENCOUNTER — Encounter: Payer: Medicaid Other | Admitting: Obstetrics and Gynecology

## 2012-04-21 LAB — GC/CHLAMYDIA PROBE AMP, GENITAL
Chlamydia, DNA Probe: NEGATIVE
GC Probe Amp, Genital: NEGATIVE

## 2012-04-22 ENCOUNTER — Encounter (HOSPITAL_COMMUNITY): Payer: Self-pay

## 2012-04-22 ENCOUNTER — Encounter (HOSPITAL_COMMUNITY)
Admission: RE | Admit: 2012-04-22 | Discharge: 2012-04-22 | Disposition: A | Discharge status: Home / Self Care | Payer: Medicaid Other | Source: Ambulatory Visit | Attending: Obstetrics and Gynecology | Admitting: Obstetrics and Gynecology

## 2012-04-22 ENCOUNTER — Other Ambulatory Visit (HOSPITAL_COMMUNITY): Payer: Medicaid Other

## 2012-04-22 LAB — SURGICAL PCR SCREEN: MRSA, PCR: NEGATIVE

## 2012-04-22 LAB — CBC
HCT: 33.1 % — ABNORMAL LOW (ref 36.0–46.0)
MCHC: 33.5 g/dL (ref 30.0–36.0)
MCV: 90.9 fL (ref 78.0–100.0)
RDW: 13.2 % (ref 11.5–15.5)

## 2012-04-22 NOTE — Patient Instructions (Signed)
Your procedure is scheduled on:04/24/11  Enter through the Main Entrance at :1230 pm Pick up desk phone and dial [phone] and inform us of your arrival.  Please call [phone] if you have any problems the morning of surgery.  Remember: Do not eat after midnight: tonight Do not drink after:10am Friday- clear liquids only  Take these meds the morning of surgery with a sip of water:  DO NOT wear jewelry, eye make-up, lipstick,body lotion, or dark fingernail polish. Do not shave for 48 hours prior to surgery.  If you are to be admitted after surgery, leave suitcase in car until your room has been assigned. Patients discharged on the day of surgery will not be allowed to drive home.   Remember to use your Hibiclens as instructed.

## 2012-04-23 ENCOUNTER — Inpatient Hospital Stay (HOSPITAL_COMMUNITY): Payer: Medicaid Other

## 2012-04-23 ENCOUNTER — Encounter (HOSPITAL_COMMUNITY): Payer: Self-pay

## 2012-04-23 ENCOUNTER — Encounter (HOSPITAL_COMMUNITY): Payer: Self-pay | Admitting: Registered Nurse

## 2012-04-23 ENCOUNTER — Encounter (HOSPITAL_COMMUNITY): Payer: Self-pay | Admitting: *Deleted

## 2012-04-23 ENCOUNTER — Encounter (HOSPITAL_COMMUNITY)
Admission: RE | Disposition: A | Discharge status: Home / Self Care | Payer: Self-pay | Source: Ambulatory Visit | Attending: Obstetrics and Gynecology

## 2012-04-23 ENCOUNTER — Inpatient Hospital Stay (HOSPITAL_COMMUNITY)
Admission: RE | Admit: 2012-04-23 | Discharge: 2012-04-26 | DRG: 765 | Disposition: A | Discharge status: Home / Self Care | Payer: Medicaid Other | Source: Ambulatory Visit | Attending: Obstetrics and Gynecology | Admitting: Obstetrics and Gynecology

## 2012-04-23 DIAGNOSIS — F1210 Cannabis abuse, uncomplicated: Secondary | ICD-10-CM | POA: Diagnosis present

## 2012-04-23 DIAGNOSIS — Z302 Encounter for sterilization: Secondary | ICD-10-CM

## 2012-04-23 DIAGNOSIS — O9903 Anemia complicating the puerperium: Secondary | ICD-10-CM | POA: Diagnosis not present

## 2012-04-23 DIAGNOSIS — Z01818 Encounter for other preprocedural examination: Secondary | ICD-10-CM

## 2012-04-23 DIAGNOSIS — F17200 Nicotine dependence, unspecified, uncomplicated: Secondary | ICD-10-CM | POA: Diagnosis present

## 2012-04-23 DIAGNOSIS — Z98891 History of uterine scar from previous surgery: Secondary | ICD-10-CM

## 2012-04-23 DIAGNOSIS — O360990 Maternal care for other rhesus isoimmunization, unspecified trimester, not applicable or unspecified: Secondary | ICD-10-CM | POA: Diagnosis present

## 2012-04-23 DIAGNOSIS — D649 Anemia, unspecified: Secondary | ICD-10-CM | POA: Diagnosis not present

## 2012-04-23 DIAGNOSIS — O34219 Maternal care for unspecified type scar from previous cesarean delivery: Secondary | ICD-10-CM

## 2012-04-23 DIAGNOSIS — O365990 Maternal care for other known or suspected poor fetal growth, unspecified trimester, not applicable or unspecified: Secondary | ICD-10-CM | POA: Diagnosis present

## 2012-04-23 DIAGNOSIS — Z01812 Encounter for preprocedural laboratory examination: Secondary | ICD-10-CM

## 2012-04-23 DIAGNOSIS — O99334 Smoking (tobacco) complicating childbirth: Secondary | ICD-10-CM | POA: Diagnosis present

## 2012-04-23 HISTORY — DX: History of uterine scar from previous surgery: Z98.891

## 2012-04-23 HISTORY — DX: Maternal care for other rhesus isoimmunization, unspecified trimester, not applicable or unspecified: O36.0990

## 2012-04-23 LAB — CULTURE, BETA STREP (GROUP B ONLY)

## 2012-04-23 SURGERY — Surgical Case
Anesthesia: Spinal | Site: Abdomen | Wound class: Clean Contaminated

## 2012-04-23 MED ORDER — SIMETHICONE 80 MG PO CHEW: 80.0000 mg | CHEWABLE_TABLET | ORAL | Status: DC | PRN

## 2012-04-23 MED ORDER — PHENYLEPHRINE 40 MCG/ML (10ML) SYRINGE FOR IV PUSH (FOR BLOOD PRESSURE SUPPORT)
PREFILLED_SYRINGE | INTRAVENOUS | Status: AC
  Filled 2012-04-23: qty 5

## 2012-04-23 MED ORDER — LACTATED RINGERS IV SOLN
INTRAVENOUS | Status: DC
  Administered 2012-04-24: 03:00:00 via INTRAVENOUS

## 2012-04-23 MED ORDER — DIPHENHYDRAMINE HCL 50 MG/ML IJ SOLN: 25.0000 mg | INTRAMUSCULAR | Status: DC | PRN

## 2012-04-23 MED ORDER — SODIUM CHLORIDE 0.9 % IV SOLN
1.0000 ug/kg/h | INTRAVENOUS | Status: DC | PRN
  Filled 2012-04-23: qty 2.5

## 2012-04-23 MED ORDER — KETOROLAC TROMETHAMINE 60 MG/2ML IM SOLN
60.0000 mg | Freq: Once | INTRAMUSCULAR | Status: AC | PRN
  Administered 2012-04-23: 60 mg via INTRAMUSCULAR

## 2012-04-23 MED ORDER — TETANUS-DIPHTH-ACELL PERTUSSIS 5-2.5-18.5 LF-MCG/0.5 IM SUSP
0.5000 mL | Freq: Once | INTRAMUSCULAR | Status: AC
  Administered 2012-04-24: 0.5 mL via INTRAMUSCULAR
  Filled 2012-04-23: qty 0.5

## 2012-04-23 MED ORDER — DIPHENHYDRAMINE HCL 50 MG/ML IJ SOLN
INTRAMUSCULAR | Status: DC | PRN
  Administered 2012-04-23: 12.5 mg via INTRAVENOUS

## 2012-04-23 MED ORDER — KETOROLAC TROMETHAMINE 30 MG/ML IJ SOLN: 30.0000 mg | Freq: Four times a day (QID) | INTRAMUSCULAR | Status: AC | PRN

## 2012-04-23 MED ORDER — BUPIVACAINE IN DEXTROSE 0.75-8.25 % IT SOLN
INTRATHECAL | Status: DC | PRN
  Administered 2012-04-23: 1.4 mg via INTRATHECAL

## 2012-04-23 MED ORDER — PHENYLEPHRINE HCL 10 MG/ML IJ SOLN
INTRAMUSCULAR | Status: DC | PRN
  Administered 2012-04-23: 40 ug via INTRAVENOUS
  Administered 2012-04-23: 80 ug via INTRAVENOUS
  Administered 2012-04-23 (×3): 40 ug via INTRAVENOUS

## 2012-04-23 MED ORDER — IBUPROFEN 600 MG PO TABS
600.0000 mg | ORAL_TABLET | Freq: Four times a day (QID) | ORAL | Status: DC
  Administered 2012-04-24 – 2012-04-26 (×10): 600 mg via ORAL
  Filled 2012-04-23 (×3): qty 1

## 2012-04-23 MED ORDER — NALBUPHINE HCL 10 MG/ML IJ SOLN
5.0000 mg | INTRAMUSCULAR | Status: DC | PRN
  Filled 2012-04-23: qty 1

## 2012-04-23 MED ORDER — MORPHINE SULFATE (PF) 0.5 MG/ML IJ SOLN
INTRAMUSCULAR | Status: DC | PRN
  Administered 2012-04-23: .15 mg via INTRATHECAL

## 2012-04-23 MED ORDER — OXYTOCIN 40 UNITS IN LACTATED RINGERS INFUSION - SIMPLE MED: 62.5000 mL/h | INTRAVENOUS | Status: AC

## 2012-04-23 MED ORDER — DIPHENHYDRAMINE HCL 25 MG PO CAPS: 25.0000 mg | ORAL_CAPSULE | Freq: Four times a day (QID) | ORAL | Status: DC | PRN

## 2012-04-23 MED ORDER — METOCLOPRAMIDE HCL 5 MG/ML IJ SOLN: 10.0000 mg | Freq: Three times a day (TID) | INTRAMUSCULAR | Status: DC | PRN

## 2012-04-23 MED ORDER — SCOPOLAMINE 1 MG/3DAYS TD PT72
MEDICATED_PATCH | TRANSDERMAL | Status: AC
  Administered 2012-04-23: 1.5 mg via TRANSDERMAL
  Filled 2012-04-23: qty 1

## 2012-04-23 MED ORDER — MENTHOL 3 MG MT LOZG: 1.0000 | LOZENGE | OROMUCOSAL | Status: DC | PRN

## 2012-04-23 MED ORDER — LANOLIN HYDROUS EX OINT: 1.0000 "application " | TOPICAL_OINTMENT | CUTANEOUS | Status: DC | PRN

## 2012-04-23 MED ORDER — ZOLPIDEM TARTRATE 5 MG PO TABS: 5.0000 mg | ORAL_TABLET | Freq: Every evening | ORAL | Status: DC | PRN

## 2012-04-23 MED ORDER — HYDROMORPHONE HCL PF 1 MG/ML IJ SOLN: 0.2500 mg | INTRAMUSCULAR | Status: DC | PRN

## 2012-04-23 MED ORDER — FENTANYL CITRATE 0.05 MG/ML IJ SOLN
INTRAMUSCULAR | Status: DC | PRN
  Administered 2012-04-23: 25 ug via INTRATHECAL

## 2012-04-23 MED ORDER — MORPHINE SULFATE 0.5 MG/ML IJ SOLN
INTRAMUSCULAR | Status: AC
  Filled 2012-04-23: qty 10

## 2012-04-23 MED ORDER — DIPHENHYDRAMINE HCL 50 MG/ML IJ SOLN: 12.5000 mg | INTRAMUSCULAR | Status: DC | PRN

## 2012-04-23 MED ORDER — PRENATAL MULTIVITAMIN CH
1.0000 | ORAL_TABLET | Freq: Every day | ORAL | Status: DC
  Administered 2012-04-24 – 2012-04-26 (×3): 1 via ORAL
  Filled 2012-04-23 (×3): qty 1

## 2012-04-23 MED ORDER — LACTATED RINGERS IV SOLN
INTRAVENOUS | Status: DC | PRN
  Administered 2012-04-23 (×4): via INTRAVENOUS

## 2012-04-23 MED ORDER — DIBUCAINE 1 % RE OINT: 1.0000 "application " | TOPICAL_OINTMENT | RECTAL | Status: DC | PRN

## 2012-04-23 MED ORDER — NALOXONE HCL 0.4 MG/ML IJ SOLN: 0.4000 mg | INTRAMUSCULAR | Status: DC | PRN

## 2012-04-23 MED ORDER — WITCH HAZEL-GLYCERIN EX PADS: 1.0000 "application " | MEDICATED_PAD | CUTANEOUS | Status: DC | PRN

## 2012-04-23 MED ORDER — DIPHENHYDRAMINE HCL 50 MG/ML IJ SOLN
INTRAMUSCULAR | Status: AC
  Filled 2012-04-23: qty 1

## 2012-04-23 MED ORDER — CEFAZOLIN SODIUM-DEXTROSE 2-3 GM-% IV SOLR
INTRAVENOUS | Status: AC
  Administered 2012-04-23: 2 g via INTRAVENOUS
  Filled 2012-04-23: qty 50

## 2012-04-23 MED ORDER — SIMETHICONE 80 MG PO CHEW
80.0000 mg | CHEWABLE_TABLET | Freq: Three times a day (TID) | ORAL | Status: DC
  Administered 2012-04-23 – 2012-04-26 (×10): 80 mg via ORAL

## 2012-04-23 MED ORDER — OXYTOCIN 10 UNIT/ML IJ SOLN
40.0000 [IU] | INTRAVENOUS | Status: DC | PRN
  Administered 2012-04-23: 40 [IU] via INTRAVENOUS

## 2012-04-23 MED ORDER — CEFAZOLIN SODIUM-DEXTROSE 2-3 GM-% IV SOLR: 2.0000 g | INTRAVENOUS | Status: DC

## 2012-04-23 MED ORDER — SCOPOLAMINE 1 MG/3DAYS TD PT72: 1.0000 | MEDICATED_PATCH | Freq: Once | TRANSDERMAL | Status: DC

## 2012-04-23 MED ORDER — MEPERIDINE HCL 25 MG/ML IJ SOLN: 6.2500 mg | INTRAMUSCULAR | Status: DC | PRN

## 2012-04-23 MED ORDER — SCOPOLAMINE 1 MG/3DAYS TD PT72
1.0000 | MEDICATED_PATCH | Freq: Once | TRANSDERMAL | Status: DC
  Administered 2012-04-23: 1.5 mg via TRANSDERMAL

## 2012-04-23 MED ORDER — OXYTOCIN 10 UNIT/ML IJ SOLN
INTRAMUSCULAR | Status: AC
  Filled 2012-04-23: qty 4

## 2012-04-23 MED ORDER — ONDANSETRON HCL 4 MG/2ML IJ SOLN: 4.0000 mg | INTRAMUSCULAR | Status: DC | PRN

## 2012-04-23 MED ORDER — ONDANSETRON HCL 4 MG/2ML IJ SOLN
INTRAMUSCULAR | Status: DC | PRN
  Administered 2012-04-23: 4 mg via INTRAVENOUS

## 2012-04-23 MED ORDER — ONDANSETRON HCL 4 MG/2ML IJ SOLN: 4.0000 mg | Freq: Three times a day (TID) | INTRAMUSCULAR | Status: DC | PRN

## 2012-04-23 MED ORDER — DIPHENHYDRAMINE HCL 25 MG PO CAPS
25.0000 mg | ORAL_CAPSULE | ORAL | Status: DC | PRN
  Filled 2012-04-23: qty 1

## 2012-04-23 MED ORDER — SENNOSIDES-DOCUSATE SODIUM 8.6-50 MG PO TABS
2.0000 | ORAL_TABLET | Freq: Every day | ORAL | Status: DC
  Administered 2012-04-23 – 2012-04-25 (×3): 2 via ORAL

## 2012-04-23 MED ORDER — 0.9 % SODIUM CHLORIDE (POUR BTL) OPTIME
TOPICAL | Status: DC | PRN
  Administered 2012-04-23: 1000 mL

## 2012-04-23 MED ORDER — ONDANSETRON HCL 4 MG/2ML IJ SOLN
INTRAMUSCULAR | Status: AC
  Filled 2012-04-23: qty 2

## 2012-04-23 MED ORDER — FENTANYL CITRATE 0.05 MG/ML IJ SOLN
INTRAMUSCULAR | Status: DC | PRN
  Administered 2012-04-23: 25 ug via INTRAVENOUS

## 2012-04-23 MED ORDER — FENTANYL CITRATE 0.05 MG/ML IJ SOLN
INTRAMUSCULAR | Status: AC
  Filled 2012-04-23: qty 2

## 2012-04-23 MED ORDER — OXYCODONE-ACETAMINOPHEN 5-325 MG PO TABS
1.0000 | ORAL_TABLET | ORAL | Status: DC | PRN
  Administered 2012-04-24 – 2012-04-26 (×12): 2 via ORAL
  Filled 2012-04-23 (×12): qty 2

## 2012-04-23 MED ORDER — KETOROLAC TROMETHAMINE 30 MG/ML IJ SOLN
30.0000 mg | Freq: Four times a day (QID) | INTRAMUSCULAR | Status: AC | PRN
  Administered 2012-04-23: 30 mg via INTRAVENOUS
  Filled 2012-04-23: qty 1

## 2012-04-23 MED ORDER — HEMOSTATIC AGENTS (NO CHARGE) OPTIME
TOPICAL | Status: DC | PRN
  Administered 2012-04-23: 1 via TOPICAL

## 2012-04-23 MED ORDER — LACTATED RINGERS IV SOLN
INTRAVENOUS | Status: DC
  Administered 2012-04-23 (×2): via INTRAVENOUS

## 2012-04-23 MED ORDER — SODIUM CHLORIDE 0.9 % IJ SOLN: 3.0000 mL | INTRAMUSCULAR | Status: DC | PRN

## 2012-04-23 MED ORDER — KETOROLAC TROMETHAMINE 60 MG/2ML IM SOLN
INTRAMUSCULAR | Status: AC
  Administered 2012-04-23: 60 mg via INTRAMUSCULAR
  Filled 2012-04-23: qty 2

## 2012-04-23 MED ORDER — ONDANSETRON HCL 4 MG PO TABS: 4.0000 mg | ORAL_TABLET | ORAL | Status: DC | PRN

## 2012-04-23 MED ORDER — IBUPROFEN 600 MG PO TABS
600.0000 mg | ORAL_TABLET | Freq: Four times a day (QID) | ORAL | Status: DC | PRN
  Filled 2012-04-23 (×7): qty 1

## 2012-04-23 SURGICAL SUPPLY — 40 items
BENZOIN TINCTURE PRP APPL 2/3 (GAUZE/BANDAGES/DRESSINGS) ×3 IMPLANT
CHLORAPREP W/TINT 26ML (MISCELLANEOUS) ×3 IMPLANT
CLOTH BEACON ORANGE TIMEOUT ST (SAFETY) ×3 IMPLANT
CONTAINER PREFILL 10% NBF 15ML (MISCELLANEOUS) IMPLANT
DRESSING TELFA 8X3 (GAUZE/BANDAGES/DRESSINGS) ×3 IMPLANT
DRSG COVADERM 4X10 (GAUZE/BANDAGES/DRESSINGS) IMPLANT
ELECT REM PT RETURN 9FT ADLT (ELECTROSURGICAL) ×3
ELECTRODE REM PT RTRN 9FT ADLT (ELECTROSURGICAL) ×2 IMPLANT
EXTRACTOR VACUUM M CUP 4 TUBE (SUCTIONS) IMPLANT
GAUZE SPONGE 4X4 12PLY STRL LF (GAUZE/BANDAGES/DRESSINGS) ×3 IMPLANT
GLOVE BIO SURGEON STRL SZ7.5 (GLOVE) ×9 IMPLANT
GLOVE BIOGEL PI IND STRL 7.5 (GLOVE) ×4 IMPLANT
GLOVE BIOGEL PI INDICATOR 7.5 (GLOVE) ×2
GOWN PREVENTION PLUS LG XLONG (DISPOSABLE) ×9 IMPLANT
HEMOSTAT SURGICEL 2X14 (HEMOSTASIS) ×3 IMPLANT
KIT ABG SYR 3ML LUER SLIP (SYRINGE) IMPLANT
NEEDLE HYPO 22GX1.5 SAFETY (NEEDLE) IMPLANT
NEEDLE HYPO 25X5/8 SAFETYGLIDE (NEEDLE) IMPLANT
NS IRRIG 1000ML POUR BTL (IV SOLUTION) ×3 IMPLANT
PACK C SECTION WH (CUSTOM PROCEDURE TRAY) ×3 IMPLANT
PAD ABD 7.5X8 STRL (GAUZE/BANDAGES/DRESSINGS) ×3 IMPLANT
PAD OB MATERNITY 4.3X12.25 (PERSONAL CARE ITEMS) IMPLANT
RETRACTOR WND ALEXIS 25 LRG (MISCELLANEOUS) ×2 IMPLANT
RTRCTR WOUND ALEXIS 25CM LRG (MISCELLANEOUS) ×3
SLEEVE SCD COMPRESS KNEE MED (MISCELLANEOUS) IMPLANT
STRIP CLOSURE SKIN 1/2X4 (GAUZE/BANDAGES/DRESSINGS) ×3 IMPLANT
SUT CHROMIC 2 0 CT 1 (SUTURE) ×3 IMPLANT
SUT MNCRL AB 3-0 PS2 27 (SUTURE) ×3 IMPLANT
SUT PLAIN 0 NONE (SUTURE) ×3 IMPLANT
SUT PLAIN 2 0 XLH (SUTURE) ×3 IMPLANT
SUT VIC AB 0 CT1 36 (SUTURE) ×3 IMPLANT
SUT VIC AB 0 CTX 36 (SUTURE) ×4
SUT VIC AB 0 CTX36XBRD ANBCTRL (SUTURE) ×8 IMPLANT
SUT VIC AB 2-0 SH 27 (SUTURE) ×3
SUT VIC AB 2-0 SH 27XBRD (SUTURE) ×6 IMPLANT
SYR CONTROL 10ML LL (SYRINGE) IMPLANT
TAPE CLOTH SURG 4X10 WHT LF (GAUZE/BANDAGES/DRESSINGS) ×3 IMPLANT
TOWEL OR 17X24 6PK STRL BLUE (TOWEL DISPOSABLE) ×6 IMPLANT
TRAY FOLEY CATH 14FR (SET/KITS/TRAYS/PACK) ×3 IMPLANT
WATER STERILE IRR 1000ML POUR (IV SOLUTION) ×3 IMPLANT

## 2012-04-23 NOTE — Preoperative (Signed)
Beta Blockers   Reason not to administer Beta Blockers:Not Applicable 

## 2012-04-23 NOTE — Anesthesia Procedure Notes (Signed)

## 2012-04-23 NOTE — Anesthesia Preprocedure Evaluation (Signed)

## 2012-04-23 NOTE — H&P (Signed)
Tina [name] is a 31 y.o. female presenting for scheduled repeat c/s at [redacted] weeks gestation for IUGR and Rh alloimmunization.  No c/o's today.  GFM.  Denies LOF, VB, UTI or PIH s/s.  No recent illness or fever.     Prenatal Course: Pt late to care and poor historian r/e first presentation for care and where at.  First note is by Dr. Sherrie George at MFM at [redacted]w[redacted]d.  Pt then followed thereafter by [facility] in collaboration w/ [facility]FM.  Pt had anatomy scan at [redacted]w[redacted]d w/ Dr. Claudean Severance; WNL except AC lag; EFW=24%.  Pt has had antenatal testing along w/ serial growth u/s.  IUGR was noted at [redacted]w[redacted]d u/s and delivery rec'd by MFM for 37 weeks.  Pt's last Antibody titer was drawn on 02/04/12 and titer=32.  Pt was late to care, so aneuploidy screens outside of window to be drawn.  Pt also doesn't recall doing DM screen this pregnancy.  OB Hx:  (pt denies today any abortions or miscarriages) G1=c/s '05 for FTP at 40 weeks; M=7+5 G2=c/s '08; repeat at 38 weeks; M=6+13 (GDM) G3=c/s '10; repeat at [redacted]w[redacted]d; F=6+5 (GDM) G4=c/s '12; repeat at 39 weeks; M=7lb  G5=current  Maternal Medical History:  Fetal activity: Perceived fetal activity is normal.   Last perceived fetal movement was within the past hour.    Prenatal complications: IUGR and substance abuse.   1.  Rh alloimmunization 2.  A neg 3.  IUGR since 35 weeks w/ AC lag noted since [redacted]w[redacted]d 4.  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  Smoker 6.  Previous c/s x4 7.  Late to care    OB History    Grav Para Term Preterm Abortions TAB SAB Ect Mult Living   5 3 3  0 1 0 1 0 0 3     Past Medical History  Diagnosis Date  . Gonorrhea   . Trichimoniasis   . Hx of chlamydia infection   . H/O bacterial infection   . H/O varicella   . Headache     Frequent  . Acid reflux   . Postpartum depression   . Depression     NO MEDS  . Abnormal Pap smear     2003 ASC-US 2005 ASC-US LSIL high risk HPV;LAST PAP A WHILE AGO  . 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 H/O migraine   .  Gestational diabetes 2008    NO MEDS;DIET CONTROLLED  . Gestational diabetes mellitus 2012    NO MEDS; DIET CONTROLLED   Past Surgical History  Procedure Date  . Cesarean section 2005    x4;FIRST CHILD IN FETAL DISTRESS   Family History: family history includes Asthma in her brother; COPD in her brother and mother; Hypertension in her paternal grandfather and paternal grandmother; Other in her paternal aunt and paternal grandmother; and Seizures in her cousin.  There is no history of Anesthesia problems, and Hypotension, and Malignant hyperthermia, and Pseudochol deficiency, . Social History:  reports that she has been smoking Cigarettes.  She has been smoking about .25 packs per day. She has never used smokeless tobacco. She reports that she does not drink alcohol or use illicit drugs.   Prenatal Transfer Tool  Maternal Diabetes: No Genetic Screening: Declined Maternal Ultrasounds/Referrals: Abnormal:  Findings:   IUGR, Other: Fetal Ultrasounds or other Referrals:  Referred to Materal Fetal Medicine  Maternal Substance Abuse:  Yes:  Type: Smoker, Marijuana Significant Maternal Medications:  None Significant Maternal Lab Results:  Lab values include: Group  B Strep negative, Rh negative, Other:  Other Comments:  Rh alloimmunization; Late to care at 27 weeks; pt does not recall doing 1hr gtt this pregnancy  Review of Systems  Constitutional: Negative.   HENT: Positive for congestion.   Eyes: Negative.   Respiratory: Negative.   Cardiovascular: Negative.   Gastrointestinal: Negative.   Genitourinary: Negative.   Skin: Negative.   Neurological: Negative.       Blood pressure 117/82, pulse 96, temperature 98.2 F (36.8 C), temperature source Oral, resp. rate 18, last menstrual period 08/08/2011, SpO2 98.00%, not currently breastfeeding. Maternal Exam:  Uterine Assessment: Contraction frequency is rare.   Abdomen: Patient reports no abdominal tenderness. Surgical scars: low  transverse.   Introitus: not evaluated.     Physical Exam  Constitutional: She is oriented to person, place, and time. She appears well-developed and well-nourished. No distress.  HENT:  Head: Normocephalic and atraumatic.       Poor dentition  Eyes: Pupils are equal, round, and reactive to light.  Cardiovascular: Normal rate and regular rhythm.   Respiratory: Effort normal and breath sounds normal.  GI: Soft.       gravid  Genitourinary:       Pelvic deferred  Neurological: She is alert and oriented to person, place, and time. She has normal reflexes.  Skin: Skin is warm and dry.  Psychiatric: Her behavior is normal. Thought content normal.    Prenatal labs: ABO, Rh: --/--/A NEG (08/29 1500) Antibody: POS (08/29 1500) Rubella: 68.0 (06/12 1154) RPR: NON REACTIVE (08/29 1500)  HBsAg: NEGATIVE (06/12 1154)  HIV: NON REACTIVE (06/12 1154)  ZOX:[id number]     Assessment/Plan: 1.  37 weeks 2.  IUGR 3.  Rh alloimmunization 4.  Previous c/s x4 5.  Rh neg 6.  Declines BTL  1.  Admit to [facility] w/ Dr. Su Hilt as attending w/ routine pre-op orders 2.  Delivery at 37 weeks rec'd by MFM secondary to IUGR and Rh alloimmunization; repeat c/s rec'd secondary to h/o c/s x4.  Pt verbalized understanding of recommendations, and agreeable to proceed w/ delivery via repeat c/s.  R/b/a disc'd, which include, but are not limited to, infection, bleeding, anesthesia complications, and damage to surrounding organs.  Pt verbalizes understanding. 3.  MD to follow   STEELMAN,CANDICE H 04/23/2012, 1:40 PM  Agree with above.  Pt plans Mirena PP. - AYR

## 2012-04-23 NOTE — Consult Note (Signed)
Neonatology Note:  Attendance at C-section:  I was asked to attend this repeat C/S at 37 weeks. The mother is a G5P3A1 who was late to PNC. She is A neg (anti-D antibody positive); the baby has been followed by MFM with ultrasound and normal except for slight growth lag. Delivery was recommended at 37 weeks. GBS neg smoker with a history of THC use. She has a history of GDM with 2 of her previous pregnancies. ROM at delivery, fluid clear. Infant vigorous with good spontaneous cry and tone. Needed only minimal bulb suctioning. Ap 9/9. Lungs clear to ausc in DR. To CN to care of Pediatrician.  Loralei Radcliffe, MD  

## 2012-04-23 NOTE — Op Note (Signed)
Cesarean Section Procedure Note  Indications: 1.37wks 2.Rh isoimmunization 3.IUGR  Pre-operative Diagnosis: Prior Cesarean; Bilateral Tubal Ligation   Post-operative Diagnosis: previous ceserean section, desires sterilization  Procedure: CESAREAN SECTION WITH BILATERAL TUBAL LIGATION  Surgeon: Purcell Nails, MD    Assistants: scrub tech Carollee Herter)  Anesthesia: Spinal  Anesthesiologist: Jiles Garter, MD   Procedure Details  The patient was taken to the operating room secondary to repeat csection after the risks, benefits, complications, treatment options, and expected outcomes were discussed with the patient.  The patient concurred with the proposed plan, giving informed consent which was signed and witnessed. The patient was taken to Operating Room 1, identified as Tina [name] and the procedure verified as C-Section Delivery. A Time Out was held and the above information confirmed.  After induction of anesthesia by obtaining a spinal, the patient was prepped and draped in the usual sterile manner. A Pfannenstiel skin incision was made and carried down through the subcutaneous tissue to the underlying layer of fascia.  The fascia was incised bilaterally and extended transversely bilaterally with the Mayo scissors. Kocher clamps were placed on the inferior aspect of the fascial incision and the underlying rectus muscle was separated from the fascia. The same was done on the superior aspect of the fascial incision.  The peritoneum was identified, entered bluntly and extended manually. The utero-vesical peritoneal reflection was incised transversely and the bladder flap was bluntly [name] from the lower uterine segment. A low transverse uterine incision was made with the scalpel and extended bilaterally with the bandage scissors.  The infant was delivered in vertex position without difficulty.  After the umbilical cord was clamped and cut, the infant was handed to the awaiting  pediatricians.  Cord blood was obtained for evaluation.  The placenta was removed intact and appeared to be within normal limits. The uterus was cleared of all clots and debris. The uterine incision was closed with running interlocking sutures of 0 Vicryl and a second imbricating layer was performed as well.   Bilateral tubes and ovaries appeared to be within normal limits.  Small amount of oozing noted along uterine incision and several stitches of 2-0 and 0 vicryl used.  Very minimal oozing remained, tissue was extremely friable and kept tearing with tieing sutures.  Surgicel was applied and good hemostasis noted. Copious irrigation was performed until clear.  The peritoneum was repaired with 2-0 chromic via a running suture.  The fascia was reapproximated with a running suture of 0 Vicryl. The subcutaneous tissue was reapproximated with 3 interrupted sutures of 2-0 plain.  The skin was reapproximated with a subcuticular suture of 3-0 monocryl.  Steristrips were applied.  Instrument, sponge, and needle counts were correct prior to abdominal closure and at the conclusion of the case.  The patient was awaiting transfer to the recovery room in good condition.  Findings: Live female infant with Apgars 9 at one minute and 9 at five minutes.  Normal appearing bilateral ovaries and fallopian tubes were noted.  Estimated Blood Loss:          Drains: foley to gravity 400cc         Total IV Fluids:         Specimens to Pathology: Placenta         Complications:  None; patient tolerated the procedure well.         Disposition: PACU - hemodynamically stable.         Condition: stable  Attending Attestation:  I performed the procedure.

## 2012-04-23 NOTE — Anesthesia Postprocedure Evaluation (Signed)
  Anesthesia Post-op Note  Patient: Tina [name]  Procedure(s) Performed: Procedure(s) (LRB): CESAREAN SECTION (N/A)  Patient is awake, responsive, moving her legs, and has signs of resolution of her numbness. Pain and nausea are reasonably well controlled. Vital signs are stable and clinically acceptable. Oxygen saturation is clinically acceptable. There are no apparent anesthetic complications at this time. Patient is ready for discharge.

## 2012-04-23 NOTE — Transfer of Care (Signed)
Immediate Anesthesia Transfer of Care Note  Patient: Tina [name]  Procedure(s) Performed: Procedure(s) (LRB): CESAREAN SECTION (N/A)  Patient Location: PACU  Anesthesia Type: Spinal  Level of Consciousness: awake, alert  and oriented  Airway & Oxygen Therapy: Patient Spontanous Breathing  Post-op Assessment: Report given to PACU RN  Post vital signs: Reviewed and stable  Complications: No apparent anesthesia complications

## 2012-04-24 LAB — CBC
HCT: 28.2 % — ABNORMAL LOW (ref 36.0–46.0)
Hemoglobin: 9.5 g/dL — ABNORMAL LOW (ref 12.0–15.0)
MCHC: 33.7 g/dL (ref 30.0–36.0)
MCV: 91.3 fL (ref 78.0–100.0)
RDW: 13.4 % (ref 11.5–15.5)

## 2012-04-24 LAB — TYPE AND SCREEN
ABO/RH(D): A NEG
Antibody Screen: POSITIVE
DAT, IgG: NEGATIVE

## 2012-04-24 MED ORDER — SCOPOLAMINE 1 MG/3DAYS TD PT72
1.0000 | MEDICATED_PATCH | Freq: Once | TRANSDERMAL | Status: DC
  Filled 2012-04-24: qty 1

## 2012-04-24 MED ORDER — LACTATED RINGERS IV SOLN: INTRAVENOUS | Status: DC

## 2012-04-24 NOTE — Plan of Care (Signed)
Problem: Phase II Progression Outcomes Goal: Rh isoimmunization per orders Outcome: Not Met (add Reason) MD aware pt is not a candidate because she is anti D positive since 2008

## 2012-04-24 NOTE — Anesthesia Postprocedure Evaluation (Signed)
  Anesthesia Post-op Note  Patient: Tina [name]  Procedure(s) Performed: Procedure(s) (LRB): CESAREAN SECTION (N/A)  Patient Location: Mother/Baby  Anesthesia Type: Spinal  Level of Consciousness: awake  Airway and Oxygen Therapy: Patient Spontanous Breathing  Post-op Pain: mild  Post-op Assessment: Patient's Cardiovascular Status Stable and Respiratory Function Stable  Post-op Vital Signs: stable  Complications: No apparent anesthesia complications

## 2012-04-24 NOTE — Addendum Note (Signed)
Addendum  created 04/24/12 1610 by Renford Dills, CRNA   Modules edited:Notes Section

## 2012-04-24 NOTE — Progress Notes (Addendum)
Patient ID: Tina [name], female   DOB: 12-03-1980, 31 y.o.   MRN: [id number] Subjective: Postpartum Day 1: Cesarean Delivery secondary to: repeat  Patient reports tolerating PO, + flatus and no problems voiding.   no complaints, up ad lib without syncope Pain well controlled with po meds Initially BF, but states "baby likes the bottle"  Mood stable, bonding well Contraception: initially wanted BTL but declined, plans on mirena   Objective: Vital signs in last 24 hours: Temp:  [97.3 F (36.3 C)-98.3 F (36.8 C)] 97.3 F (36.3 C) (08/31 2109) Pulse Rate:  [68-87] 87  (08/31 2109) Resp:  [16-20] 18  (08/31 2109) BP: (102-129)/(60-78) 106/60 mmHg (08/31 2109) SpO2:  [99 %-100 %] 99 % (08/31 1444)  Physical Exam:  General: alert and no distress Breasts: soft Heart: RRR Lungs: CTAB Abdomen: BS x4 Uterine Fundus: firm Incision: dressing CDI, sm area of scant drainage noted on R side marked w a pen, but borders have not changed  Lochia: appropriate DVT Evaluation: No evidence of DVT seen on physical exam. Negative Homan's sign. No significant calf/ankle edema.   Basename 04/24/12 0510 04/22/12 1500  HGB 9.5* 11.1*  HCT 28.2* 33.1*    Assessment/Plan: Status post Cesarean section. Doing well postoperatively.  Continue current care. Mild anemia - hemodynamically stable  Pt is A neg, antibody screen was pos, pt did not receive rhogam  Pt has a hx +THC, SW consult ordered   Grover Woodfield M 04/24/2012, 10:09 PM (late entry, pt seen at 11am on 8-31)

## 2012-04-24 NOTE — Plan of Care (Signed)
Problem: Phase II Progression Outcomes Goal: Rh isoimmunization per orders Outcome: Not Applicable Date Met:  04/24/12 Mother is already antibody positive and lab stated RHOgam would do her no goog at this point so she is not indicated.

## 2012-04-24 NOTE — Progress Notes (Signed)
CSW received consult.  Awaiting infant UDS and will complete full assessment.

## 2012-04-25 MED ORDER — SENNOSIDES-DOCUSATE SODIUM 8.6-50 MG PO TABS: 2.0000 | ORAL_TABLET | Freq: Every day | ORAL | Status: AC

## 2012-04-25 MED ORDER — FERROUS SULFATE 325 (65 FE) MG PO TABS: 325.0000 mg | ORAL_TABLET | Freq: Every day | ORAL | Status: AC

## 2012-04-25 MED ORDER — IBUPROFEN 600 MG PO TABS: 600.0000 mg | ORAL_TABLET | Freq: Four times a day (QID) | ORAL | Status: DC | PRN

## 2012-04-25 MED ORDER — OXYCODONE-ACETAMINOPHEN 5-325 MG PO TABS: 1.0000 | ORAL_TABLET | ORAL | Status: AC | PRN

## 2012-04-25 NOTE — Progress Notes (Signed)
First urine collected at 1445 was mislabeled by RN on day shift. Pt instructed to collect urine again and call RN for pickup at 1550pm today. At 2330 RN asked pt if she had collected urine and she stated RN on day shift had not given her blue container to collect. At 2330 I gave her another container to collect and told her to call night RN to pickup. Meanwhile the lab had cancelled the original order for UDS for mom, and I had to put another order in.

## 2012-04-25 NOTE — Plan of Care (Signed)
Problem: Discharge Progression Outcomes Goal: Complications resolved/controlled Outcome: Progressing Encouraging mother to feed baby q3hrs 30-38ml.

## 2012-04-25 NOTE — Progress Notes (Signed)
Subjective: Postpartum Day 2 Cesarean Delivery Patient reports tolerating PO, + flatus, + BM and no problems voiding.    Objective:  Hemoglobin & Hematocrit     Component Value Date/Time   HGB 9.5* 04/24/2012 0510   HCT 28.2* 04/24/2012 0510    Vital signs in last 24 hours:  Physical Exam:  General: alert, cooperative and no distress Lochia: appropriate Uterine Fundus: firm Incision: well approximated no redness, edema, or drainage, with steri strips DVT Evaluation: Negative Homan's sign. No significant calf/ankle edema. Lungs clear bilaterally AP RRR Bowel sounds active abd soft, nontypmanic  S: comfortable, little bleeding, slept     Feeding     -Homan's sign bilaterally       Edema lower extrermities A normal involution     nonactating     PO day 2     Normal involution P discussed +THC 2012, no drug screen this pg so will do today, has collected void and stool on baby, plans Mirena IUD, continue care Tina [name], CNM

## 2012-04-25 NOTE — Clinical Social Work Note (Signed)
Clinical Social Work Department PSYCHOSOCIAL ASSESSMENT - MATERNAL/CHILD 04/25/2012  Patient:  Tina [name],Tina [name]  Account Number:  [id number]  Admit Date:  04/23/2012  Childs Name:   Khaliyah Kimm    Clinical Social Worker:  Gautham Hewins, LCSW   Date/Time:  04/25/2012 10:45 AM  Date Referred:  04/25/2012   Referral source  Physician     Referred reason  Substance Abuse   Other referral source:    I:  FAMILY / HOME ENVIRONMENT Child's legal guardian:  PARENT  Guardian - Name Guardian - Age Guardian - Address  Tina [name] 31 [address] [place], [place] [address]  Tina [name]  [address] [place], [place] [address]   Other household support members/support persons Name Relationship DOB  8 year old SISTER   5 year old BROTHER   3 year old SISTER   1 year old BROTHER    Other support:   MOB reports lots of family support (Aunt Jackie, Aunt Tracey, lots of cousins.    II  PSYCHOSOCIAL DATA Information Source:  Patient Interview  Financial and Community Resources Employment:   Financial resources:  Medicaid If Medicaid - County:  [place] Other  WIC  Food Stamps   School / Grade:   Maternity Care Coordinator / Child Services Coordination / Early Interventions:  Cultural issues impacting care:    III  STRENGTHS Strengths  Adequate Resources  Home prepared for Child (including basic supplies)  Compliance with medical plan  Supportive family/friends   Strength comment:    IV  RISK FACTORS AND CURRENT PROBLEMS Current Problem:  None   Risk Factor & Current Problem Patient Issue Family Issue Risk Factor / Current Problem Comment   N N     V  SOCIAL WORK ASSESSMENT CSW spoke with MOB in room.  MOB currently lives with FOB who is her fiance and 4 other children.  Discussed hx of drug use, MOB reports this is a hx and she has not used recently.  UDS neg.  Discussed drug screen and possible follow up with [facility] if results are postive.  Discussed emotion  and any emtional concerns.  MOB reports none at this time however will let RN or CSW know if any arise. MOB reports lots of family support.  MOB was concerns about some supplies, so CSW provided bundle pack to MOB.  No other concerns at this time for discharge.      VI SOCIAL WORK PLAN Social Work Plan  No Further Intervention Required / No Barriers to Discharge   Type of pt/family education:   If child protective services report - county:   If child protective services report - date:   Information/referral to community resources comment:   Other social work plan:    

## 2012-04-26 ENCOUNTER — Encounter (HOSPITAL_COMMUNITY): Payer: Self-pay | Admitting: Obstetrics and Gynecology

## 2012-04-26 LAB — RAPID URINE DRUG SCREEN, HOSP PERFORMED
Amphetamines: NOT DETECTED
Cocaine: NOT DETECTED
Opiates: NOT DETECTED
Tetrahydrocannabinol: NOT DETECTED

## 2012-04-26 NOTE — Progress Notes (Signed)
UR chart review completed.  

## 2012-04-26 NOTE — Discharge Summary (Signed)
Physician Discharge Summary  Patient ID: Tina [name] MRN: [id number] DOB/AGE: August 01, 1981 31 y.o.  Admit date: 04/23/2012 Discharge date: 04/26/2012  Admission Diagnoses:  [redacted]w[redacted]d Prenatal complications: IUGR and substance abuse. Neg UDS this visit hx of positive THC 2012 1. Rh alloimmunization  2. A neg  3. IUGR since 35 weeks w/ AC lag noted since [redacted]w[redacted]d  4. MJ use in pregnancy  5. Smoker  6. Previous c/s x4  7. Late to care  Discharge Diagnoses:  Principal Problem:  *Status post cesarean delivery Active Problems:  Cannabis abuse  Smoker  IUGR (intrauterine growth retardation), delivered, current hospitalization  Rh alloimmunization, maternal, antepartum anemia  Discharged Condition: stable  Hospital Course: [redacted]w[redacted]d, repeat C/S x 5, normal involution, bottle feeding, plans mirena IUD.   Consults: None  Significant Diagnostic Studies: labs:  Hemoglobin & Hematocrit     Component Value Date/Time   HGB 9.5* 04/24/2012 0510   HCT 28.2* 04/24/2012 0510     Treatments: IV hydration  Discharge Exam: Blood pressure 114/76, pulse 82, temperature 97.9 F (36.6 C), temperature source Oral, resp. rate 18, weight 154 lb (69.854 kg), last menstrual period 08/08/2011, SpO2 99.00%, unknown if currently breastfeeding. Subjective: Postpartum Day 3 Cesarean Delivery Patient reports tolerating PO, + flatus, + BM and no problems voiding comfortable with percocet and motrin. Agrees to enema prior to discharge Vital signs in last 24 hours:  Results for orders placed during the hospital encounter of 04/23/12 (from the past 48 hour(s))  URINE RAPID DRUG SCREEN (HOSP PERFORMED)     Status: Normal   Collection Time   04/25/12 11:58 PM      Component Value Range Comment   Opiates NONE DETECTED  NONE DETECTED    Cocaine NONE DETECTED  NONE DETECTED    Benzodiazepines NONE DETECTED  NONE DETECTED    Amphetamines NONE DETECTED  NONE DETECTED    Tetrahydrocannabinol NONE DETECTED  NONE  DETECTED    Barbiturates NONE DETECTED  NONE DETECTED     Physical Exam:  General: alert, cooperative and no distress Lochia: appropriate Uterine Fundus: firm Incision:  DVT Evaluation: Negative Homan's sign. No significant calf/ankle edema. Lungs clear bilaterally AP RRR Bowel sounds active abd nt,  S: comfortable, little bleeding, slept     Feeding     -Homan's sign bilaterally  Disposition: 01-Home or Self Care   Medication List  As of 04/26/2012  8:23 AM   TAKE these medications         ferrous sulfate 325 (65 FE) MG tablet   Take 1 tablet (325 mg total) by mouth daily.      ibuprofen 600 MG tablet   Commonly known as: ADVIL,MOTRIN   Take 1 tablet (600 mg total) by mouth every 6 (six) hours as needed.      oxyCODONE-acetaminophen 5-325 MG per tablet   Commonly known as: PERCOCET/ROXICET   Take 1-2 tablets by mouth every 4 (four) hours as needed (moderate - severe pain).      senna-docusate 8.6-50 MG per tablet   Commonly known as: Senokot-S   Take 2 tablets by mouth at bedtime.           Follow-up Information    Follow up with [facility] in 6 weeks.       [facility] handbook  SignedLavera Guise 04/26/2012, 8:23 AM

## 2012-04-27 ENCOUNTER — Telehealth: Payer: Self-pay | Admitting: Obstetrics and Gynecology

## 2012-04-27 ENCOUNTER — Inpatient Hospital Stay (HOSPITAL_COMMUNITY)
Admission: AD | Admit: 2012-04-27 | Discharge: 2012-04-27 | Disposition: A | Discharge status: Home / Self Care | Payer: Medicaid Other | Source: Ambulatory Visit | Attending: Obstetrics and Gynecology | Admitting: Obstetrics and Gynecology

## 2012-04-27 ENCOUNTER — Encounter (HOSPITAL_COMMUNITY): Payer: Self-pay | Admitting: *Deleted

## 2012-04-27 DIAGNOSIS — O99893 Other specified diseases and conditions complicating puerperium: Secondary | ICD-10-CM | POA: Insufficient documentation

## 2012-04-27 DIAGNOSIS — R109 Unspecified abdominal pain: Secondary | ICD-10-CM

## 2012-04-27 DIAGNOSIS — Z9889 Other specified postprocedural states: Secondary | ICD-10-CM

## 2012-04-27 LAB — CBC
MCH: 29.7 pg (ref 26.0–34.0)
MCHC: 32.6 g/dL (ref 30.0–36.0)
Platelets: 146 10*3/uL — ABNORMAL LOW (ref 150–400)

## 2012-04-27 MED ORDER — LACTATED RINGERS IV BOLUS (SEPSIS)
500.0000 mL | Freq: Once | INTRAVENOUS | Status: AC
  Administered 2012-04-27: 500 mL via INTRAVENOUS

## 2012-04-27 MED ORDER — KETOROLAC TROMETHAMINE 30 MG/ML IJ SOLN
30.0000 mg | Freq: Once | INTRAMUSCULAR | Status: AC
  Administered 2012-04-27: 30 mg via INTRAVENOUS
  Filled 2012-04-27: qty 1

## 2012-04-27 MED ORDER — ONDANSETRON HCL 4 MG/2ML IJ SOLN
4.0000 mg | Freq: Four times a day (QID) | INTRAMUSCULAR | Status: DC | PRN
  Administered 2012-04-27: 4 mg via INTRAVENOUS
  Filled 2012-04-27: qty 2

## 2012-04-27 MED ORDER — OXYCODONE-ACETAMINOPHEN 10-325 MG PO TABS: 1.0000 | ORAL_TABLET | Freq: Four times a day (QID) | ORAL | Status: AC | PRN

## 2012-04-27 MED ORDER — IBUPROFEN 600 MG PO TABS: 600.0000 mg | ORAL_TABLET | Freq: Four times a day (QID) | ORAL | Status: AC | PRN

## 2012-04-27 NOTE — MAU Provider Note (Signed)
History     CSN: [id number]  Arrival date and time: 04/27/12 1243   First Provider Initiated Contact with Patient 04/27/12 1339      Chief Complaint  Patient presents with  . Postpartum Complications    Patient is complaining of  lower abdominal pain s/p RLTCS 04/23/12. had been d/c home on Sunday. States that "Her meds fell down the toilet this morning" In pain.   HPI  OB History    Grav Para Term Preterm Abortions TAB SAB Ect Mult Living   5 4 4 0 1 0 1 0 0 4      Past Medical History  Diagnosis Date  . Gonorrhea   . Trichimoniasis   . Hx of chlamydia infection   . H/O bacterial infection   . H/O varicella   . Headache     Frequent  . Acid reflux   . Postpartum depression   . Depression     NO MEDS  . Abnormal Pap smear     20 03 ASC-US 2005 ASC-US LSIL high risk HPV;LAST PAP A WHILE AGO  . 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 H/O migraine   . Gestational diabetes 2008    NO MEDS;DIET CONTROLLED  . Gestational diabetes mellitus 2012    NO MEDS; DIET CONTROLLED  . 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 Status post cesarean delivery 04/23/2012    Past Surgical History  Procedure Date  . Cesarean section 2005    x4;FIRST CHILD IN FETAL DISTRESS  . Cesarean section 04/23/2012    Procedure: CESAREAN SECTION;  Surgeon: Purcell Nails, MD;  Location: [facility];  Service: Obstetrics;  Laterality: N/A;    Family History  Problem Relation Age of Onset  . Hypertension Paternal Grandfather   . Anesthesia problems Neg Hx   . Hypotension Neg Hx   . Malignant hyperthermia Neg Hx   . Pseudochol deficiency Neg Hx   . COPD Mother     Bronchitis  . Asthma Brother   . COPD Brother     Bronchitis  . Hypertension Paternal Grandmother   . Other Paternal Grandmother     SPIDER VEINS  . Other Paternal Aunt     SPIDER VEINS  . Seizures Cousin     PATERNAL    History  Substance Use Topics  . Smoking status: Current Everyday Smoker -- 0.2  packs/day    Types: Cigarettes  . Smokeless tobacco: Never Used  . Alcohol Use: No    Allergies: No Known Allergies  Prescriptions prior to admission  Medication Sig Dispense Refill  . ferrous sulfate 325 (65 FE) MG tablet Take 1 tablet (325 mg total) by mouth daily.  30 tablet  1  . ibuprofen (ADVIL,MOTRIN) 600 MG tablet Take 1 tablet (600 mg total) by mouth every 6 (six) hours as needed.  30 tablet  2  . oxyCODONE-acetaminophen (PERCOCET/ROXICET) 5-325 MG per tablet Take 1-2 tablets by mouth every 4 (four) hours as needed (moderate - severe pain).  30 tablet  0  . senna-docusate (SENOKOT-S) 8.6-50 MG per tablet Take 2 tablets by mouth at bedtime.  60 tablet  0    Review of Systems  Constitutional: Negative.   HENT: Negative.   Eyes: Negative.   Respiratory: Negative.   Cardiovascular: Negative.   Gastrointestinal: Negative.   Genitourinary: Negative.   Musculoskeletal: Negative.   Skin: Negative.   Neurological: Negative.   Endo/Heme/Allergies: Negative.   Psychiatric/Behavioral: Positive for substance abuse.  Hx of THC use and other substances in the past   Physical Exam   Blood pressure 129/94, pulse 100, temperature 98.4 F (36.9 C), temperature source Oral, resp. rate 18, height 5\' 4"  (1.626 m), weight 148 lb 8 oz (67.359 kg), last menstrual period 04/23/2012, SpO2 99.00%, not currently breastfeeding.  Physical Exam  Constitutional: She appears well-developed.  Neck: Normal range of motion. Neck supple.  Cardiovascular: Normal rate, regular rhythm and normal heart sounds.   GI: Soft. Bowel sounds are normal.       Patient abdomen soft and BS Normoactive x 3 Incision site D&I  Genitourinary:       S/p RLTCS 04/23/12 and D/c to home Sunday.  Skin: Skin is warm and dry.  Psychiatric: She has a normal mood and affect. Her behavior is normal.       Crying in pain    MAU Course  Procedures IV Hydration (Bolus LR IV ) IV Zofran 4mg  x 1 Toradol 30mg  IV x  1 Urine Drug Test  After U tox will see if Narcotics can be given. Unable to void    Assessment and Plan  S/P RLTCS with Dr Su Hilt 04/23/12 for IUGR Patient reports great improvement with IV Hydration, IV Zofran 4mg s, IV Toradol 30 mgs. Desires d/c home. Prescription given for Percocet 10/325mg s 30 tablets, no refills F[facility]u [facility] weeks at [facility] or as needed  Tina [name] 04/27/2012, 1:47 PM

## 2012-04-27 NOTE — MAU Note (Signed)
Pt not in lobby.  

## 2012-04-27 NOTE — MAU Note (Signed)
Pt states had cesarean delivery on Friday, 04/23/2012 via Dr. Su Hilt. Rocking in pain and crying in triage room. Pt states low back pain, lower abd pain radiating to upper thighs began this am. Had rx for percocet, was doing hair this am, bottle was open and reached for something, pain medication fell into commode. Has urgency with voiding.

## 2012-04-27 NOTE — Telephone Encounter (Signed)
Triage/pain meds/req.
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
## 2012-08-25 ENCOUNTER — Encounter (HOSPITAL_COMMUNITY): Payer: Self-pay | Admitting: Emergency Medicine

## 2012-08-25 ENCOUNTER — Emergency Department (HOSPITAL_COMMUNITY): Payer: Medicaid Other

## 2012-08-25 ENCOUNTER — Emergency Department (HOSPITAL_COMMUNITY)
Admission: EM | Admit: 2012-08-25 | Discharge: 2012-08-25 | Disposition: A | Discharge status: Home / Self Care | Payer: Medicaid Other | Attending: Emergency Medicine | Admitting: Emergency Medicine

## 2012-08-25 DIAGNOSIS — Z8659 Personal history of other mental and behavioral disorders: Secondary | ICD-10-CM | POA: Insufficient documentation

## 2012-08-25 DIAGNOSIS — Z8619 Personal history of other infectious and parasitic diseases: Secondary | ICD-10-CM | POA: Insufficient documentation

## 2012-08-25 DIAGNOSIS — Z8632 Personal history of gestational diabetes: Secondary | ICD-10-CM | POA: Insufficient documentation

## 2012-08-25 DIAGNOSIS — F17200 Nicotine dependence, unspecified, uncomplicated: Secondary | ICD-10-CM | POA: Insufficient documentation

## 2012-08-25 DIAGNOSIS — Z8679 Personal history of other diseases of the circulatory system: Secondary | ICD-10-CM | POA: Insufficient documentation

## 2012-08-25 DIAGNOSIS — S51809A Unspecified open wound of unspecified forearm, initial encounter: Secondary | ICD-10-CM | POA: Insufficient documentation

## 2012-08-25 DIAGNOSIS — Z8719 Personal history of other diseases of the digestive system: Secondary | ICD-10-CM | POA: Insufficient documentation

## 2012-08-25 DIAGNOSIS — S41119A Laceration without foreign body of unspecified upper arm, initial encounter: Secondary | ICD-10-CM

## 2012-08-25 MED ORDER — TETANUS-DIPHTH-ACELL PERTUSSIS 5-2.5-18.5 LF-MCG/0.5 IM SUSP: 0.5000 mL | Freq: Once | INTRAMUSCULAR | Status: DC

## 2012-08-25 MED ORDER — HYDROCODONE-ACETAMINOPHEN 5-325 MG PO TABS
2.0000 | ORAL_TABLET | Freq: Once | ORAL | Status: AC
  Administered 2012-08-25: 2 via ORAL
  Filled 2012-08-25: qty 2

## 2012-08-25 MED ORDER — HYDROCODONE-ACETAMINOPHEN 5-325 MG PO TABS: 1.0000 | ORAL_TABLET | Freq: Four times a day (QID) | ORAL | Status: DC | PRN
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
## 2012-08-25 NOTE — ED Provider Notes (Signed)
Medical screening examination/treatment/procedure(s) were conducted as a shared visit with non-physician practitioner(s) and myself.  I personally evaluated the patient during the encounter  Jermell Holeman T Rosina Cressler, MD 08/25/12 1520 

## 2012-08-25 NOTE — ED Notes (Signed)
PA at bedside.

## 2012-08-25 NOTE — ED Provider Notes (Signed)
Medical screening examination/treatment/procedure(s) were conducted as a shared visit with non-physician practitioner(s) and myself.  I personally evaluated the patient during the encounter  Patient examined and has laceration to the volar aspect of her right forearm. Obvious tendon involvement. I spoke with Dr. Melvyn Novas, hand surgeon on call, and patient will have her wound closed, splint applied, he will see her in followup  Toy Baker, MD 08/25/12 1013
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
## 2012-12-17 ENCOUNTER — Other Ambulatory Visit: Payer: Self-pay

## 2012-12-17 LAB — OB RESULTS CONSOLE HEPATITIS B SURFACE ANTIGEN: Hepatitis B Surface Ag: NEGATIVE

## 2012-12-17 LAB — OB RESULTS CONSOLE GC/CHLAMYDIA: Gonorrhea: NEGATIVE

## 2012-12-28 ENCOUNTER — Ambulatory Visit (HOSPITAL_COMMUNITY): Payer: Medicaid Other

## 2013-01-04 ENCOUNTER — Ambulatory Visit (HOSPITAL_COMMUNITY): Payer: Medicaid Other

## 2013-01-11 ENCOUNTER — Encounter (HOSPITAL_COMMUNITY): Payer: Self-pay

## 2013-01-11 ENCOUNTER — Ambulatory Visit (HOSPITAL_COMMUNITY): Admission: RE | Admit: 2013-01-11 | Payer: Medicaid Other | Source: Ambulatory Visit

## 2013-02-22 ENCOUNTER — Ambulatory Visit (HOSPITAL_COMMUNITY): Payer: Medicaid Other | Attending: Family Medicine

## 2013-02-28 IMAGING — US US OB MCA DOPPLER
1 series · 12 of 12 positions shown · non-contrast
Comparison: none

[Series 1: us ob mca doppler · 0.23mm/px · 12 of 12 slices shown]
[im 1/12]
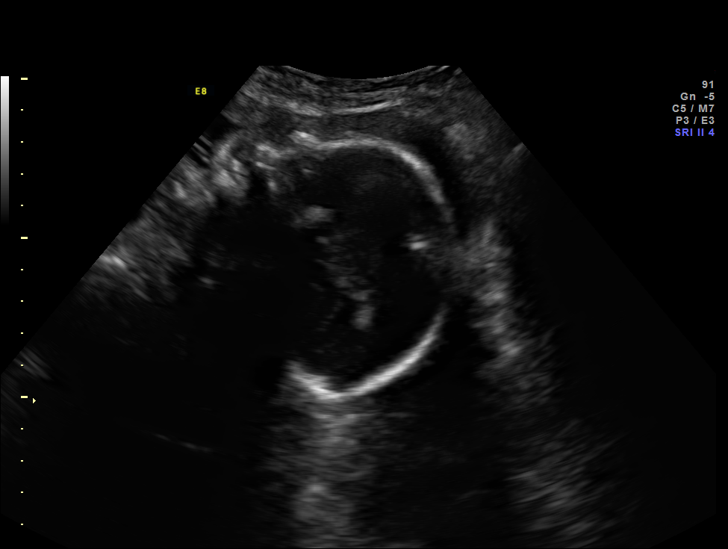
[im 2/12]
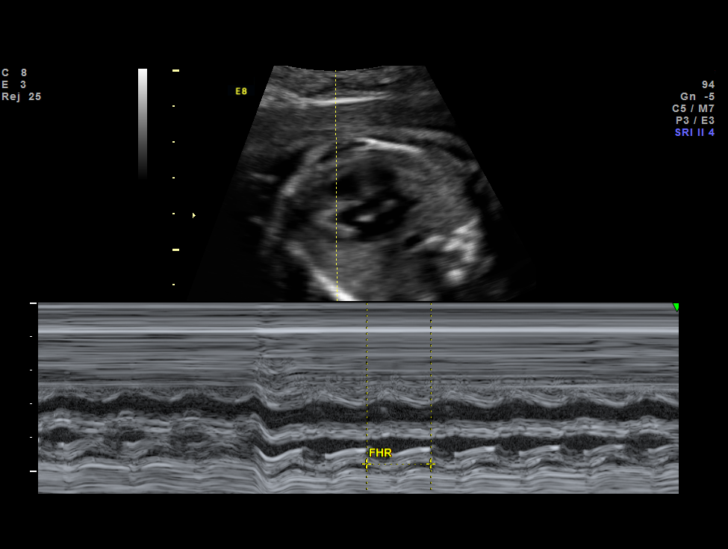
[im 3/12]
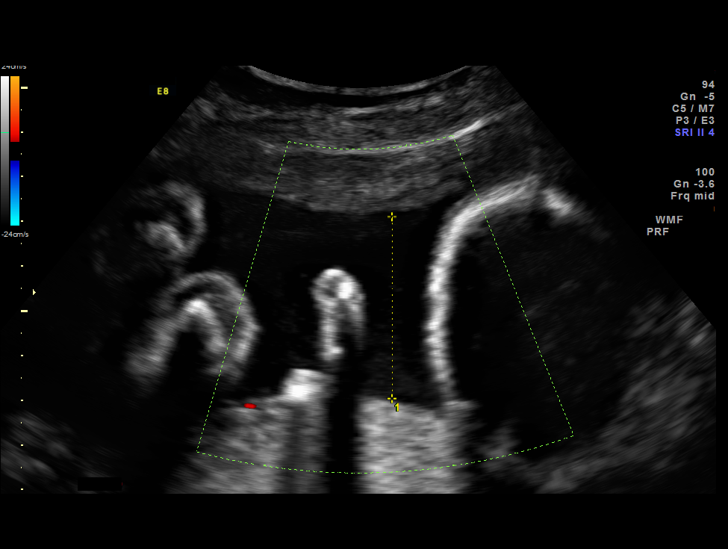
[im 4/12]
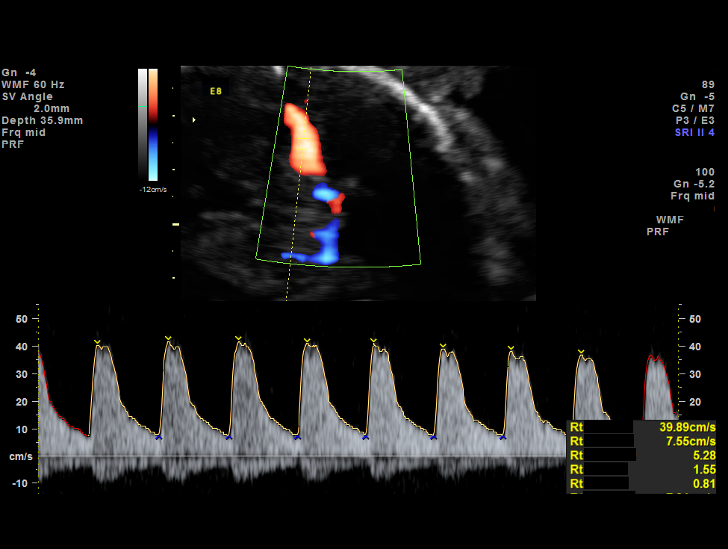
[im 5/12]
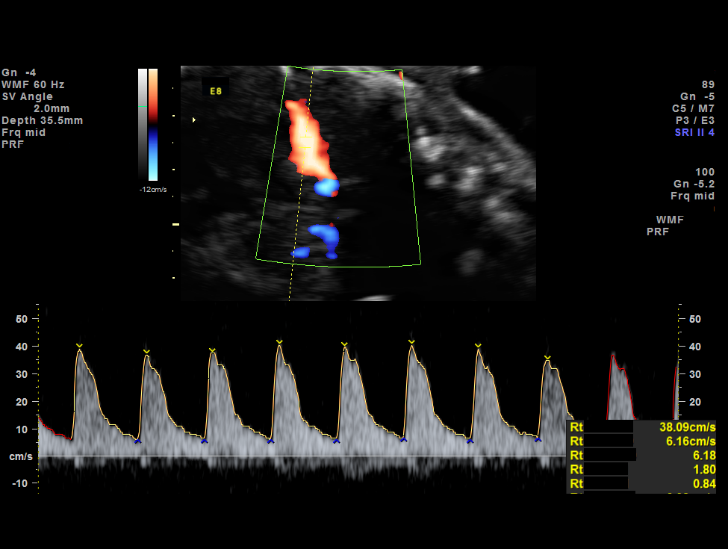
[im 6/12]
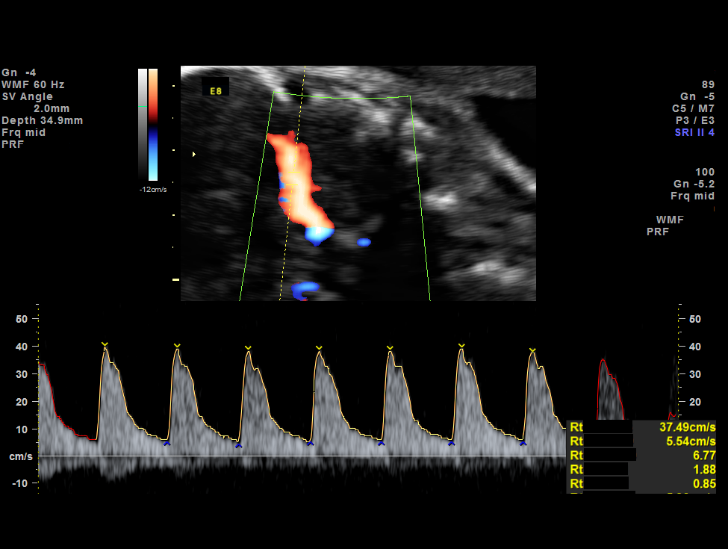
[im 7/12]
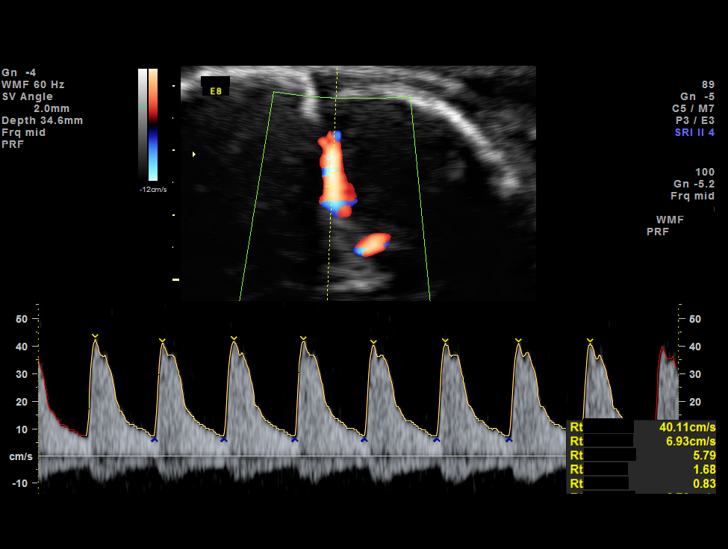
[im 8/12]
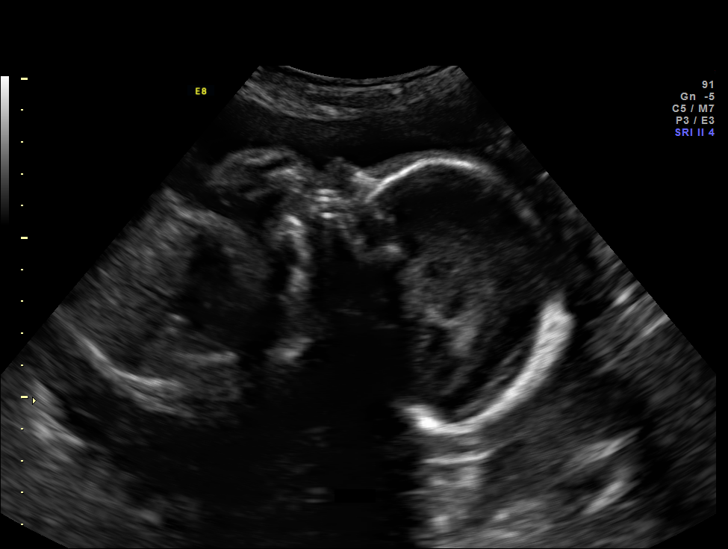
[im 9/12]
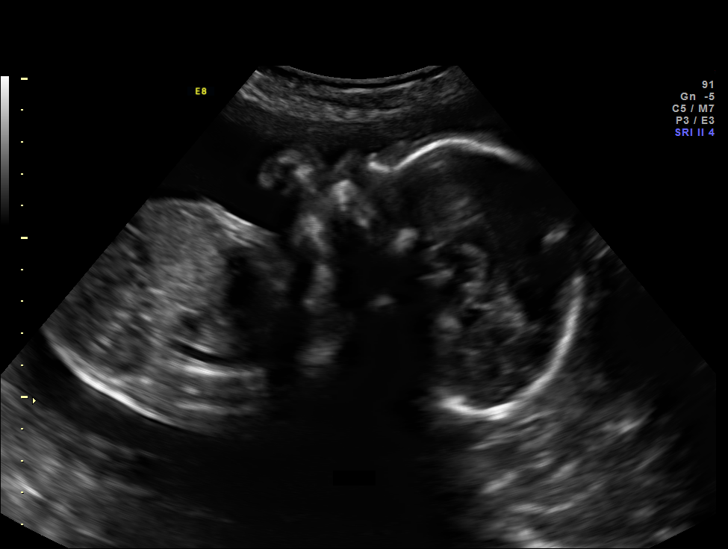
[im 10/12]
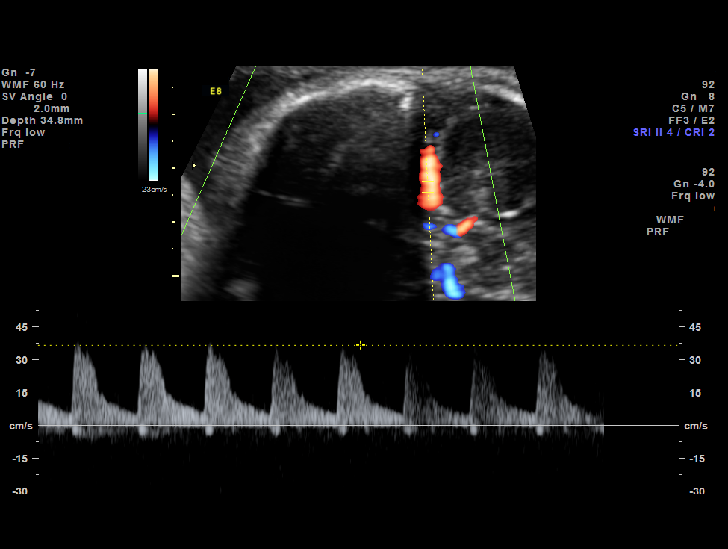
[im 11/12]
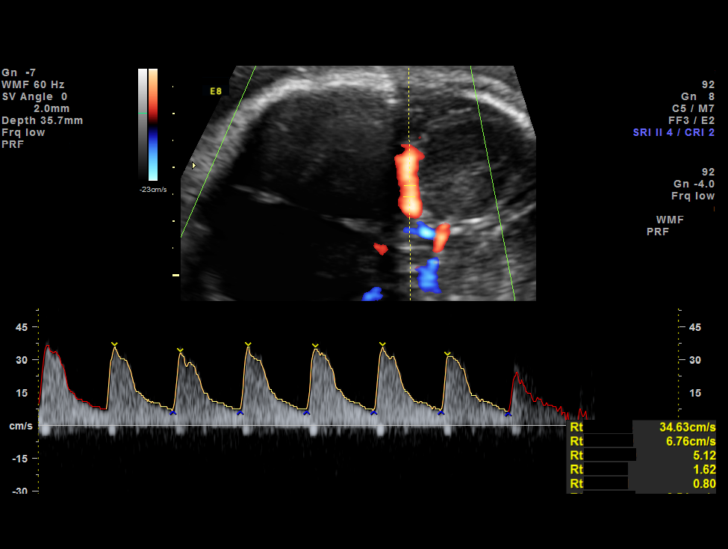
[im 12/12]
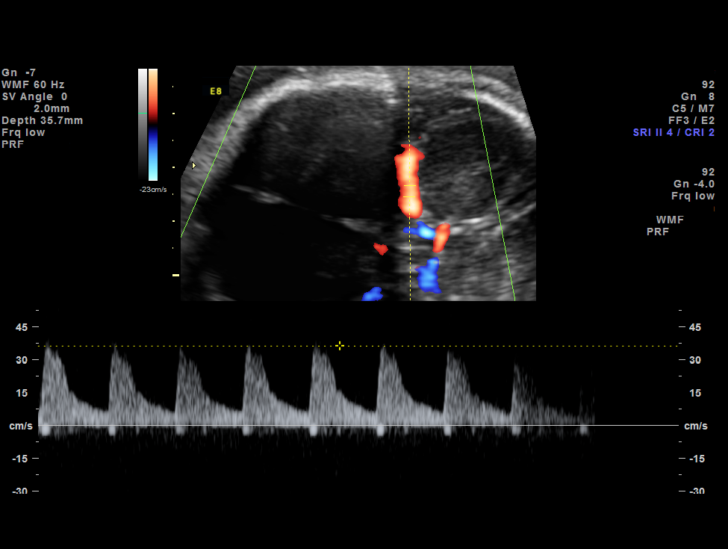

[12 of 12 positions shown; findings below may reference images not displayed]

OBSTETRICS REPORT
                      (Signed Final 02/18/2012 [DATE])

 Order#:         [id number]
Procedures

 US MCA DOPPLER                                        76821.0
Indications

 Positive Anti-D antibody; titer 32
 Poor obstetric history: Previous gestational
 diabetes
 No Prenatal Care
 Previous cesarean section
 Assess fetal well being
Fetal Evaluation

 Fetal Heart Rate:  157                          bpm
 Cardiac Activity:  Observed
 Presentation:      Cephalic

 Amniotic Fluid
 AFI FV:      Subjectively within normal limits
                                             Larg Pckt:     4.0  cm
Gestational Age

 LMP:           27w 5d        Date:  08/08/11                 EDD:   05/14/12
 Best:          27w 5d     Det. By:  LMP  (08/08/11)          EDD:   05/14/12
Doppler - Fetal Vessels

 Middle Cerebral Artery
 S/D:   6                              RI:
 PI:    1.73           14  %tile       PSV:       40.11   cm/s       1.1  MoM

Impression

 IUP at  27+5 weeks
 Normal amniotic fluid volume
 No evidence of hydrops
 MCA dopplers: peak systolic velocity 1.1 MoM which
 indicates no fetal anemia
Recommendations

 US for anatomy, growth and MCA dopplers in 2 weeks

## 2013-03-01 ENCOUNTER — Other Ambulatory Visit: Payer: Self-pay

## 2013-03-08 ENCOUNTER — Ambulatory Visit (HOSPITAL_COMMUNITY)
Admission: RE | Admit: 2013-03-08 | Discharge: 2013-03-08 | Disposition: A | Discharge status: Home / Self Care | Payer: Medicaid Other | Source: Ambulatory Visit | Attending: Obstetrics and Gynecology | Admitting: Obstetrics and Gynecology

## 2013-03-08 ENCOUNTER — Other Ambulatory Visit: Payer: Self-pay | Admitting: Obstetrics and Gynecology

## 2013-03-08 ENCOUNTER — Encounter (HOSPITAL_COMMUNITY): Payer: Self-pay

## 2013-03-08 DIAGNOSIS — O360931 Maternal care for other rhesus isoimmunization, third trimester, fetus 1: Secondary | ICD-10-CM

## 2013-03-08 DIAGNOSIS — O360191 Maternal care for anti-D [Rh] antibodies, unspecified trimester, fetus 1: Secondary | ICD-10-CM

## 2013-03-08 DIAGNOSIS — F17200 Nicotine dependence, unspecified, uncomplicated: Secondary | ICD-10-CM

## 2013-03-08 DIAGNOSIS — O360990 Maternal care for other rhesus isoimmunization, unspecified trimester, not applicable or unspecified: Secondary | ICD-10-CM | POA: Insufficient documentation

## 2013-03-08 DIAGNOSIS — F1210 Cannabis abuse, uncomplicated: Secondary | ICD-10-CM

## 2013-03-08 DIAGNOSIS — O34219 Maternal care for unspecified type scar from previous cesarean delivery: Secondary | ICD-10-CM | POA: Insufficient documentation

## 2013-03-08 NOTE — Consult Note (Signed)
Maternal Fetal Medicine Consultation  Requesting Provider(s): [name], MD  Reason for consultation: Anti-D alloimmunization  HPI: Tina [name] is a 32 yo [id number] currently at 12 3/7 weeks seen today due to Rh alloimmunization.  Tina [name] first reported Rh sensitization in 2005 - although not clear whether it was positive at her first OB visit or wether it occurred later in pregnancy.  In her subsequent pregnancies, she was followed with MCA Doppler studies by MFM, but never required intrauterine transfusions or exchange transfusions post partum - but does report that two of her children were jaundiced and required bili lights after delivery. Her most recent delivery was in 2013 - was followed due to suspected fetal growth restriction and Rh alloimmunization.  Her titers during that pregnancy were 32.  She underwent a scheduled C-section at 37 weeks due to suspected growth restriction.  The patient's current pregnancy has been complicated by placenta previa, Tobacco and Cannabis use.  She has been poorly compliant with her OB care.  The father of the baby is currently incarcerated - not sure of his blood type.  Two of her previous pregnancies were complicated by gestational diabetes.  She is without complaints today.  OB History: OB History   Grav Para Term Preterm Abortions TAB SAB Ect Mult Living   6 4 4  0 1 0 1 0 0 4      PMH:  Past Medical History  Diagnosis Date  . Gonorrhea   . Trichimoniasis   . Hx of chlamydia infection   . H/O bacterial infection   . H/O varicella   . Headache(784.0)     Frequent  . Acid reflux   . Postpartum depression   . Depression     NO MEDS  . Abnormal Pap smear     2003 ASC-US 2005 ASC-US LSIL high risk HPV;LAST PAP A WHILE AGO  . 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 H/O migraine   . Gestational diabetes 2008    NO MEDS;DIET CONTROLLED  . Gestational diabetes mellitus 2012    NO MEDS; DIET CONTROLLED  . 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 Status post cesarean delivery 04/23/2012    PSH:  Past Surgical History  Procedure Laterality Date  . Cesarean section  2005    x4;FIRST CHILD IN FETAL DISTRESS  . Cesarean section  04/23/2012    Procedure: CESAREAN SECTION;  Surgeon: Purcell Nails, MD;  Location: [facility];  Service: Obstetrics;  Laterality: N/A;   Meds:  PNV  Allergies: No Known Allergies  FH: denies family history of birth defects or hereditary disorders  Soc: smokes approx 4 cigarettes/ day, denies ETOH use.  Review of Systems: no vaginal bleeding or cramping/contractions, no LOF, no nausea/vomiting. All other systems reviewed and are negative.  PE:  Wt: 151#, BP 128/83, Pulse 90    GEN: well-appearing female ABD: gravid, NT  Ultrasound: Single IUP at 29 2/7 weeks Limited ultrasound performed for MCA Dopplers and to access placental location No evidence of fetal hydrops MCA Dopplers 1.25 MoM (low risk for fetal anemia) Normal amniotic fluid volume  TVUS - right lateral, low lying placenta - leading edge of the placenta approximately 2 cm from the internal os.    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  In general would expect worsening disease with each subsequent pregnancy - although not able to confirm, would  suspect that one or more of her children is Rh negative. 3) s/p 5 C-sections 4) History of Gestational diabetes in 2 prior pregnancies 5) Low lying placenta (mostly posterior) - no bleeding since the first trimester  Plan: 1) Will check titer today if not recently performed 2) Fetal genotype - blood work sent today 3) Recommend weekly MCA Dopplers (at least initially) pending the results of the fetal genotype; may decrease surveillance to every other week if stable. If the fetus is D negative, would resume routine OB care. 4) If there is no evidence of fetal anemia by  MCA Dopplers - would recommend repeat C-section at 37-[redacted] weeks gestation due to Rh alloimmunization (or earlier if for other clinical indications).  If the fetus is D negative, would recommend routine care (repeat C-section at 39 weeks)   Thank you for the opportunity to be a part of the care of [name]. Please contact our office if we can be of further assistance.   I spent approximately 30 minutes with this patient with over 50% of time spent in face-to-face counseling.  Alpha Gula, MD Maternal Fetal Medicine

## 2013-03-09 ENCOUNTER — Other Ambulatory Visit: Payer: Self-pay

## 2013-03-15 ENCOUNTER — Ambulatory Visit (HOSPITAL_COMMUNITY): Payer: Medicaid Other

## 2013-03-15 ENCOUNTER — Telehealth (HOSPITAL_COMMUNITY): Payer: Self-pay | Admitting: *Deleted

## 2013-03-15 NOTE — Telephone Encounter (Signed)
Pt had an [place] appointment today for dopplers and to go over RHD genotyping and antibody titer results.  Pt did not show for appointment today.  Attempted to call patient to give results.  Phone number is either invalid or has been disconnected.  Will fax results to primary office.

## 2013-03-22 ENCOUNTER — Other Ambulatory Visit: Payer: Self-pay | Admitting: Obstetrics and Gynecology

## 2013-03-22 ENCOUNTER — Other Ambulatory Visit: Payer: Self-pay | Admitting: Obstetrics & Gynecology

## 2013-03-22 DIAGNOSIS — O2690 Pregnancy related conditions, unspecified, unspecified trimester: Secondary | ICD-10-CM

## 2013-03-22 DIAGNOSIS — O360191 Maternal care for anti-D [Rh] antibodies, unspecified trimester, fetus 1: Secondary | ICD-10-CM

## 2013-03-24 ENCOUNTER — Ambulatory Visit (HOSPITAL_COMMUNITY): Payer: Medicaid Other | Attending: Obstetrics and Gynecology

## 2013-04-06 ENCOUNTER — Other Ambulatory Visit: Payer: Self-pay | Admitting: Obstetrics & Gynecology

## 2013-04-06 DIAGNOSIS — O2690 Pregnancy related conditions, unspecified, unspecified trimester: Secondary | ICD-10-CM

## 2013-04-07 ENCOUNTER — Ambulatory Visit (HOSPITAL_COMMUNITY)
Admission: RE | Admit: 2013-04-07 | Discharge: 2013-04-07 | Disposition: A | Discharge status: Home / Self Care | Payer: Medicaid Other | Source: Ambulatory Visit | Attending: Obstetrics and Gynecology | Admitting: Obstetrics and Gynecology

## 2013-04-07 ENCOUNTER — Other Ambulatory Visit: Payer: Self-pay | Admitting: Obstetrics & Gynecology

## 2013-04-07 ENCOUNTER — Encounter (HOSPITAL_COMMUNITY): Payer: Self-pay

## 2013-04-07 ENCOUNTER — Other Ambulatory Visit: Payer: Self-pay | Admitting: Obstetrics and Gynecology

## 2013-04-07 DIAGNOSIS — O2690 Pregnancy related conditions, unspecified, unspecified trimester: Secondary | ICD-10-CM

## 2013-04-07 DIAGNOSIS — O34219 Maternal care for unspecified type scar from previous cesarean delivery: Secondary | ICD-10-CM | POA: Insufficient documentation

## 2013-04-07 DIAGNOSIS — O360990 Maternal care for other rhesus isoimmunization, unspecified trimester, not applicable or unspecified: Secondary | ICD-10-CM | POA: Insufficient documentation

## 2013-04-08 ENCOUNTER — Other Ambulatory Visit: Payer: Self-pay | Admitting: Obstetrics and Gynecology

## 2013-04-08 ENCOUNTER — Other Ambulatory Visit: Payer: Self-pay | Admitting: Obstetrics & Gynecology

## 2013-04-08 DIAGNOSIS — O360131 Maternal care for anti-D [Rh] antibodies, third trimester, fetus 1: Secondary | ICD-10-CM

## 2013-04-12 ENCOUNTER — Other Ambulatory Visit: Payer: Self-pay | Admitting: Obstetrics and Gynecology

## 2013-04-12 ENCOUNTER — Ambulatory Visit (HOSPITAL_COMMUNITY)
Admission: RE | Admit: 2013-04-12 | Discharge: 2013-04-12 | Disposition: A | Discharge status: Home / Self Care | Payer: Medicaid Other | Source: Ambulatory Visit | Attending: Obstetrics and Gynecology | Admitting: Obstetrics and Gynecology

## 2013-04-12 VITALS — BP 139/84 | HR 100 | Wt 155.0 lb

## 2013-04-12 DIAGNOSIS — O365931 Maternal care for other known or suspected poor fetal growth, third trimester, fetus 1: Secondary | ICD-10-CM

## 2013-04-12 DIAGNOSIS — O34219 Maternal care for unspecified type scar from previous cesarean delivery: Secondary | ICD-10-CM | POA: Insufficient documentation

## 2013-04-12 DIAGNOSIS — O360990 Maternal care for other rhesus isoimmunization, unspecified trimester, not applicable or unspecified: Secondary | ICD-10-CM | POA: Insufficient documentation

## 2013-04-12 DIAGNOSIS — F17200 Nicotine dependence, unspecified, uncomplicated: Secondary | ICD-10-CM

## 2013-04-12 DIAGNOSIS — O360131 Maternal care for anti-D [Rh] antibodies, third trimester, fetus 1: Secondary | ICD-10-CM

## 2013-04-12 NOTE — Progress Notes (Signed)
Tina [name]  was seen today for an ultrasound appointment.  See full report in [facility].  Impression: Single IUP at 34 4/7 weeks Follow up due to marginal fetal growth (EFW at the 15th %tile and all biometric measurements < 10th %tile at last visit), Rh alloimmunization Active fetus - BPP of 8/8 Normal UA Dopplers for gestational age Normal amniotic fluid volume  Recommendations: Continue weekly BPPs/ UA Dopplers MCA Dopplers next week Ultrasound for interval growth in 2 weeks  Alpha Gula, MD

## 2013-04-19 ENCOUNTER — Other Ambulatory Visit (HOSPITAL_COMMUNITY): Payer: Self-pay | Admitting: Maternal and Fetal Medicine

## 2013-04-19 ENCOUNTER — Ambulatory Visit (HOSPITAL_COMMUNITY)
Admission: RE | Admit: 2013-04-19 | Discharge: 2013-04-19 | Disposition: A | Discharge status: Home / Self Care | Payer: Medicaid Other | Source: Ambulatory Visit | Attending: Obstetrics and Gynecology | Admitting: Obstetrics and Gynecology

## 2013-04-19 VITALS — BP 120/74 | HR 97 | Wt 158.0 lb

## 2013-04-19 DIAGNOSIS — F17200 Nicotine dependence, unspecified, uncomplicated: Secondary | ICD-10-CM

## 2013-04-19 DIAGNOSIS — O360131 Maternal care for anti-D [Rh] antibodies, third trimester, fetus 1: Secondary | ICD-10-CM

## 2013-04-19 DIAGNOSIS — O34219 Maternal care for unspecified type scar from previous cesarean delivery: Secondary | ICD-10-CM | POA: Insufficient documentation

## 2013-04-19 DIAGNOSIS — O365931 Maternal care for other known or suspected poor fetal growth, third trimester, fetus 1: Secondary | ICD-10-CM

## 2013-04-19 DIAGNOSIS — O360990 Maternal care for other rhesus isoimmunization, unspecified trimester, not applicable or unspecified: Secondary | ICD-10-CM | POA: Insufficient documentation

## 2013-04-19 DIAGNOSIS — O365990 Maternal care for other known or suspected poor fetal growth, unspecified trimester, not applicable or unspecified: Secondary | ICD-10-CM | POA: Insufficient documentation

## 2013-04-19 NOTE — Progress Notes (Signed)
Tina [name]  was seen today for an ultrasound appointment.  See full report in [facility].  Impression: Single IUP at 35 4/7 weeks Follow up due to marginal fetal growth (EFW at the 15th %tile and all biometric measurements < 10th %tile at last visit), Rh alloimmunization Active fetus - BPP of 8/8 Normal UA Dopplers for gestational age Normal MCA Doppler studies (1.21 MoM - low risk for fetal anemia) Normal amniotic fluid volume  Recommendations: Continue weekly BPPs/ UA Doppler Ultrasound for interval growth next week.  Tina [name] is tentatively scheduled for repeat C-section on 8 Sept.  Alpha Gula, MD

## 2013-04-20 LAB — OB RESULTS CONSOLE GBS: GBS: NEGATIVE

## 2013-04-21 ENCOUNTER — Other Ambulatory Visit: Payer: Self-pay | Admitting: Obstetrics and Gynecology

## 2013-04-21 DIAGNOSIS — O43213 Placenta accreta, third trimester: Secondary | ICD-10-CM

## 2013-04-22 ENCOUNTER — Ambulatory Visit (HOSPITAL_COMMUNITY): Payer: Medicaid Other

## 2013-04-26 ENCOUNTER — Ambulatory Visit (HOSPITAL_COMMUNITY): Admission: RE | Admit: 2013-04-26 | Payer: Medicaid Other | Source: Ambulatory Visit

## 2013-04-26 ENCOUNTER — Encounter (HOSPITAL_COMMUNITY): Payer: Self-pay | Admitting: Pharmacist

## 2013-04-28 ENCOUNTER — Ambulatory Visit (HOSPITAL_COMMUNITY): Payer: Medicaid Other

## 2013-04-29 ENCOUNTER — Ambulatory Visit (HOSPITAL_COMMUNITY)
Admission: RE | Admit: 2013-04-29 | Discharge: 2013-04-29 | Disposition: A | Discharge status: Home / Self Care | Payer: Medicaid Other | Source: Ambulatory Visit | Attending: Obstetrics and Gynecology | Admitting: Obstetrics and Gynecology

## 2013-04-29 ENCOUNTER — Other Ambulatory Visit: Payer: Medicaid Other

## 2013-05-02 ENCOUNTER — Encounter (HOSPITAL_COMMUNITY): Payer: Self-pay

## 2013-05-02 ENCOUNTER — Encounter (HOSPITAL_COMMUNITY)
Admission: RE | Admit: 2013-05-02 | Discharge: 2013-05-02 | Disposition: A | Discharge status: Home / Self Care | Payer: Medicaid Other | Source: Ambulatory Visit | Attending: Obstetrics and Gynecology | Admitting: Obstetrics and Gynecology

## 2013-05-02 LAB — CBC
MCV: 88.2 fL (ref 78.0–100.0)
Platelets: 173 10*3/uL (ref 150–400)
RBC: 3.57 MIL/uL — ABNORMAL LOW (ref 3.87–5.11)
WBC: 8.5 10*3/uL (ref 4.0–10.5)

## 2013-05-02 NOTE — Patient Instructions (Addendum)
20 SOPHIAMARIE NEASE  05/02/2013   Your procedure is scheduled on:  05/03/13  Enter through the Main Entrance of [facility] at 1130 AM.  Pick up the phone at the desk and dial [phone].   Call this number if you have problems the morning of surgery: [phone]   Remember:   Do not eat food:After Midnight.  Do not drink clear liquids: 4 Hours before arrival.  Take these medicines the morning of surgery with A SIP OF WATER: NA   Do not wear jewelry, make-up or nail polish.  Do not wear lotions, powders, or perfumes. You may wear deodorant.  Do not shave 48 hours prior to surgery.  Do not bring valuables to the hospital.  [facility] is not   responsible for any belongings or valuables brought to the hospital.  Contacts, dentures or bridgework may not be worn into surgery.  Leave suitcase in the car. After surgery it may be brought to your room.  For patients admitted to the hospital, checkout time is 11:00 AM the day of              discharge.   Patients discharged the day of surgery will not be allowed to drive             home.  Name and phone number of your driver: NA  Special Instructions:   Shower using CHG 2 nights before surgery and the night before surgery.  If you shower the day of surgery use CHG.  Use special wash - you have one bottle of CHG for all showers.  You should use approximately 1/3 of the bottle for each shower.   Please read over the following fact sheets that you were given:   Surgical Site Infection Prevention

## 2013-05-03 ENCOUNTER — Inpatient Hospital Stay (HOSPITAL_COMMUNITY): Payer: Medicaid Other | Admitting: Anesthesiology

## 2013-05-03 ENCOUNTER — Encounter (HOSPITAL_COMMUNITY): Payer: Self-pay | Admitting: *Deleted

## 2013-05-03 ENCOUNTER — Encounter (HOSPITAL_COMMUNITY)
Admission: RE | Disposition: A | Discharge status: Home / Self Care | Payer: Self-pay | Source: Ambulatory Visit | Attending: Obstetrics and Gynecology

## 2013-05-03 ENCOUNTER — Ambulatory Visit (HOSPITAL_COMMUNITY)
Admission: RE | Admit: 2013-05-03 | Discharge: 2013-05-03 | Disposition: A | Discharge status: Home / Self Care | Payer: Medicaid Other | Source: Ambulatory Visit | Attending: Obstetrics and Gynecology | Admitting: Obstetrics and Gynecology

## 2013-05-03 ENCOUNTER — Inpatient Hospital Stay (HOSPITAL_COMMUNITY)
Admission: RE | Admit: 2013-05-03 | Discharge: 2013-05-06 | DRG: 766 | Disposition: A | Discharge status: Home / Self Care | Payer: Medicaid Other | Source: Ambulatory Visit | Attending: Obstetrics and Gynecology | Admitting: Obstetrics and Gynecology

## 2013-05-03 ENCOUNTER — Encounter (HOSPITAL_COMMUNITY): Payer: Self-pay | Admitting: Anesthesiology

## 2013-05-03 DIAGNOSIS — O34219 Maternal care for unspecified type scar from previous cesarean delivery: Principal | ICD-10-CM | POA: Diagnosis present

## 2013-05-03 DIAGNOSIS — D649 Anemia, unspecified: Secondary | ICD-10-CM | POA: Diagnosis present

## 2013-05-03 DIAGNOSIS — O43213 Placenta accreta, third trimester: Secondary | ICD-10-CM

## 2013-05-03 DIAGNOSIS — O9902 Anemia complicating childbirth: Secondary | ICD-10-CM | POA: Diagnosis present

## 2013-05-03 LAB — PREPARE RBC (CROSSMATCH)

## 2013-05-03 SURGERY — Surgical Case
Anesthesia: Epidural | Site: Abdomen | Wound class: Clean Contaminated

## 2013-05-03 MED ORDER — PNEUMOCOCCAL VAC POLYVALENT 25 MCG/0.5ML IJ INJ
0.5000 mL | INJECTION | INTRAMUSCULAR | Status: AC
  Filled 2013-05-03: qty 0.5

## 2013-05-03 MED ORDER — DIPHENHYDRAMINE HCL 50 MG/ML IJ SOLN: 25.0000 mg | INTRAMUSCULAR | Status: DC | PRN

## 2013-05-03 MED ORDER — METOCLOPRAMIDE HCL 5 MG/ML IJ SOLN: 10.0000 mg | Freq: Three times a day (TID) | INTRAMUSCULAR | Status: DC | PRN

## 2013-05-03 MED ORDER — NALOXONE HCL 0.4 MG/ML IJ SOLN: 0.4000 mg | INTRAMUSCULAR | Status: DC | PRN

## 2013-05-03 MED ORDER — DIPHENHYDRAMINE HCL 50 MG/ML IJ SOLN: 12.5000 mg | INTRAMUSCULAR | Status: DC | PRN

## 2013-05-03 MED ORDER — MIDAZOLAM HCL 2 MG/2ML IJ SOLN: 0.5000 mg | Freq: Once | INTRAMUSCULAR | Status: AC | PRN

## 2013-05-03 MED ORDER — OXYTOCIN 40 UNITS IN LACTATED RINGERS INFUSION - SIMPLE MED: 62.5000 mL/h | INTRAVENOUS | Status: AC

## 2013-05-03 MED ORDER — SENNOSIDES-DOCUSATE SODIUM 8.6-50 MG PO TABS
2.0000 | ORAL_TABLET | ORAL | Status: DC
  Administered 2013-05-04 – 2013-05-06 (×3): 2 via ORAL

## 2013-05-03 MED ORDER — ZOLPIDEM TARTRATE 5 MG PO TABS: 5.0000 mg | ORAL_TABLET | Freq: Every evening | ORAL | Status: DC | PRN

## 2013-05-03 MED ORDER — MORPHINE SULFATE (PF) 0.5 MG/ML IJ SOLN
INTRAMUSCULAR | Status: DC | PRN
  Administered 2013-05-03: 1 mg via INTRAVENOUS

## 2013-05-03 MED ORDER — DIPHENHYDRAMINE HCL 25 MG PO CAPS: 25.0000 mg | ORAL_CAPSULE | ORAL | Status: DC | PRN

## 2013-05-03 MED ORDER — DIBUCAINE 1 % RE OINT
1.0000 "application " | TOPICAL_OINTMENT | RECTAL | Status: DC | PRN
  Administered 2013-05-04: 1 via RECTAL
  Filled 2013-05-03: qty 28

## 2013-05-03 MED ORDER — FENTANYL CITRATE 0.05 MG/ML IJ SOLN
25.0000 ug | INTRAMUSCULAR | Status: DC | PRN
  Administered 2013-05-03: 50 ug via INTRAVENOUS

## 2013-05-03 MED ORDER — MIDAZOLAM HCL 2 MG/2ML IJ SOLN: 0.5000 mg | Freq: Once | INTRAMUSCULAR | Status: DC | PRN

## 2013-05-03 MED ORDER — SCOPOLAMINE 1 MG/3DAYS TD PT72
MEDICATED_PATCH | TRANSDERMAL | Status: AC
  Administered 2013-05-03: 1.5 mg via TRANSDERMAL
  Filled 2013-05-03: qty 1

## 2013-05-03 MED ORDER — MENTHOL 3 MG MT LOZG: 1.0000 | LOZENGE | OROMUCOSAL | Status: DC | PRN

## 2013-05-03 MED ORDER — KETOROLAC TROMETHAMINE 60 MG/2ML IM SOLN
INTRAMUSCULAR | Status: AC
  Administered 2013-05-03: 60 mg
  Filled 2013-05-03: qty 2

## 2013-05-03 MED ORDER — ONDANSETRON HCL 4 MG/2ML IJ SOLN: 4.0000 mg | INTRAMUSCULAR | Status: DC | PRN

## 2013-05-03 MED ORDER — FENTANYL CITRATE 0.05 MG/ML IJ SOLN: 25.0000 ug | INTRAMUSCULAR | Status: DC | PRN

## 2013-05-03 MED ORDER — NALBUPHINE SYRINGE 5 MG/0.5 ML
INJECTION | INTRAMUSCULAR | Status: AC
  Administered 2013-05-03: 10 mg via INTRAVENOUS
  Filled 2013-05-03: qty 1

## 2013-05-03 MED ORDER — TETANUS-DIPHTH-ACELL PERTUSSIS 5-2.5-18.5 LF-MCG/0.5 IM SUSP
0.5000 mL | Freq: Once | INTRAMUSCULAR | Status: AC
  Administered 2013-05-04: 0.5 mL via INTRAMUSCULAR

## 2013-05-03 MED ORDER — WITCH HAZEL-GLYCERIN EX PADS: 1.0000 "application " | MEDICATED_PAD | CUTANEOUS | Status: DC | PRN

## 2013-05-03 MED ORDER — KETOROLAC TROMETHAMINE 30 MG/ML IJ SOLN: 30.0000 mg | Freq: Four times a day (QID) | INTRAMUSCULAR | Status: AC | PRN

## 2013-05-03 MED ORDER — NALOXONE HCL 1 MG/ML IJ SOLN: 1.0000 ug/kg/h | INTRAVENOUS | Status: DC | PRN

## 2013-05-03 MED ORDER — LACTATED RINGERS IV SOLN
INTRAVENOUS | Status: DC | PRN
  Administered 2013-05-03 (×2): via INTRAVENOUS

## 2013-05-03 MED ORDER — IBUPROFEN 600 MG PO TABS
600.0000 mg | ORAL_TABLET | Freq: Four times a day (QID) | ORAL | Status: DC
  Administered 2013-05-04 – 2013-05-06 (×11): 600 mg via ORAL
  Filled 2013-05-03 (×11): qty 1

## 2013-05-03 MED ORDER — LIDOCAINE-EPINEPHRINE (PF) 2 %-1:200000 IJ SOLN
INTRAMUSCULAR | Status: AC
  Filled 2013-05-03: qty 20

## 2013-05-03 MED ORDER — SIMETHICONE 80 MG PO CHEW
80.0000 mg | CHEWABLE_TABLET | Freq: Three times a day (TID) | ORAL | Status: DC
  Administered 2013-05-04 – 2013-05-06 (×10): 80 mg via ORAL

## 2013-05-03 MED ORDER — MORPHINE SULFATE 0.5 MG/ML IJ SOLN
INTRAMUSCULAR | Status: AC
  Filled 2013-05-03: qty 10

## 2013-05-03 MED ORDER — MEDROXYPROGESTERONE ACETATE 150 MG/ML IM SUSP
150.0000 mg | INTRAMUSCULAR | Status: AC | PRN
  Administered 2013-05-06: 150 mg via INTRAMUSCULAR

## 2013-05-03 MED ORDER — PROMETHAZINE HCL 25 MG/ML IJ SOLN: 6.2500 mg | INTRAMUSCULAR | Status: DC | PRN

## 2013-05-03 MED ORDER — SODIUM BICARBONATE 8.4 % IV SOLN
INTRAVENOUS | Status: AC
  Filled 2013-05-03: qty 50

## 2013-05-03 MED ORDER — MEPERIDINE HCL 25 MG/ML IJ SOLN: 6.2500 mg | INTRAMUSCULAR | Status: DC | PRN

## 2013-05-03 MED ORDER — NALBUPHINE HCL 10 MG/ML IJ SOLN
5.0000 mg | INTRAMUSCULAR | Status: DC | PRN
  Administered 2013-05-03: 10 mg via INTRAVENOUS
  Administered 2013-05-03: 5 mg via INTRAVENOUS
  Filled 2013-05-03: qty 0.5

## 2013-05-03 MED ORDER — SCOPOLAMINE 1 MG/3DAYS TD PT72
1.0000 | MEDICATED_PATCH | Freq: Once | TRANSDERMAL | Status: DC
  Administered 2013-05-03: 1.5 mg via TRANSDERMAL

## 2013-05-03 MED ORDER — SIMETHICONE 80 MG PO CHEW: 80.0000 mg | CHEWABLE_TABLET | ORAL | Status: DC | PRN

## 2013-05-03 MED ORDER — OXYTOCIN 10 UNIT/ML IJ SOLN
40.0000 [IU] | INTRAVENOUS | Status: DC | PRN
  Administered 2013-05-03: 40 [IU] via INTRAVENOUS

## 2013-05-03 MED ORDER — LANOLIN HYDROUS EX OINT: 1.0000 "application " | TOPICAL_OINTMENT | CUTANEOUS | Status: DC | PRN

## 2013-05-03 MED ORDER — OXYTOCIN 10 UNIT/ML IJ SOLN
INTRAMUSCULAR | Status: AC
  Filled 2013-05-03: qty 4

## 2013-05-03 MED ORDER — KETOROLAC TROMETHAMINE 30 MG/ML IJ SOLN: 15.0000 mg | Freq: Once | INTRAMUSCULAR | Status: DC | PRN

## 2013-05-03 MED ORDER — NALBUPHINE HCL 10 MG/ML IJ SOLN: 5.0000 mg | INTRAMUSCULAR | Status: DC | PRN

## 2013-05-03 MED ORDER — SODIUM BICARBONATE 8.4 % IV SOLN
INTRAVENOUS | Status: DC | PRN
  Administered 2013-05-03: 4 mL via EPIDURAL
  Administered 2013-05-03 (×2): 5 mL via EPIDURAL
  Administered 2013-05-03: 6 mL via EPIDURAL
  Administered 2013-05-03: 5 mL via EPIDURAL

## 2013-05-03 MED ORDER — FENTANYL CITRATE 0.05 MG/ML IJ SOLN
INTRAMUSCULAR | Status: AC
  Filled 2013-05-03: qty 2

## 2013-05-03 MED ORDER — ONDANSETRON HCL 4 MG/2ML IJ SOLN: 4.0000 mg | Freq: Three times a day (TID) | INTRAMUSCULAR | Status: DC | PRN

## 2013-05-03 MED ORDER — LACTATED RINGERS IV SOLN
Freq: Once | INTRAVENOUS | Status: AC
  Administered 2013-05-03: 13:00:00 via INTRAVENOUS

## 2013-05-03 MED ORDER — FENTANYL CITRATE 0.05 MG/ML IJ SOLN
INTRAMUSCULAR | Status: DC | PRN
  Administered 2013-05-03: 100 ug via EPIDURAL

## 2013-05-03 MED ORDER — FENTANYL CITRATE 0.05 MG/ML IJ SOLN
INTRAMUSCULAR | Status: AC
  Administered 2013-05-03: 50 ug via INTRAVENOUS
  Filled 2013-05-03: qty 2

## 2013-05-03 MED ORDER — PRENATAL MULTIVITAMIN CH
1.0000 | ORAL_TABLET | Freq: Every day | ORAL | Status: DC
  Administered 2013-05-04 – 2013-05-05 (×2): 1 via ORAL
  Filled 2013-05-03 (×2): qty 1

## 2013-05-03 MED ORDER — ONDANSETRON HCL 4 MG/2ML IJ SOLN
INTRAMUSCULAR | Status: AC
  Filled 2013-05-03: qty 2

## 2013-05-03 MED ORDER — CEFAZOLIN SODIUM-DEXTROSE 2-3 GM-% IV SOLR
2.0000 g | INTRAVENOUS | Status: AC
  Administered 2013-05-03: 2 g via INTRAVENOUS

## 2013-05-03 MED ORDER — ONDANSETRON HCL 4 MG/2ML IJ SOLN
INTRAMUSCULAR | Status: DC | PRN
  Administered 2013-05-03: 4 mg via INTRAVENOUS

## 2013-05-03 MED ORDER — LACTATED RINGERS IV SOLN
INTRAVENOUS | Status: DC
  Administered 2013-05-03 (×2): via INTRAVENOUS

## 2013-05-03 MED ORDER — FENTANYL CITRATE 0.05 MG/ML IJ SOLN
INTRAMUSCULAR | Status: DC | PRN
  Administered 2013-05-03: 100 ug via INTRAVENOUS

## 2013-05-03 MED ORDER — LACTATED RINGERS IV SOLN
INTRAVENOUS | Status: DC
  Administered 2013-05-03: 18:00:00 via INTRAVENOUS

## 2013-05-03 MED ORDER — INFLUENZA VAC SPLIT QUAD 0.5 ML IM SUSP: 0.5000 mL | INTRAMUSCULAR | Status: AC

## 2013-05-03 MED ORDER — DIPHENHYDRAMINE HCL 25 MG PO CAPS: 25.0000 mg | ORAL_CAPSULE | Freq: Four times a day (QID) | ORAL | Status: DC | PRN

## 2013-05-03 MED ORDER — ONDANSETRON HCL 4 MG PO TABS: 4.0000 mg | ORAL_TABLET | ORAL | Status: DC | PRN

## 2013-05-03 MED ORDER — MORPHINE SULFATE (PF) 0.5 MG/ML IJ SOLN
INTRAMUSCULAR | Status: DC | PRN
  Administered 2013-05-03: 4 mg via EPIDURAL

## 2013-05-03 MED ORDER — SCOPOLAMINE 1 MG/3DAYS TD PT72: 1.0000 | MEDICATED_PATCH | Freq: Once | TRANSDERMAL | Status: DC

## 2013-05-03 MED ORDER — SODIUM CHLORIDE 0.9 % IJ SOLN
3.0000 mL | INTRAMUSCULAR | Status: DC | PRN
  Administered 2013-05-03: 3 mL via INTRAVENOUS

## 2013-05-03 MED ORDER — CEFAZOLIN SODIUM-DEXTROSE 2-3 GM-% IV SOLR
INTRAVENOUS | Status: AC
  Filled 2013-05-03: qty 50

## 2013-05-03 MED ORDER — OXYCODONE-ACETAMINOPHEN 5-325 MG PO TABS
1.0000 | ORAL_TABLET | ORAL | Status: DC | PRN
  Administered 2013-05-04 – 2013-05-06 (×13): 2 via ORAL
  Filled 2013-05-03 (×14): qty 2

## 2013-05-03 SURGICAL SUPPLY — 34 items
BENZOIN TINCTURE PRP APPL 2/3 (GAUZE/BANDAGES/DRESSINGS) ×2 IMPLANT
CLAMP CORD UMBIL (MISCELLANEOUS) IMPLANT
CLOTH BEACON ORANGE TIMEOUT ST (SAFETY) ×2 IMPLANT
CONTAINER PREFILL 10% NBF 15ML (MISCELLANEOUS) IMPLANT
DRAPE LG THREE QUARTER DISP (DRAPES) ×2 IMPLANT
DRSG OPSITE POSTOP 4X10 (GAUZE/BANDAGES/DRESSINGS) ×2 IMPLANT
DURAPREP 26ML APPLICATOR (WOUND CARE) ×2 IMPLANT
ELECT REM PT RETURN 9FT ADLT (ELECTROSURGICAL) ×2
ELECTRODE REM PT RTRN 9FT ADLT (ELECTROSURGICAL) ×1 IMPLANT
EXTRACTOR VACUUM M CUP 4 TUBE (SUCTIONS) IMPLANT
GLOVE BIO SURGEON STRL SZ7.5 (GLOVE) ×2 IMPLANT
GLOVE BIOGEL PI IND STRL 7.5 (GLOVE) ×1 IMPLANT
GLOVE BIOGEL PI INDICATOR 7.5 (GLOVE) ×1
GLOVE SURG SS PI 7.0 STRL IVOR (GLOVE) ×4 IMPLANT
GOWN PREVENTION PLUS XLARGE (GOWN DISPOSABLE) ×2 IMPLANT
GOWN STRL REIN XL XLG (GOWN DISPOSABLE) ×2 IMPLANT
NS IRRIG 1000ML POUR BTL (IV SOLUTION) ×2 IMPLANT
PACK C SECTION WH (CUSTOM PROCEDURE TRAY) ×2 IMPLANT
PAD OB MATERNITY 4.3X12.25 (PERSONAL CARE ITEMS) ×2 IMPLANT
RETRACTOR WND ALEXIS 25 LRG (MISCELLANEOUS) ×1 IMPLANT
RTRCTR WOUND ALEXIS 25CM LRG (MISCELLANEOUS) ×2
STRIP CLOSURE SKIN 1/2X4 (GAUZE/BANDAGES/DRESSINGS) ×2 IMPLANT
SUT CHROMIC 2 0 CT 1 (SUTURE) ×2 IMPLANT
SUT MNCRL AB 3-0 PS2 27 (SUTURE) ×2 IMPLANT
SUT PLAIN 0 NONE (SUTURE) IMPLANT
SUT PLAIN 2 0 XLH (SUTURE) ×2 IMPLANT
SUT VIC AB 0 CT1 36 (SUTURE) ×2 IMPLANT
SUT VIC AB 0 CTX 36 (SUTURE) ×3
SUT VIC AB 0 CTX36XBRD ANBCTRL (SUTURE) ×3 IMPLANT
SUT VIC AB 2-0 SH 27 (SUTURE) ×2
SUT VIC AB 2-0 SH 27XBRD (SUTURE) ×2 IMPLANT
TOWEL OR 17X24 6PK STRL BLUE (TOWEL DISPOSABLE) ×2 IMPLANT
TRAY FOLEY CATH 14FR (SET/KITS/TRAYS/PACK) ×2 IMPLANT
WATER STERILE IRR 1000ML POUR (IV SOLUTION) ×2 IMPLANT

## 2013-05-03 NOTE — Anesthesia Postprocedure Evaluation (Addendum)
Anesthesia Post Note  Patient: Tina [name]  Procedure(s) Performed: Procedure(s) (LRB): REPEAT CESAREAN SECTION WITH POSS ABDOMINAL HYSTERECTOMY (N/A)  Anesthesia type: Epidural  Patient location: pacu  Post pain: Pain level controlled  Post assessment: Post-op Vital signs reviewed  Last Vitals:  Filed Vitals:   05/03/13 1719  BP: 127/84  Pulse: 69  Temp: 36.3 C  Resp: 16    Post vital signs: Reviewed  Level of consciousness: awake  Complications: No apparent anesthesia complications

## 2013-05-03 NOTE — H&P (Signed)
Tina [name] is a 32 y.o. female presenting for repeat c/s.  She arrived after getting her MRI at [facility] this morning which she did not go to last week as scheduled.    History OB History   Grav Para Term Preterm Abortions TAB SAB Ect Mult Living   7 5 5  0 1 0 1 0 0 4     Past Medical History  Diagnosis Date  . Gonorrhea   . Trichimoniasis   . Hx of chlamydia infection   . H/O bacterial infection   . H/O varicella   . Headache(784.0)     Frequent  . Acid reflux   . Postpartum depression   . Depression     NO MEDS  . Abnormal Pap smear     2003 ASC-US 2005 ASC-US LSIL high risk HPV;LAST PAP A WHILE AGO  . 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 H/O migraine   . Gestational diabetes 2008    NO MEDS;DIET CONTROLLED  . Gestational diabetes mellitus 2012    NO MEDS; DIET CONTROLLED  . 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 Status post cesarean delivery 04/23/2012   Past Surgical History  Procedure Laterality Date  . Cesarean section  2005    x4;FIRST CHILD IN FETAL DISTRESS  . Cesarean section  04/23/2012    Procedure: CESAREAN SECTION;  Surgeon: Purcell Nails, MD;  Location: [facility];  Service: Obstetrics;  Laterality: N/A;   Family History: family history includes Asthma in her brother; COPD in her brother and mother; Hypertension in her paternal grandfather and paternal grandmother; Other in her paternal aunt and paternal grandmother; Seizures in her cousin. There is no history of Anesthesia problems, Hypotension, Malignant hyperthermia, or Pseudochol deficiency. Social History:  reports that she has been smoking Cigarettes.  She has been smoking about 0.25 packs per day. She has never used smokeless tobacco. She reports that  drinks alcohol. She reports that she does not use illicit drugs.   Prenatal Transfer Tool  Maternal Diabetes: No Genetic Screening: Declined Maternal Ultrasounds/Referrals: Normal Fetal Ultrasounds or other Referrals:   None Maternal Substance Abuse:  Yes:  Type: Marijuana Significant Maternal Medications:  None Significant Maternal Lab Results:  Abnormal titers with Rh alloimmunization Other Comments:  MRI with no gross abnormality of percreta or accreta  ROS Non-contributory   Last menstrual period 08/21/2012. Exam Physical Exam   Lungs CTA CV RRR Abd gravid, NT Ext no calf tenderness  Prenatal labs: ABO, Rh: --/--/A NEG (09/08 1400) Antibody: POS (09/08 1400) Rubella:   RPR: NON REACTIVE (09/08 1357)  HBsAg:    HIV: Non-reactive (04/25 0000)  GBS:   neg  Assessment/Plan: Pt is G7 P5 with 5 prior c/s and anterior placenta with prelim read on MRI of no gross abnormality with the placena.  Will cross for 2units.  Pt continues to decline sterilization procedure and wants depo provera prior to discharge.  She is agreeable to c-hysterectomy if medically recommended.  The risks of the procedure which including but not limited to bleeding, infection and injury were discussed with the patient and consent signed and witnessed.  Pt has been followed by MFM for marginal growth and Rh alloimmunization.  She has had elevated titers and nl dopplers followed by MFM with rec for delivery between 37-38 wks.  Pt had previa early in pregnancy which has since resolved.  Purcell Nails 05/03/2013, 12:47 PM

## 2013-05-03 NOTE — Transfer of Care (Signed)
Immediate Anesthesia Transfer of Care Note  Patient: Tina [name]  Procedure(s) Performed: Procedure(s): REPEAT CESAREAN SECTION WITH POSS ABDOMINAL HYSTERECTOMY (N/A)  Patient Location: PACU  Anesthesia Type:Epidural  Level of Consciousness: sedated  Airway & Oxygen Therapy: Patient Spontanous Breathing  Post-op Assessment: Report given to PACU RN  Post vital signs: Reviewed and stable  Complications: No apparent anesthesia complications

## 2013-05-03 NOTE — Op Note (Addendum)
Cesarean Section Procedure Note  Indications: previous c-section x 5 for repeat c-section and declines sterilization procedure  Pre-operative Diagnosis: Prior Cesarean x 5   Post-operative Diagnosis: Prior Cesarean x 5  Procedure: REPEAT LOW TRANSVERSE CESAREAN SECTION  Surgeon: Purcell Nails, MD    Assistants: Silverio Lay, MD  Anesthesia: Regional  Anesthesiologist: Brayton Caves, MD  Procedure Details  The patient was taken to the operating room secondary to repeat c-section after the risks, benefits, complications, treatment options, and expected outcomes were discussed with the patient.  The patient concurred with the proposed plan, giving informed consent which was signed and witnessed. The patient was taken to Operating Room C-Section Suite, identified as [facility] and the procedure verified as C-Section Delivery. A Time Out was held and the above information confirmed.  After induction of anesthesia by obtaining a spinal, the patient was prepped and draped in the usual sterile manner. A Pfannenstiel skin incision was made and carried down through the subcutaneous tissue to the underlying layer of fascia.  The fascia was incised bilaterally and extended transversely bilaterally with the Mayo scissors. Kocher clamps were placed on the inferior aspect of the fascial incision and the underlying rectus muscle was separated from the fascia. The same was done on the superior aspect of the fascial incision.  The peritoneum was incidentally entered sharply and extended sharply and manually. An Alexis self-retaining retractor was placed.  Large window noted.  The utero-vesical peritoneal reflection was incised transversely and the bladder flap was bluntly freed from the lower uterine segment. A low transverse uterine incision was made with the scalpel and extended bilaterally with the bandage scissors.  The infant was delivered in vertex position without difficulty.  After the  umbilical cord was clamped and cut, the infant was handed to the awaiting pediatricians.  Cord blood was obtained for evaluation.  The placenta was removed intact and appeared to be within normal limits. The uterus was cleared of all clots and debris. The uterine incision was closed with running interlocking sutures of 0 Vicryl and a second imbricating layer was performed as well.   Several figure of eight stitches were placed for hemostasis.  Bilateral tubes and ovaries appeared to be within normal limits.  Good hemostasis was noted.  Copious irrigation was performed until clear.  A piece or surgicel was placed on the uterine incision to ensure hemostasis.  The peritoneum was repaired with 2-0 chromic via a running suture.  The fascia was reapproximated with a running suture of 0 Vicryl. The subcutaneous tissue was reapproximated with 3 interrupted sutures of 2-0 plain.  The skin was reapproximated with a subcuticular suture of 3-0 monocryl.  Steristrips were applied.  Instrument, sponge, and needle counts were correct prior to abdominal closure and at the conclusion of the case.  The patient was awaiting transfer to the recovery room in good condition.  Findings: Live female infant with Apgars 9 at one minute and 9 at five minutes.  Normal appearing bilateral ovaries and fallopian tubes were noted.  Estimated Blood Loss:  800 ml         Drains: foley to gravity with 400 ml         Total IV Fluids: 3000 ml         Specimens to Pathology: Placenta         Complications:  None; patient tolerated the procedure well.         Disposition: PACU - hemodynamically stable.  Condition: stable  Attending Attestation: I performed the procedure.

## 2013-05-03 NOTE — Anesthesia Preprocedure Evaluation (Addendum)
Anesthesia Evaluation  Patient identified by MRN, date of birth, ID band Patient awake    Reviewed: Allergy & Precautions, H&P , Patient's Chart, lab work & pertinent test results  Airway Mallampati: II TM Distance: >3 FB Neck ROM: full    Dental no notable dental hx.    Pulmonary neg pulmonary ROS,  breath sounds clear to auscultation  Pulmonary exam normal       Cardiovascular negative cardio ROS  Rhythm:regular Rate:Normal     Neuro/Psych  Headaches, PSYCHIATRIC DISORDERS negative neurological ROS  negative psych ROS   GI/Hepatic negative GI ROS, Neg liver ROS, GERD-  ,  Endo/Other  negative endocrine ROSdiabetes  Renal/GU negative Renal ROS     Musculoskeletal   Abdominal   Peds  Hematology negative hematology ROS (+)   Anesthesia Other Findings Question of accreta  Reproductive/Obstetrics (+) Pregnancy                           Anesthesia Physical Anesthesia Plan  ASA: III  Anesthesia Plan: Epidural   Post-op Pain Management:    Induction:   Airway Management Planned:   Additional Equipment:   Intra-op Plan:   Post-operative Plan:   Informed Consent: I have reviewed the patients History and Physical, chart, labs and discussed the procedure including the risks, benefits and alternatives for the proposed anesthesia with the patient or authorized representative who has indicated his/her understanding and acceptance.     Plan Discussed with:   Anesthesia Plan Comments:        2 large PIVs; T+C for 2 units in room Anesthesia Quick Evaluation

## 2013-05-03 NOTE — Anesthesia Procedure Notes (Signed)
Epidural Patient location during procedure: OR Start time: 05/03/2013 2:20 PM  Staffing Anesthesiologist: Angus Seller., Harrell Gave. Performed by: anesthesiologist   Preanesthetic Checklist Completed: patient identified, site marked, surgical consent, pre-op evaluation, timeout performed, IV checked, risks and benefits discussed and monitors and equipment checked  Epidural Patient position: sitting Prep: site prepped and draped and DuraPrep Patient monitoring: continuous pulse ox and blood pressure Approach: midline Injection technique: LOR air and LOR saline  Needle:  Needle type: Tuohy  Needle gauge: 17 G Needle length: 9 cm and 9 Needle insertion depth: 5 cm cm Catheter type: closed end flexible Catheter size: 19 Gauge Catheter at skin depth: 10 cm Test dose: negative  Assessment Events: blood not aspirated, injection not painful, no injection resistance, negative IV test and no paresthesia  Additional Notes Patient identified.  Risk benefits discussed including failed block, incomplete pain control, headache, nerve damage, paralysis, blood pressure changes, nausea, vomiting, reactions to medication both toxic or allergic, and postpartum back pain.  Patient expressed understanding and wished to proceed.  All questions were answered.  Sterile technique used throughout procedure and epidural site dressed with sterile barrier dressing. No paresthesia or other complications noted.The patient did not experience any signs of intravascular injection such as tinnitus or metallic taste in mouth nor signs of intrathecal spread such as rapid motor block. Please see nursing notes for vital signs.

## 2013-05-03 NOTE — Progress Notes (Signed)
Called to see patient on MBU due to patient's insistence on going outside to smoke. Refuses nicotine patch. Patient sitting up on side of bed, no syncope or dizziness.  Denies N/V. Has ambulated to BR independently. Foley still in place. Incisional dressing CDI Lochia WNL Fundus firm.  Reviewed issues with patient and family. Patient intends to smoke outside. Issue reviewed. Will allow wheelchair transport by family outside. Saline lock IV. Precautions reviewed with patient and family. Will have baby bathed, then do skin-to-skin for 1 hour, then may go on wheelchair excursion.  Nigel Bridgeman, CNM 05/03/13 9:25p

## 2013-05-04 ENCOUNTER — Encounter (HOSPITAL_COMMUNITY): Payer: Self-pay | Admitting: Anesthesiology

## 2013-05-04 ENCOUNTER — Encounter (HOSPITAL_COMMUNITY): Payer: Self-pay | Admitting: Obstetrics and Gynecology

## 2013-05-04 LAB — TYPE AND SCREEN
Antibody Screen: POSITIVE
Unit division: 0

## 2013-05-04 LAB — CBC
Platelets: 152 10*3/uL (ref 150–400)
RBC: 2.86 MIL/uL — ABNORMAL LOW (ref 3.87–5.11)
RDW: 13.4 % (ref 11.5–15.5)
WBC: 9.3 10*3/uL (ref 4.0–10.5)

## 2013-05-04 NOTE — Anesthesia Postprocedure Evaluation (Deleted)
  Anesthesia Post-op Note  Patient: Tina [name]  Procedure(s) Performed: * No procedures listed *  Patient Location: Mother/Baby  Anesthesia Type:Epidural  Level of Consciousness: awake, alert , oriented and patient cooperative  Airway and Oxygen Therapy: Patient Spontanous Breathing  Post-op Pain: mild  Post-op Assessment: Patient's Cardiovascular Status Stable, Respiratory Function Stable, Patent Airway, No signs of Nausea or vomiting, Adequate PO intake and Pain level controlled  Post-op Vital Signs: Reviewed and stable  Complications: No apparent anesthesia complications

## 2013-05-04 NOTE — Progress Notes (Signed)
Subjective: Postpartum Day 1: Cesarean Delivery Patient reports tolerating PO, + flatus and no problems voiding.  Ambulating without difficulty with no lightheadedness or dizziness.  She continues to want to go outside for smoke.  Objective: Vital signs in last 24 hours: Temp:  [97 F (36.1 C)-98.4 F (36.9 C)] 97.9 F (36.6 C) (09/10 0950) Pulse Rate:  [68-90] 81 (09/10 0950) Resp:  [14-20] 20 (09/10 0950) BP: (108-141)/(60-93) 118/84 mmHg (09/10 0950) SpO2:  [98 %-100 %] 99 % (09/10 0950) Weight:  [72.122 kg (159 lb)] 72.122 kg (159 lb) (09/09 1732)  Physical Exam:  General: alert and no distress Lochia: appropriate Uterine Fundus: firm Incision: healing well, dressing c/d/i DVT Evaluation: No evidence of DVT seen on physical exam.   Recent Labs  05/02/13 1357 05/04/13 0550  HGB 10.4* 8.6*  HCT 31.5* 25.6*    Assessment/Plan: Status post Cesarean section. Doing well postoperatively.  Continue current care. Pt with Rh alloimmunization - rhogam not required Pt will likely want to be discharged tomorrow  Purcell Nails 05/04/2013, 10:10 AM

## 2013-05-04 NOTE — Clinical Social Work Maternal (Signed)
    Clinical Social Work Department PSYCHOSOCIAL ASSESSMENT - MATERNAL/CHILD 05/04/2013  Patient:  Tina [name], Tina [name]  Account Number:  [id number]  Admit Date:  05/03/2013  Marjo Bicker Name:   Tina [name]    Clinical Social Worker:  Nobie Putnam, LCSW   Date/Time:  05/04/2013 12:31 PM  Date Referred:  05/04/2013   Referral source  CN     Referred reason  Substance Abuse  Depression/Anxiety   Other referral source:    I:  FAMILY / HOME ENVIRONMENT Child's legal guardian:  PARENT  Guardian - Name Guardian - Age Guardian - Address  Tina [name] [id number] [place], [place] [address]   Other household support members/support persons Name Relationship DOB   DAUGHTER 72 years old   SON 6 years  old   DAUGHTER 43 years old   SON 3 years old   DAUGHTER 47 year old   Other support:    II  PSYCHOSOCIAL DATA Information Source:  Patient Interview  Event organiser Employment:   Surveyor, quantity resources:  [employer] If Medicaid - County:  [place] Other  [facility]  Food Stamps   School / Grade:   Maternity Care Coordinator / Child Services Coordination / Early Interventions:  Cultural issues impacting care:    III  STRENGTHS Strengths  Home prepared for Child (including basic supplies)  Supportive family/friends   Strength comment:    IV  RISK FACTORS AND CURRENT PROBLEMS Current Problem:  YES   Risk Factor & Current Problem Patient Issue Family Issue Risk Factor / Current Problem Comment  Substance Abuse Y N Hx of MJ & Etoh use  Mental Illness Y N Hx of PP depression  DSS Involvement Y N Open case    V  SOCIAL WORK ASSESSMENT CSW met with pt to assess history of MJ use & PP depression.  Pt admits to smoking MJ "everyday," prior to pregnancy confirmation at 4 months.  Once pregnancy was confirmed, she stopped smoking MJ.  She denies any Etoh during the pregnancy.  UDS & meconium collection pending. CSW inquired about pt's experience with PP  depression however she denied history.  No history of SI.  Pt asked CSW to baby "accessories"  & formula.  Pt does not have appropriate sleeping arrangements for the infant.  CSW discussed the increased risk of SIDS when co-sleeping occurs & strongly encouraged her to make other arrangements.  CSW provided pt with a bundle pack of clothing.  She has a car seat.  Pt told CSW that she has an open CPS case with Tina [name].  CSW spoke with Mr. Tina [name], who does not have any concerns about this pt discharging with infant.  Mr. Tina [name] states he will try to gather some additional supplies, including a crib or bassinet, for the pt & continue to follow her once discharged.  CSW will monitor drug screen results & make a referral if needed.      VI SOCIAL WORK PLAN Social Work Plan  No Further Intervention Required / No Barriers to Discharge   Type of pt/family education:   If child protective services report - county:   If child protective services report - date:   Information/referral to community resources comment:   Other social work plan:

## 2013-05-04 NOTE — Progress Notes (Signed)
Ur chart review completed.  

## 2013-05-04 NOTE — Anesthesia Postprocedure Evaluation (Signed)
  Anesthesia Post-op Note  Patient: Tina [name]  Procedure(s) Performed: Procedure(s): REPEAT CESAREAN SECTION WITH POSS ABDOMINAL HYSTERECTOMY (N/A)  Patient Location: Mother/Baby  Anesthesia Type:Epidural  Level of Consciousness: awake, alert , oriented and patient cooperative  Airway and Oxygen Therapy: Patient Spontanous Breathing  Post-op Pain: mild  Post-op Assessment: Patient's Cardiovascular Status Stable, Respiratory Function Stable, Patent Airway, No signs of Nausea or vomiting, Adequate PO intake and Pain level controlled  Post-op Vital Signs: Reviewed and stable  Complications: No apparent anesthesia complications

## 2013-05-05 MED ORDER — FERROUS SULFATE 325 (65 FE) MG PO TABS: 325.0000 mg | ORAL_TABLET | Freq: Three times a day (TID) | ORAL | Status: DC

## 2013-05-05 MED ORDER — OXYCODONE-ACETAMINOPHEN 5-325 MG PO TABS: 1.0000 | ORAL_TABLET | ORAL | Status: DC | PRN

## 2013-05-05 MED ORDER — MEDROXYPROGESTERONE ACETATE 150 MG/ML IM SUSP
150.0000 mg | Freq: Once | INTRAMUSCULAR | Status: DC
  Filled 2013-05-05: qty 1

## 2013-05-05 MED ORDER — IBUPROFEN 800 MG PO TABS: 800.0000 mg | ORAL_TABLET | Freq: Three times a day (TID) | ORAL | Status: DC | PRN

## 2013-05-05 NOTE — Discharge Summary (Addendum)
  Cesarean Section Delivery Discharge Summary  Tina [name]  DOB:    06/24/1981 MRN:    [id number] CSN:    [id number]  Date of admission:                  05/03/2013  Date of discharge:                   05/05/2013  Procedures this admission:  Date of Delivery: 05/03/2013  -  repeat low transverse cesarean section by Dr. Osborn [name]  Newborn Data:  Live born female  Birth Weight: 5 lb 4.7 oz (2400 g) APGAR: 9, 9  Home with mother. Name: Tina [name] Circumcision Plan: Outpatient  History of Present Illness:  Tina [name] is a 32 y.o. female, [id number], who presents at [redacted]w[redacted]d weeks gestation. The patient has been followed at the [facility] and Gynecology division of [facility].    Her pregnancy has been complicated by: Late prenatal care, 5 prior cesarean deliveries, positive antibody screen for Rh D., marijuana use during pregnancy, anemia, noncompliance.  Patient Active Problem List   Diagnosis Date Noted  . Rh negative state in antepartum period 02/05/2012  . Cannabis abuse 03/21/2011  . Smoker 03/21/2011   none.  Hospital course:  The patient was admitted for a repeat cesarean delivery.   She tolerated her surgery well with operative findings including normal tubes and ovaries and pelvic adhesions. Her postpartum course was complicated. She was noted to have a hemoglobin of 8.6. She was not orthostatic. She declined blood transfusion after the risk and benefits were discussed. She was given Depo-Provera for contraception prior to discharge. She was discharged to home on postpartum day 2 doing well.  Feeding:  breast and bottle  Contraception:  Depo-Provera  Discharge hemoglobin:  Hemoglobin  Date Value Range Status  05/04/2013 8.6* 12.0 - 15.0 g/dL Final     DELTA CHECK NOTED     REPEATED TO VERIFY     HCT  Date Value Range Status  05/04/2013 25.6* 36.0 - 46.0 % Final    Discharge Physical Exam:   General: no  distress Lochia: appropriate Uterine Fundus: firm Incision: healing well DVT Evaluation: No evidence of DVT seen on physical exam.  Intrapartum Procedures: cesarean: low cervical, transverse Postpartum Procedures: none Complications-Operative and Postpartum: none  Discharge Diagnoses: Term Pregnancy-delivered and Prior cesarean sections, noncompliance, late for prenatal care, anemia, marijuana use during pregnancy, Rh D. isoimmunization.  Discharge Information:  Activity:           pelvic rest Diet:                routine Medications: PNV, Ibuprofen, Iron and Percocet Condition:      stable and improved Instructions:  Postpartum blues and cesarean section instructions Discharge to: home  Follow-up Information   Follow up with Tina Nails, MD In 6 weeks.   Specialty:  Obstetrics and Gynecology   Contact information:   [address] [place] [address] [phone]        Tina [name] 05/05/2013

## 2013-05-05 NOTE — Lactation Note (Signed)
This note was copied from the chart of Tina Bayla Mcgovern. Lactation Consultation Note   Follow up consult with this mom and baby, who are now 46 hours post partum. Baby was asleep in crib, and mom getting ready to eat. Mom states she will breast feed "a little bit"", and feels her milk is coming in, and will call when she wants help with latching. This is mom's 5th baby.   Patient Name: Tina [name] [id number] Date: 05/05/2013 Reason for consult: Follow-up assessment   Maternal Data    Feeding Feeding Type: Formula Nipple Type: Slow - flow  LATCH Score/Interventions                      Lactation Tools Discussed/Used     Consult Status Consult Status: Follow-up Follow-up type: Call as needed    Alfred Levins 05/05/2013, 12:54 PM

## 2013-05-06 MED ORDER — IBUPROFEN 600 MG PO TABS: 600.0000 mg | ORAL_TABLET | Freq: Four times a day (QID) | ORAL | Status: DC | PRN

## 2013-05-06 MED ORDER — MEDROXYPROGESTERONE ACETATE 150 MG/ML IM SUSP: 150.0000 mg | Freq: Once | INTRAMUSCULAR | Status: DC

## 2013-05-06 MED ORDER — OXYCODONE-ACETAMINOPHEN 5-325 MG PO TABS: 1.0000 | ORAL_TABLET | Freq: Four times a day (QID) | ORAL | Status: DC | PRN

## 2013-05-06 NOTE — Discharge Summary (Signed)
Obstetric Discharge Summary Reason for Admission: cesarean section Prenatal Procedures: ultrasound Intrapartum Procedures: Repeat LTCS and declined sterilization Postpartum Procedures: none Complications-Operative and Postpartum: none Hemoglobin  Date Value Range Status  05/04/2013 8.6* 12.0 - 15.0 g/dL Final     DELTA CHECK NOTED     REPEATED TO VERIFY     HCT  Date Value Range Status  05/04/2013 25.6* 36.0 - 46.0 % Final   Pt has no complaints.  She is bottlefeeding.  Plans depo provera for BC.  Physical Exam:  General: alert and no distress Lochia: appropriate Uterine Fundus: firm Incision: healing well, dressing c/d/i DVT Evaluation: No evidence of DVT seen on physical exam.  Discharge Diagnoses: Term Pregnancy-delivered  Discharge Information: Date: 05/06/2013 Activity: pelvic rest Diet: routine Medications: Ibuprofen, Percocet and depo provera Condition: stable Instructions: refer to practice specific booklet Discharge to: home Follow-up Information   Follow up with Purcell Nails, MD In 6 weeks. (for post partum visit)    Specialty:  Obstetrics and Gynecology   Contact information:   [address] [place] [address] [phone]       Newborn Data: Live born female  Birth Weight: 5 lb 4.7 oz (2400 g) APGAR: 9, 9  Home with mother.  Corneshia Hines Y 05/06/2013, 10:34 AM

## 2013-05-10 ENCOUNTER — Other Ambulatory Visit (HOSPITAL_COMMUNITY): Payer: Medicaid Other

## 2014-03-20 ENCOUNTER — Emergency Department (HOSPITAL_COMMUNITY)
Admission: EM | Admit: 2014-03-20 | Discharge: 2014-03-20 | Disposition: A | Discharge status: Home / Self Care | Payer: Medicaid Other | Attending: Emergency Medicine | Admitting: Emergency Medicine

## 2014-03-20 ENCOUNTER — Encounter (HOSPITAL_COMMUNITY): Payer: Self-pay | Admitting: Emergency Medicine

## 2014-03-20 DIAGNOSIS — Z8659 Personal history of other mental and behavioral disorders: Secondary | ICD-10-CM | POA: Insufficient documentation

## 2014-03-20 DIAGNOSIS — Z8632 Personal history of gestational diabetes: Secondary | ICD-10-CM | POA: Insufficient documentation

## 2014-03-20 DIAGNOSIS — Z202 Contact with and (suspected) exposure to infections with a predominantly sexual mode of transmission: Secondary | ICD-10-CM

## 2014-03-20 DIAGNOSIS — B86 Scabies: Secondary | ICD-10-CM

## 2014-03-20 DIAGNOSIS — Z3202 Encounter for pregnancy test, result negative: Secondary | ICD-10-CM

## 2014-03-20 DIAGNOSIS — R21 Rash and other nonspecific skin eruption: Secondary | ICD-10-CM | POA: Insufficient documentation

## 2014-03-20 DIAGNOSIS — Z8719 Personal history of other diseases of the digestive system: Secondary | ICD-10-CM | POA: Insufficient documentation

## 2014-03-20 DIAGNOSIS — F17200 Nicotine dependence, unspecified, uncomplicated: Secondary | ICD-10-CM | POA: Insufficient documentation

## 2014-03-20 DIAGNOSIS — Z8669 Personal history of other diseases of the nervous system and sense organs: Secondary | ICD-10-CM | POA: Insufficient documentation

## 2014-03-20 DIAGNOSIS — Z8619 Personal history of other infectious and parasitic diseases: Secondary | ICD-10-CM | POA: Insufficient documentation

## 2014-03-20 LAB — POC URINE PREG, ED: PREG TEST UR: NEGATIVE

## 2014-03-20 MED ORDER — LIDOCAINE HCL (PF) 1 % IJ SOLN
5.0000 mL | Freq: Once | INTRAMUSCULAR | Status: AC
  Administered 2014-03-20: 5 mL
  Filled 2014-03-20: qty 5

## 2014-03-20 MED ORDER — AZITHROMYCIN 250 MG PO TABS
1000.0000 mg | ORAL_TABLET | Freq: Once | ORAL | Status: AC
  Administered 2014-03-20: 1000 mg via ORAL
  Filled 2014-03-20: qty 4

## 2014-03-20 MED ORDER — PERMETHRIN 5 % EX CREA: TOPICAL_CREAM | CUTANEOUS | Status: DC

## 2014-03-20 MED ORDER — CEFTRIAXONE SODIUM 250 MG IJ SOLR
250.0000 mg | Freq: Once | INTRAMUSCULAR | Status: AC
  Administered 2014-03-20: 250 mg via INTRAMUSCULAR
  Filled 2014-03-20: qty 250

## 2014-03-20 NOTE — ED Notes (Signed)
The patient said she has had a rash for a couple of weeks.  She said she has been itching.  She did say she had bed bugs but she has washed everything and thrown her mattress out.  She is here to be evaluated.

## 2014-03-20 NOTE — ED Notes (Signed)
Declined W/C at D/C and was escorted to lobby by RN. 

## 2014-03-20 NOTE — Discharge Instructions (Signed)
1. Medications: permetherin, usual home medications 2. Treatment: rest, drink plenty of fluids,  3. Follow Up: Please followup with your primary doctor in 3 days for complete STD testing for discussion of your diagnoses and further evaluation after today's visit; if you do not have a primary care doctor use the resource guide provided to find one;    Scabies Scabies are small bugs (mites) that burrow under the skin and cause red bumps and severe itching. These bugs can only be seen with a microscope. Scabies are highly contagious. They can spread easily from person to person by direct contact. They are also spread through sharing clothing or linens that have the scabies mites living in them. It is not unusual for an entire family to become infected through shared towels, clothing, or bedding.  HOME CARE INSTRUCTIONS   Your caregiver may prescribe a cream or lotion to kill the mites. If cream is prescribed, massage the cream into the entire body from the neck to the bottom of both feet. Also massage the cream into the scalp and face if your child is less than 58 year old. Avoid the eyes and mouth. Do not wash your hands after appli22cation.  Leave the cream on for 8 to 12 hours. Your child should bathe or shower after the 8 to 12 hour application period. Sometimes it is helpful to apply the cream to your child right before bedtime.  One treatment is usually effective and will eliminate approximately 95% of infestations. For severe cases, your caregiver may decide to repeat the treatment in 1 week. Everyone in your household should be treated with one application of the cream.  New rashes or burrows should not appear within 24 to 48 hours after successful treatment. However, the itching and rash may last for 2 to 4 weeks after successful treatment. Your caregiver may prescribe a medicine to help with the itching or to help the rash go away more quickly.  Scabies can live on clothing or linens for up to 3  days. All of your child's recently used clothing, towels, stuffed toys, and bed linens should be washed in hot water and then dried in a dryer for at least 20 minutes on high heat. Items that cannot be washed should be enclosed in a plastic bag for at least 3 days.  To help relieve itching, bathe your child in a cool bath or apply cool washcloths to the affected areas.  Your child may return to school after treatment with the prescribed cream. SEEK MEDICAL CARE IF:   The itching persists longer than 4 weeks after treatment.  The rash spreads or becomes infected. Signs of infection include red blisters or yellow-tan crust. Document Released: 08/11/2005 Document Revised: 11/03/2011 Document Reviewed: 12/20/2008 [facility] Patient Information 2015 [employer], [place]. This information is not intended to replace advice given to you by your health care provider. Make sure you discuss any questions you have with your health care provider.

## 2014-03-20 NOTE — ED Provider Notes (Signed)
CSN: [id number]     Arrival date & time 03/20/14  1647 History  This chart was scribed for non-physician provider [name], PA-C, working with Gerhard Munch, MD by [name], ED Scribe. This patient was seen in room TR08C/TR08C and patient care was started at 5:39 PM.     Chief Complaint  Patient presents with  . Rash    The patient said she has had a rash for a couple of weeks.  She said she has been itching.  She did say she had bed bugs but she has washed everything and thrown her mattress out.   The history is provided by the patient and medical records. No language interpreter was used.   HPI Comments: Tina [name] is a 33 y.o. female who presents to the Emergency Department complaining of a rash onset one month ago. Patient reports bloody itchy bumps everywhere. Patient reports a history of bedbugs. She denies taking anything or trying anything to relieve the itching and pain. Patient requests STD/HIV tests and pregnancy test. Medical records show that patient's significant other has chlamydia but patient states that she has not seen any symptoms. She denies vaginal discharge, pain, dysuria but reports a slight foul odor. She believes that she may be pregnant and states that her last menstrual cycle was last month.     Past Medical History  Diagnosis Date  . Gonorrhea   . Trichimoniasis   . Hx of chlamydia infection   . H/O bacterial infection   . H/O varicella   . Headache(784.0)     Frequent  . Acid reflux   . Postpartum depression   . Depression     NO MEDS  . Abnormal Pap smear     2003 ASC-US 2005 ASC-US LSIL high risk HPV;LAST PAP A WHILE AGO  . 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 H/O migraine   . Gestational diabetes 2008    NO MEDS;DIET CONTROLLED  . Gestational diabetes mellitus 2012    NO MEDS; DIET CONTROLLED  . 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 Status post cesarean delivery 04/23/2012   Past Surgical History   Procedure Laterality Date  . Cesarean section  2005    x4;FIRST CHILD IN FETAL DISTRESS  . Cesarean section  04/23/2012    Procedure: CESAREAN SECTION;  Surgeon: Purcell Nails, MD;  Location: [facility] ORS;  Service: Obstetrics;  Laterality: N/A;  . Cesarean section N/A 05/03/2013    Procedure: REPEAT CESAREAN SECTION WITH POSS ABDOMINAL HYSTERECTOMY;  Surgeon: Purcell Nails, MD;  Location: [facility] ORS;  Service: Obstetrics;  Laterality: N/A;   Family History  Problem Relation Age of Onset  . Hypertension Paternal Grandfather   . Anesthesia problems Neg Hx   . Hypotension Neg Hx   . Malignant hyperthermia Neg Hx   . Pseudochol deficiency Neg Hx   . COPD Mother     Bronchitis  . Asthma Brother   . COPD Brother     Bronchitis  . Hypertension Paternal Grandmother   . Other Paternal Grandmother     SPIDER VEINS  . Other Paternal Aunt     SPIDER VEINS  . Seizures Cousin     PATERNAL   History  Substance Use Topics  . Smoking status: Current Every Day Smoker -- 0.25 packs/day    Types: Cigarettes  . Smokeless tobacco: Never Used  . Alcohol Use: Yes     Comment: occasional   OB History   Grav Para Term Preterm  Abortions TAB SAB Ect Mult Living   6 5 5  0 1 0 1 0 0 4     Review of Systems  Constitutional: Negative for fever, diaphoresis, appetite change, fatigue and unexpected weight change.  HENT: Negative for mouth sores.   Eyes: Negative for visual disturbance.  Respiratory: Negative for cough, chest tightness, shortness of breath and wheezing.   Cardiovascular: Negative for chest pain.  Gastrointestinal: Negative for nausea, vomiting, abdominal pain, diarrhea and constipation.  Endocrine: Negative for polydipsia, polyphagia and polyuria.  Genitourinary: Negative for dysuria, urgency, frequency and hematuria.  Musculoskeletal: Negative for back pain and neck stiffness.  Skin: Positive for rash.  Allergic/Immunologic: Negative for immunocompromised state.  Neurological:  Negative for syncope, light-headedness and headaches.  Hematological: Does not bruise/bleed easily.  Psychiatric/Behavioral: Negative for sleep disturbance. The patient is not nervous/anxious.    Allergies  Review of patient's allergies indicates no known allergies.  Home Medications   Prior to Admission medications   Medication Sig Start Date End Date Taking? Authorizing Provider  permethrin (ELIMITE) 5 % cream Apply to affected area once 03/20/14   Ladarrious Kirksey, PA-C   BP 133/74  Pulse 74  Temp(Src) 98.3 F (36.8 C) (Oral)  Resp 18  SpO2 99% Physical Exam  Nursing note and vitals reviewed. Constitutional: She appears well-developed and well-nourished. No distress.  Awake, alert, nontoxic appearance  HENT:  Head: Normocephalic and atraumatic.  Mouth/Throat: Oropharynx is clear and moist. No oropharyngeal exudate.  Eyes: Conjunctivae are normal. No scleral icterus.  Neck: Normal range of motion. Neck supple.  Cardiovascular: Normal rate, regular rhythm and intact distal pulses.   Pulmonary/Chest: Effort normal and breath sounds normal. No respiratory distress. She has no wheezes.  Abdominal: Soft. Bowel sounds are normal. She exhibits no mass. There is no tenderness. There is no rebound and no guarding.  Musculoskeletal: Normal range of motion. She exhibits no edema.  Neurological: She is alert.  Speech is clear and goal oriented Moves extremities without ataxia  Skin: Skin is warm and dry. Rash noted. She is not diaphoretic.  Raised, erythematous, papular rash noted over the majority of the body, worse in the flexural folds of the arms legs and noted to the webs of fingers and toes. Significant excoriations throughout. No induration or evidence of secondary infection.  Psychiatric: She has a normal mood and affect.    ED Course  Procedures (including critical care time) DIAGNOSTIC STUDIES: Oxygen Saturation is 99% on room air, normal by my interpretation.     COORDINATION OF CARE: 5:41 PM-Discussed treatment plan which includes Prometharyn and hydrocortisone cream and use of Rid on fabrics with pt at bedside and pt agreed to plan.   Labs Review Labs Reviewed  POC URINE PREG, ED    Imaging Review No results found.   EKG Interpretation None      MDM   Final diagnoses:  STD exposure  Scabies  Pregnancy test negative    Britani Q Prust resents with complaints of a rash or request for STD testing. Patient denies vaginal symptoms including abdominal pain, vaginal discharge or vaginal bleeding and dysuria.  Patient also requests a pregnancy test.  Patient is restless consistent with scabies and her significant other has the identical rash. Discussed diagnosis & treatment of scabies with patient.  They have been advised to followup with her primary care doctor 2 weeks after treatment.  They have also been advised to clean entire household including washing sheets and using R.I.D. spray in the car  and on sofa.  The use of permethrin cream was discussed as well, they were told to use cream from head to toe & leave on child for 8-12 hours.  They've been advised to repeat treatment if new eruptions occur. Patient verbalized understanding.   Pregnancy test is negative. Patient's been treated for gonorrhea and chlamydia as her partner was positive for Chlamydia. I recommended that she followup with her primary care physician within 3 days for complete STD testing as she is asymptomatic at this time.  I have personally reviewed patient's vitals, nursing note and any pertinent labs or imaging.  I performed an undressed physical exam.    At this time, it has been determined that no acute conditions requiring further emergency intervention. The patient/guardian have been advised of the diagnosis and plan. I reviewed all labs and imaging including any potential incidental findings. We have discussed signs and symptoms that warrant return to the ED, such  as abdominal pain, intractable vomiting, high fevers, evidence of secondary infection.  Patient/guardian has voiced understanding and agreed to follow-up with the PCP or specialist in 3 days.  Vital signs are stable at discharge.   BP 133/74  Pulse 74  Temp(Src) 98.3 F (36.8 C) (Oral)  Resp 18  SpO2 99%   I personally performed the services described in this documentation, which was scribed in my presence. The recorded information has been reviewed and is accurate.    Dahlia ClientHannah Laelia Angelo, PA-C 03/20/14 1838

## 2014-03-21 NOTE — ED Provider Notes (Signed)
  Medical screening examination/treatment/procedure(s) were performed by non-physician practitioner and as supervising physician I was immediately available for consultation/collaboration.   EKG Interpretation None         Gerhard Munchobert Chen Holzman, MD 03/21/14 [phone]

## 2014-04-02 ENCOUNTER — Emergency Department (HOSPITAL_COMMUNITY)
Admission: EM | Admit: 2014-04-02 | Discharge: 2014-04-02 | Disposition: A | Discharge status: Home / Self Care | Payer: Medicaid Other | Attending: Emergency Medicine | Admitting: Emergency Medicine

## 2014-04-02 ENCOUNTER — Encounter (HOSPITAL_COMMUNITY): Payer: Self-pay | Admitting: Emergency Medicine

## 2014-04-02 DIAGNOSIS — B9689 Other specified bacterial agents as the cause of diseases classified elsewhere: Secondary | ICD-10-CM | POA: Insufficient documentation

## 2014-04-02 DIAGNOSIS — Z8719 Personal history of other diseases of the digestive system: Secondary | ICD-10-CM | POA: Insufficient documentation

## 2014-04-02 DIAGNOSIS — Z8659 Personal history of other mental and behavioral disorders: Secondary | ICD-10-CM | POA: Insufficient documentation

## 2014-04-02 DIAGNOSIS — N760 Acute vaginitis: Secondary | ICD-10-CM | POA: Insufficient documentation

## 2014-04-02 DIAGNOSIS — F17200 Nicotine dependence, unspecified, uncomplicated: Secondary | ICD-10-CM | POA: Insufficient documentation

## 2014-04-02 DIAGNOSIS — Z8669 Personal history of other diseases of the nervous system and sense organs: Secondary | ICD-10-CM | POA: Insufficient documentation

## 2014-04-02 DIAGNOSIS — Z3202 Encounter for pregnancy test, result negative: Secondary | ICD-10-CM | POA: Insufficient documentation

## 2014-04-02 DIAGNOSIS — Z202 Contact with and (suspected) exposure to infections with a predominantly sexual mode of transmission: Secondary | ICD-10-CM | POA: Insufficient documentation

## 2014-04-02 DIAGNOSIS — A499 Bacterial infection, unspecified: Secondary | ICD-10-CM | POA: Insufficient documentation

## 2014-04-02 DIAGNOSIS — Z8619 Personal history of other infectious and parasitic diseases: Secondary | ICD-10-CM | POA: Insufficient documentation

## 2014-04-02 DIAGNOSIS — R21 Rash and other nonspecific skin eruption: Secondary | ICD-10-CM | POA: Insufficient documentation

## 2014-04-02 DIAGNOSIS — B86 Scabies: Secondary | ICD-10-CM | POA: Insufficient documentation

## 2014-04-02 LAB — WET PREP, GENITAL
Trich, Wet Prep: NONE SEEN
Yeast Wet Prep HPF POC: NONE SEEN

## 2014-04-02 LAB — POC URINE PREG, ED: Preg Test, Ur: NEGATIVE

## 2014-04-02 MED ORDER — PERMETHRIN 5 % EX CREA: TOPICAL_CREAM | CUTANEOUS | Status: DC

## 2014-04-02 MED ORDER — METRONIDAZOLE 500 MG PO TABS: 500.0000 mg | ORAL_TABLET | Freq: Two times a day (BID) | ORAL | Status: DC

## 2014-04-02 NOTE — ED Notes (Signed)
Patient ran out of room to tell her ride not to leave while EDP at bedside.  Patient returned asked to speak with Doctor. Explained will get Doctor stated I am in a hurry and I am going to leave. Patient left without discharge instructions.

## 2014-04-02 NOTE — ED Notes (Signed)
Called patient and instructed her discharge paper work and prescriptions will be at the [facility]. EDP notified.

## 2014-04-02 NOTE — ED Notes (Signed)
Pt remains unable to provide a urine sample

## 2014-04-02 NOTE — Discharge Instructions (Signed)
Bacterial Vaginosis Bacterial vaginosis is a vaginal infection that occurs when the normal balance of bacteria in the vagina is disrupted. It results from an overgrowth of certain bacteria. This is the most common vaginal infection in women of childbearing age. Treatment is important to prevent complications, especially in pregnant women, as it can cause a premature delivery. CAUSES  Bacterial vaginosis is caused by an increase in harmful bacteria that are normally present in smaller amounts in the vagina. Several different kinds of bacteria can cause bacterial vaginosis. However, the reason that the condition develops is not fully understood. RISK FACTORS Certain activities or behaviors can put you at an increased risk of developing bacterial vaginosis, including:  Having a new sex partner or multiple sex partners.  Douching.  Using an intrauterine device (IUD) for contraception. Women do not get bacterial vaginosis from toilet seats, bedding, swimming pools, or contact with objects around them. SIGNS AND SYMPTOMS  Some women with bacterial vaginosis have no signs or symptoms. Common symptoms include:  Grey vaginal discharge.  A fishlike odor with discharge, especially after sexual intercourse.  Itching or burning of the vagina and vulva.  Burning or pain with urination. DIAGNOSIS  Your health care provider will take a medical history and examine the vagina for signs of bacterial vaginosis. A sample of vaginal fluid may be taken. Your health care provider will look at this sample under a microscope to check for bacteria and abnormal cells. A vaginal pH test may also be done.  TREATMENT  Bacterial vaginosis may be treated with antibiotic medicines. These may be given in the form of a pill or a vaginal cream. A second round of antibiotics may be prescribed if the condition comes back after treatment.  HOME CARE INSTRUCTIONS   Only take over-the-counter or prescription medicines as  directed by your health care provider.  If antibiotic medicine was prescribed, take it as directed. Make sure you finish it even if you start to feel better.  Do not have sex until treatment is completed.  Tell all sexual partners that you have a vaginal infection. They should see their health care provider and be treated if they have problems, such as a mild rash or itching.  Practice safe sex by using condoms and only having one sex partner. SEEK MEDICAL CARE IF:   Your symptoms are not improving after 3 days of treatment.  You have increased discharge or pain.  You have a fever. MAKE SURE YOU:   Understand these instructions.  Will watch your condition.  Will get help right away if you are not doing well or get worse. FOR MORE INFORMATION  Centers for Disease Control and Prevention, Division of STD Prevention: www.cdc.gov/std American Sexual Health Association (ASHA): www.ashastd.org  Document Released: 08/11/2005 Document Revised: 06/01/2013 Document Reviewed: 03/23/2013 ExitCare Patient Information 2015 [employer]. This information is not intended to replace advice given to you by your health care provider. Make sure you discuss any questions you have with your health care provider.  

## 2014-04-02 NOTE — ED Notes (Signed)
Patient stated that she is unable to urinate for POCT Pregnancy test right now.

## 2014-04-02 NOTE — ED Provider Notes (Signed)
CSN: [id number]     Arrival date & time 04/02/14  1550 History   First MD Initiated Contact with Patient 04/02/14 1629     Chief Complaint  Patient presents with  . Rash  . Exposure to STD     (Consider location/radiation/quality/duration/timing/severity/associated sxs/prior Treatment) Patient is a 33 y.o. female presenting with rash and STD exposure. The history is provided by the patient.  Rash Location:  Full body Quality: itchiness   Severity:  Mild Onset quality:  Gradual Timing:  Constant Progression:  Worsening Chronicity:  Recurrent Context comment:  Recently treated for scabies Relieved by:  Nothing Worsened by:  Nothing tried Ineffective treatments: one tx of permethrin. Associated symptoms: no abdominal pain, no diarrhea, no fatigue, no fever, no headaches, no nausea, no shortness of breath and not vomiting   Exposure to STD Pertinent negatives include no chest pain, no abdominal pain, no headaches and no shortness of breath.    Past Medical History  Diagnosis Date  . Gonorrhea   . Trichimoniasis   . Hx of chlamydia infection   . H/O bacterial infection   . H/O varicella   . Headache(784.0)     Frequent  . Acid reflux   . Postpartum depression   . Depression     NO MEDS  . Abnormal Pap smear     2003 ASC-US 2005 ASC-US LSIL high risk HPV;LAST PAP A WHILE AGO  . 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 H/O migraine   . Gestational diabetes 2008    NO MEDS;DIET CONTROLLED  . Gestational diabetes mellitus 2012    NO MEDS; DIET CONTROLLED  . 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 Status post cesarean delivery 04/23/2012   Past Surgical History  Procedure Laterality Date  . Cesarean section  2005    x4;FIRST CHILD IN FETAL DISTRESS  . Cesarean section  04/23/2012    Procedure: CESAREAN SECTION;  Surgeon: Purcell NailsAngela Y Roberts, MD;  Location: [facility];  Service: Obstetrics;  Laterality: N/A;  . Cesarean section N/A 05/03/2013    Procedure:  REPEAT CESAREAN SECTION WITH POSS ABDOMINAL HYSTERECTOMY;  Surgeon: Purcell NailsAngela Y Roberts, MD;  Location: [facility];  Service: Obstetrics;  Laterality: N/A;   Family History  Problem Relation Age of Onset  . Hypertension Paternal Grandfather   . Anesthesia problems Neg Hx   . Hypotension Neg Hx   . Malignant hyperthermia Neg Hx   . Pseudochol deficiency Neg Hx   . COPD Mother     Bronchitis  . Asthma Brother   . COPD Brother     Bronchitis  . Hypertension Paternal Grandmother   . Other Paternal Grandmother     SPIDER VEINS  . Other Paternal Aunt     SPIDER VEINS  . Seizures Cousin     PATERNAL   History  Substance Use Topics  . Smoking status: Current Every Day Smoker -- 0.25 packs/day    Types: Cigarettes  . Smokeless tobacco: Never Used  . Alcohol Use: Yes     Comment: occasional   OB History   Grav Para Term Preterm Abortions TAB SAB Ect Mult Living   6 5 5  0 1 0 1 0 0 4     Review of Systems  Constitutional: Negative for fever and fatigue.  HENT: Negative for congestion and drooling.   Eyes: Negative for pain.  Respiratory: Negative for cough and shortness of breath.   Cardiovascular: Negative for chest pain.  Gastrointestinal: Negative for nausea, vomiting, abdominal  pain and diarrhea.  Genitourinary: Negative for dysuria and hematuria.  Musculoskeletal: Negative for back pain, gait problem and neck pain.  Skin: Positive for rash. Negative for color change.  Neurological: Negative for dizziness and headaches.  Hematological: Negative for adenopathy.  Psychiatric/Behavioral: Negative for behavioral problems.  All other systems reviewed and are negative.     Allergies  Review of patient's allergies indicates no known allergies.  Home Medications   Prior to Admission medications   Medication Sig Start Date End Date Taking? Authorizing Provider  permethrin (ELIMITE) 5 % cream Apply to affected area once 03/20/14   [facility] Mut[facility], PA-C   BP 137/89  Pulse 73   Temp(Src) 98 F (36.7 C) (Oral)  Resp 20  Ht 5\' 4"  (1.626 m)  Wt 150 lb (68.04 kg)  BMI 25.73 kg/m2  SpO2 99%  LMP 04/02/2014  Breastfeeding? No Physical Exam  Nursing note and vitals reviewed. Constitutional: She is oriented to person, place, and time. She appears well-developed and well-nourished.  HENT:  Head: Normocephalic.  Mouth/Throat: Oropharynx is clear and moist. No oropharyngeal exudate.  Eyes: Conjunctivae and EOM are normal. Pupils are equal, round, and reactive to light.  Neck: Normal range of motion. Neck supple.  Cardiovascular: Normal rate, regular rhythm, normal heart sounds and intact distal pulses.  Exam reveals no gallop and no friction rub.   No murmur heard. Pulmonary/Chest: Effort normal and breath sounds normal. No respiratory distress. She has no wheezes.  Abdominal: Soft. Bowel sounds are normal. There is no tenderness. There is no rebound and no guarding.  Musculoskeletal: Normal range of motion. She exhibits no edema and no tenderness.  Neurological: She is alert and oriented to person, place, and time.  Skin: Skin is warm and dry. Rash noted.  Old scattered sparsely distributed excoriated lesions diffusely. Occasional small erythematous papular lesions are noted on the extremities and the trunk. There are no mucocutaneous lesions.  Psychiatric: She has a normal mood and affect. Her behavior is normal.    ED Course  Procedures (including critical care time) Labs Review Labs Reviewed  WET PREP, GENITAL - Abnormal; Notable for the following:    Clue Cells Wet Prep HPF POC FEW (*)    WBC, Wet Prep HPF POC FEW (*)    All other components within normal limits  GC/CHLAMYDIA PROBE AMP  RPR  HIV ANTIBODY (ROUTINE TESTING)  POC URINE PREG, ED    Imaging Review No results found.   EKG Interpretation None      MDM   Final diagnoses:  BV (bacterial vaginosis)    4:56 PM 33 y.o. female who presents with ongoing pruritic rash and foul smell  from her vagina. She states that she was recently seen here and treated for scabies. She notes moderate relief but has recently seen new red lesions forming. Her partner has also noticed a recurrence of lesions on himself as well. Neg RPR in 2013. Will repeat. I suspect this is likely a recurrence of scabies and will recommend treatment again. She also notes a foul smell from her vaginal area over the last few days. She states that she was recently treated empirically for STDs on her previous visit. She denies any vaginal discharge, fever, abdominal pain, or dysuria. She is afebrile and vital signs are unremarkable here. Will perform pelvic exam.   6:40 PM: Sx possibly related to bacterial vaginosis as she does have a few clue cells on her wet prep. Will treat with Flagyl. Will re-tx for scabies.  I have discussed the diagnosis/risks/treatment options with the patient and believe the pt to be eligible for discharge home to follow-up with her pcp as needed. We also discussed returning to the ED immediately if new or worsening sx occur. We discussed the sx which are most concerning (e.g., worsening vag d/c, abd pain, fever) that necessitate immediate return. Medications administered to the patient during their visit and any new prescriptions provided to the patient are listed below.  Medications given during this visit Medications - No data to display  New Prescriptions   METRONIDAZOLE (FLAGYL) 500 MG TABLET    Take 1 tablet (500 mg total) by mouth 2 (two) times daily. One po bid x 7 days      Purvis Sheffield, MD 04/02/14 [id number]

## 2014-04-02 NOTE — ED Notes (Signed)
Pt c/o rash to B/L arms. Legs, back and feet. Pt diagnosed with scabies 2 weeks ago. Pt also here for STD check. Pt reports that she had clamydia last time she was here and wanted to be rechecked. She also believes that she has a bacterial infection.

## 2014-04-03 LAB — RPR

## 2014-04-03 LAB — HIV ANTIBODY (ROUTINE TESTING W REFLEX): HIV 1&2 Ab, 4th Generation: NONREACTIVE

## 2014-04-04 LAB — GC/CHLAMYDIA PROBE AMP
CT PROBE, AMP APTIMA: NEGATIVE
GC Probe RNA: NEGATIVE

## 2014-06-19 ENCOUNTER — Ambulatory Visit (INDEPENDENT_AMBULATORY_CARE_PROVIDER_SITE_OTHER): Payer: Self-pay | Admitting: Family

## 2014-06-19 ENCOUNTER — Encounter: Payer: Self-pay | Admitting: Family

## 2014-06-19 VITALS — BP 121/82 | HR 88 | Temp 98.2°F | Wt 151.1 lb

## 2014-06-19 DIAGNOSIS — O34219 Maternal care for unspecified type scar from previous cesarean delivery: Secondary | ICD-10-CM

## 2014-06-19 DIAGNOSIS — Z0289 Encounter for other administrative examinations: Secondary | ICD-10-CM | POA: Insufficient documentation

## 2014-06-19 DIAGNOSIS — O34512 Maternal care for incarceration of gravid uterus, second trimester: Secondary | ICD-10-CM

## 2014-06-19 DIAGNOSIS — O3421 Maternal care for scar from previous cesarean delivery: Secondary | ICD-10-CM

## 2014-06-19 DIAGNOSIS — Z124 Encounter for screening for malignant neoplasm of cervix: Secondary | ICD-10-CM

## 2014-06-19 DIAGNOSIS — Z23 Encounter for immunization: Secondary | ICD-10-CM

## 2014-06-19 DIAGNOSIS — Z1151 Encounter for screening for human papillomavirus (HPV): Secondary | ICD-10-CM

## 2014-06-19 DIAGNOSIS — Z118 Encounter for screening for other infectious and parasitic diseases: Secondary | ICD-10-CM

## 2014-06-19 DIAGNOSIS — Z113 Encounter for screening for infections with a predominantly sexual mode of transmission: Secondary | ICD-10-CM

## 2014-06-19 DIAGNOSIS — Z3492 Encounter for supervision of normal pregnancy, unspecified, second trimester: Secondary | ICD-10-CM

## 2014-06-19 DIAGNOSIS — O360121 Maternal care for anti-D [Rh] antibodies, second trimester, fetus 1: Secondary | ICD-10-CM

## 2014-06-19 DIAGNOSIS — Z3482 Encounter for supervision of other normal pregnancy, second trimester: Secondary | ICD-10-CM

## 2014-06-19 LAB — POCT URINALYSIS DIP (DEVICE)
Bilirubin Urine: NEGATIVE
Glucose, UA: NEGATIVE mg/dL
Hgb urine dipstick: NEGATIVE
KETONES UR: NEGATIVE mg/dL
NITRITE: NEGATIVE
PH: 7 (ref 5.0–8.0)
PROTEIN: NEGATIVE mg/dL
Specific Gravity, Urine: 1.015 (ref 1.005–1.030)
UROBILINOGEN UA: 0.2 mg/dL (ref 0.0–1.0)

## 2014-06-19 LAB — WET PREP, GENITAL
Clue Cells Wet Prep HPF POC: NONE SEEN
Trich, Wet Prep: NONE SEEN
Yeast Wet Prep HPF POC: NONE SEEN

## 2014-06-19 NOTE — Patient Instructions (Signed)
Second Trimester of Pregnancy The second trimester is from week 13 through week 28, months 4 through 6. The second trimester is often a time when you feel your best. Your body has also adjusted to being pregnant, and you begin to feel better physically. Usually, morning sickness has lessened or quit completely, you may have more energy, and you may have an increase in appetite. The second trimester is also a time when the fetus is growing rapidly. At the end of the sixth month, the fetus is about 9 inches long and weighs about 1 pounds. You will likely begin to feel the baby move (quickening) between 18 and 20 weeks of the pregnancy. BODY CHANGES Your body goes through many changes during pregnancy. The changes vary from woman to woman.   Your weight will continue to increase. You will notice your lower abdomen bulging out.  You may begin to get stretch marks on your hips, abdomen, and breasts.  You may develop headaches that can be relieved by medicines approved by your health care provider.  You may urinate more often because the fetus is pressing on your bladder.  You may develop or continue to have heartburn as a result of your pregnancy.  You may develop constipation because certain hormones are causing the muscles that push waste through your intestines to slow down.  You may develop hemorrhoids or swollen, bulging veins (varicose veins).  You may have back pain because of the weight gain and pregnancy hormones relaxing your joints between the bones in your pelvis and as a result of a shift in weight and the muscles that support your balance.  Your breasts will continue to grow and be tender.  Your gums may bleed and may be sensitive to brushing and flossing.  Dark spots or blotches (chloasma, mask of pregnancy) may develop on your face. This will likely fade after the baby is born.  A dark line from your belly button to the pubic area (linea nigra) may appear. This will likely fade  after the baby is born.  You may have changes in your hair. These can include thickening of your hair, rapid growth, and changes in texture. Some women also have hair loss during or after pregnancy, or hair that feels dry or thin. Your hair will most likely return to normal after your baby is born. WHAT TO EXPECT AT YOUR PRENATAL VISITS During a routine prenatal visit:  You will be weighed to make sure you and the fetus are growing normally.  Your blood pressure will be taken.  Your abdomen will be measured to track your baby's growth.  The fetal heartbeat will be listened to.  Any test results from the previous visit will be discussed. Your health care provider may ask you:  How you are feeling.  If you are feeling the baby move.  If you have had any abnormal symptoms, such as leaking fluid, bleeding, severe headaches, or abdominal cramping.  If you have any questions. Other tests that may be performed during your second trimester include:  Blood tests that check for:  Low iron levels (anemia).  Gestational diabetes (between 24 and 28 weeks).  Rh antibodies.  Urine tests to check for infections, diabetes, or protein in the urine.  An ultrasound to confirm the proper growth and development of the baby.  An amniocentesis to check for possible genetic problems.  Fetal screens for spina bifida and Down syndrome. HOME CARE INSTRUCTIONS   Avoid all smoking, herbs, alcohol, and unprescribed   drugs. These chemicals affect the formation and growth of the baby.  Follow your health care provider's instructions regarding medicine use. There are medicines that are either safe or unsafe to take during pregnancy.  Exercise only as directed by your health care provider. Experiencing uterine cramps is a good sign to stop exercising.  Continue to eat regular, healthy meals.  Wear a good support bra for breast tenderness.  Do not use hot tubs, steam rooms, or saunas.  Wear your  seat belt at all times when driving.  Avoid raw meat, uncooked cheese, cat litter boxes, and soil used by cats. These carry germs that can cause birth defects in the baby.  Take your prenatal vitamins.  Try taking a stool softener (if your health care provider approves) if you develop constipation. Eat more high-fiber foods, such as fresh vegetables or fruit and whole grains. Drink plenty of fluids to keep your urine clear or pale yellow.  Take warm sitz baths to soothe any pain or discomfort caused by hemorrhoids. Use hemorrhoid cream if your health care provider approves.  If you develop varicose veins, wear support hose. Elevate your feet for 15 minutes, 3-4 times a day. Limit salt in your diet.  Avoid heavy lifting, wear low heel shoes, and practice good posture.  Rest with your legs elevated if you have leg cramps or low back pain.  Visit your dentist if you have not gone yet during your pregnancy. Use a soft toothbrush to brush your teeth and be gentle when you floss.  A sexual relationship may be continued unless your health care provider directs you otherwise.  Continue to go to all your prenatal visits as directed by your health care provider. SEEK MEDICAL CARE IF:   You have dizziness.  You have mild pelvic cramps, pelvic pressure, or nagging pain in the abdominal area.  You have persistent nausea, vomiting, or diarrhea.  You have a bad smelling vaginal discharge.  You have pain with urination. SEEK IMMEDIATE MEDICAL CARE IF:   You have a fever.  You are leaking fluid from your vagina.  You have spotting or bleeding from your vagina.  You have severe abdominal cramping or pain.  You have rapid weight gain or loss.  You have shortness of breath with chest pain.  You notice sudden or extreme swelling of your face, hands, ankles, feet, or legs.  You have not felt your baby move in over an hour.  You have severe headaches that do not go away with  medicine.  You have vision changes. Document Released: 08/05/2001 Document Revised: 08/16/2013 Document Reviewed: 10/12/2012 ExitCare Patient Information 2015 [employer]. This information is not intended to replace advice given to you by your health care provider. Make sure you discuss any questions you have with your health care provider.  

## 2014-06-19 NOTE — Progress Notes (Signed)
Initial OB appointment Declined flu vaccine 1hour gtt Pt reports falling twice during the pregnancy"feels like something is not right" Pt states she has a problem with her antibodies and has to go to MFM

## 2014-06-19 NOTE — Progress Notes (Signed)
Subjective:    Tina [name] is a [id number] being seen today for her first obstetrical visit.  Her obstetrical history is significant for A1 diabetes in pregnancy.  Prior Csection x 6.  Patient does intend to breast feed. Pregnancy history fully reviewed.  Patient reports no complaints.  Filed Vitals:   06/19/14 1424 06/19/14 1431  BP: 131/76 121/82  Pulse: 88   Temp: 98.2 F (36.8 C)   Weight: 151 lb 1.6 oz (68.539 kg)     HISTORY: OB History  Gravida Para Term Preterm AB SAB TAB Ectopic Multiple Living  8 6 6  0 1 1 0 0 0 6    # Outcome Date GA Lbr Len/2nd Weight Sex Delivery Anes PTL Lv  8 CUR           7 TRM 05/03/13 6556w4d  5 lb 4.7 oz (2.4 kg) M LTCS EPI  Y  6 TRM 04/23/12 5478w0d  5 lb 3.3 oz (2.36 kg) F LTCS Spinal  Y  5 TRM 02/28/11 485w0d  7 lb (3.175 kg) M LTCS Spinal  Y  4 TRM 04/25/09 8109w0d  6 lb (2.722 kg) F LTCS Spinal  Y  3 TRM 04/13/07 9109w0d  6 lb 13 oz (3.09 kg) M LTCS Spinal  Y  2 SAB 2007          1 TRM 05/2004 3624w0d  7 lb 5 oz (3.317 kg) F LTCS EPI  Y     Comments: PTL W/ TERM DELIVERY     Past Medical History  Diagnosis Date  . Gonorrhea   . Trichimoniasis   . Hx of chlamydia infection   . H/O bacterial infection   . H/O varicella   . Headache(784.0)     Frequent  . Acid reflux   . Postpartum depression   . Depression     NO MEDS  . Abnormal Pap smear     2003 ASC-US 2005 ASC-US LSIL high risk HPV;LAST PAP A WHILE AGO  . 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 H/O migraine   . Gestational diabetes 2008    NO MEDS;DIET CONTROLLED  . Gestational diabetes mellitus 2012    NO MEDS; DIET CONTROLLED  . 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 Status post cesarean delivery 04/23/2012   Past Surgical History  Procedure Laterality Date  . Cesarean section  2005    x4;FIRST CHILD IN FETAL DISTRESS  . Cesarean section  04/23/2012    Procedure: CESAREAN SECTION;  Surgeon: Purcell NailsAngela Y Roberts, MD;  Location: [facility];  Service:  Obstetrics;  Laterality: N/A;  . Cesarean section N/A 05/03/2013    Procedure: REPEAT CESAREAN SECTION WITH POSS ABDOMINAL HYSTERECTOMY;  Surgeon: Purcell NailsAngela Y Roberts, MD;  Location: [facility];  Service: Obstetrics;  Laterality: N/A;   Family History  Problem Relation Age of Onset  . Hypertension Paternal Grandfather   . Anesthesia problems Neg Hx   . Hypotension Neg Hx   . Malignant hyperthermia Neg Hx   . Pseudochol deficiency Neg Hx   . COPD Mother     Bronchitis  . Asthma Brother   . COPD Brother     Bronchitis  . Hypertension Paternal Grandmother   . Other Paternal Grandmother     SPIDER VEINS  . Other Paternal Aunt     SPIDER VEINS  . Seizures Cousin     PATERNAL     Exam   BP 121/82  Pulse 88  Temp(Src) 98.2 F (36.8 C)  Wt 151 lb 1.6 oz (68.539 kg)  LMP 02/28/2014 Uterine Size: size equals dates  Pelvic Exam:    Perineum: No Hemorrhoids, Normal Perineum   Vulva: normal   Vagina:  normal mucosa, normal discharge, no palpable nodules   pH: Not done   Cervix: no bleeding following Pap, no cervical motion tenderness and no lesions   Adnexa: normal adnexa and no mass, fullness, tenderness   Bony Pelvis: Adequate  System: Breast:  No nipple retraction or dimpling, No nipple discharge or bleeding, No axillary or supraclavicular adenopathy, Normal to palpation without dominant masses   Skin: normal coloration and turgor, no rashes. Multiple tattoos.     Neurologic: negative   Extremities: normal strength, tone, and muscle mass   HEENT neck supple with midline trachea and thyroid without masses   Mouth/Teeth mucous membranes moist, pharynx normal without lesions.  Missing front teeth.     Neck supple and no masses   Cardiovascular: regular rate and rhythm, no murmurs or gallops   Respiratory:  appears well, vitals normal, no respiratory distress, acyanotic, normal RR, neck free of mass or lymphadenopathy, chest clear, no wheezing, crepitations, rhonchi, normal symmetric air  entry   Abdomen: soft, non-tender; bowel sounds normal; no masses,  no organomegaly   Urinary: urethral meatus normal      Assessment:    Pregnancy:  33 yo [id number] at 7231w6d wks IUP Incarcerated Gravid Patient Active Problem List   Diagnosis Date Noted  . Rh negative state in antepartum period 02/05/2012  . Cannabis abuse 03/21/2011  . Smoker 03/21/2011       Plan:     Initial labs drawn. Pap collected w/GC/CT. Early 1 hr. Prenatal vitamins. Problem list reviewed and updated. Genetic Screening discussed Quad Screen: next visit.  Marland Kitchen.  Ultrasound discussed; fetal survey: ordered..  Follow up in 4 weeks.   Marlis EdelsonKARIM, Isadore Bokhari N 06/19/2014

## 2014-06-19 NOTE — Addendum Note (Signed)
Addended by: Aldona LentoFISHER, Waldine Zenz L on: 06/19/2014 04:01 PM   Modules accepted: Orders

## 2014-06-20 ENCOUNTER — Encounter: Payer: Self-pay | Admitting: Family

## 2014-06-20 DIAGNOSIS — O24419 Gestational diabetes mellitus in pregnancy, unspecified control: Secondary | ICD-10-CM | POA: Insufficient documentation

## 2014-06-20 LAB — HIV ANTIBODY (ROUTINE TESTING W REFLEX): HIV: NONREACTIVE

## 2014-06-20 LAB — GLUCOSE TOLERANCE, 1 HOUR (50G) W/O FASTING: Glucose, 1 Hour GTT: 194 mg/dL — ABNORMAL HIGH (ref 70–140)

## 2014-06-20 LAB — CYTOLOGY - PAP

## 2014-06-21 LAB — PRESCRIPTION MONITORING PROFILE (19 PANEL)
AMPHETAMINE/METH: NEGATIVE ng/mL
BARBITURATE SCREEN, URINE: NEGATIVE ng/mL
BUPRENORPHINE, URINE: NEGATIVE ng/mL
Benzodiazepine Screen, Urine: NEGATIVE ng/mL
CANNABINOID SCRN UR: NEGATIVE ng/mL
Carisoprodol, Urine: NEGATIVE ng/mL
Cocaine Metabolites: NEGATIVE ng/mL
Creatinine, Urine: 119.87 mg/dL (ref >20.0)
Fentanyl, Ur: NEGATIVE ng/mL
MDMA URINE: NEGATIVE ng/mL
METHAQUALONE SCREEN (URINE): NEGATIVE ng/mL
Meperidine, Ur: NEGATIVE ng/mL
Methadone Screen, Urine: NEGATIVE ng/mL
Nitrites, Initial: NEGATIVE ug/mL
OPIATE SCREEN, URINE: NEGATIVE ng/mL
Oxycodone Screen, Ur: NEGATIVE ng/mL
Phencyclidine, Ur: NEGATIVE ng/mL
Propoxyphene: NEGATIVE ng/mL
TAPENTADOLUR: NEGATIVE ng/mL
Tramadol Scrn, Ur: NEGATIVE ng/mL
ZOLPIDEM, URINE: NEGATIVE ng/mL
pH, Initial: 7.1 pH (ref 4.5–8.9)

## 2014-06-21 LAB — OBSTETRIC PANEL
ANTIBODY SCREEN: POSITIVE — AB
BASOS ABS: 0 10*3/uL (ref 0.0–0.1)
Basophils Relative: 0 % (ref 0–1)
EOS PCT: 1 % (ref 0–5)
Eosinophils Absolute: 0.1 10*3/uL (ref 0.0–0.7)
HCT: 37.1 % (ref 36.0–46.0)
Hemoglobin: 12.4 g/dL (ref 12.0–15.0)
Hepatitis B Surface Ag: NEGATIVE
LYMPHS ABS: 1.4 10*3/uL (ref 0.7–4.0)
LYMPHS PCT: 25 % (ref 12–46)
MCH: 28.8 pg (ref 26.0–34.0)
MCHC: 33.4 g/dL (ref 30.0–36.0)
MCV: 86.3 fL (ref 78.0–100.0)
MONO ABS: 0.2 10*3/uL (ref 0.1–1.0)
Monocytes Relative: 4 % (ref 3–12)
NEUTROS ABS: 3.9 10*3/uL (ref 1.7–7.7)
Neutrophils Relative %: 70 % (ref 43–77)
Platelets: 172 10*3/uL (ref 150–400)
RBC: 4.3 MIL/uL (ref 3.87–5.11)
RDW: 14 % (ref 11.5–15.5)
RUBELLA: 3.25 {index} — AB (ref <0.90)
Rh Type: NEGATIVE
WBC: 5.6 10*3/uL (ref 4.0–10.5)

## 2014-06-21 LAB — ANTIBODY TITER (PRENATAL TITER): AB TITER: 16

## 2014-06-21 LAB — CULTURE, OB URINE: Colony Count: 40000

## 2014-06-21 LAB — PRENATAL ANTIBODY IDENTIFICATION

## 2014-06-22 LAB — HEMOGLOBINOPATHY EVALUATION
HEMOGLOBIN OTHER: 0 %
HGB F QUANT: 0 % (ref 0.0–2.0)
HGB S QUANTITAION: 0 %
Hgb A2 Quant: 2.7 % (ref 2.2–3.2)
Hgb A: 97.3 % (ref 96.8–97.8)

## 2014-06-26 ENCOUNTER — Encounter: Payer: Self-pay | Admitting: Family

## 2014-06-26 ENCOUNTER — Other Ambulatory Visit: Payer: Self-pay | Admitting: Obstetrics & Gynecology

## 2014-06-26 ENCOUNTER — Ambulatory Visit (INDEPENDENT_AMBULATORY_CARE_PROVIDER_SITE_OTHER): Payer: Self-pay | Admitting: Obstetrics & Gynecology

## 2014-06-26 ENCOUNTER — Encounter: Payer: Self-pay | Attending: Obstetrics & Gynecology | Admitting: *Deleted

## 2014-06-26 ENCOUNTER — Ambulatory Visit: Payer: Self-pay

## 2014-06-26 VITALS — BP 114/71 | HR 88 | Temp 98.8°F | Wt 150.6 lb

## 2014-06-26 DIAGNOSIS — R7989 Other specified abnormal findings of blood chemistry: Secondary | ICD-10-CM

## 2014-06-26 DIAGNOSIS — R946 Abnormal results of thyroid function studies: Secondary | ICD-10-CM

## 2014-06-26 DIAGNOSIS — O24419 Gestational diabetes mellitus in pregnancy, unspecified control: Secondary | ICD-10-CM | POA: Insufficient documentation

## 2014-06-26 DIAGNOSIS — Z0289 Encounter for other administrative examinations: Secondary | ICD-10-CM

## 2014-06-26 DIAGNOSIS — O3421 Maternal care for scar from previous cesarean delivery: Secondary | ICD-10-CM

## 2014-06-26 DIAGNOSIS — Z008 Encounter for other general examination: Secondary | ICD-10-CM

## 2014-06-26 DIAGNOSIS — Z713 Dietary counseling and surveillance: Secondary | ICD-10-CM | POA: Insufficient documentation

## 2014-06-26 DIAGNOSIS — O34219 Maternal care for unspecified type scar from previous cesarean delivery: Secondary | ICD-10-CM

## 2014-06-26 LAB — COMPREHENSIVE METABOLIC PANEL WITH GFR
ALT: <8 U/L (ref 0–35)
AST: 9 U/L (ref 0–37)
Albumin: 3.9 g/dL (ref 3.5–5.2)
Alkaline Phosphatase: 31 U/L — ABNORMAL LOW (ref 39–117)
BUN: 4 mg/dL — ABNORMAL LOW (ref 6–23)
CO2: 24 meq/L (ref 19–32)
Calcium: 8.5 mg/dL (ref 8.4–10.5)
Chloride: 101 meq/L (ref 96–112)
Creat: 0.51 mg/dL (ref 0.50–1.10)
Glucose, Bld: 86 mg/dL (ref 70–99)
Potassium: 4 meq/L (ref 3.5–5.3)
Sodium: 136 meq/L (ref 135–145)
Total Bilirubin: 0.2 mg/dL (ref 0.2–1.2)
Total Protein: 6.1 g/dL (ref 6.0–8.3)

## 2014-06-26 LAB — POCT URINALYSIS DIP (DEVICE)
BILIRUBIN URINE: NEGATIVE
GLUCOSE, UA: NEGATIVE mg/dL
Hgb urine dipstick: NEGATIVE
Ketones, ur: NEGATIVE mg/dL
Leukocytes, UA: NEGATIVE
Nitrite: NEGATIVE
Protein, ur: NEGATIVE mg/dL
SPECIFIC GRAVITY, URINE: 1.02 (ref 1.005–1.030)
UROBILINOGEN UA: 0.2 mg/dL (ref 0.0–1.0)
pH: 7 (ref 5.0–8.0)

## 2014-06-26 LAB — TSH: TSH: 0.329 u[IU]/mL — ABNORMAL LOW (ref 0.350–4.500)

## 2014-06-26 MED ORDER — PRENATAL VITAMINS 0.8 MG PO TABS: 1.0000 | ORAL_TABLET | Freq: Every day | ORAL | Status: DC

## 2014-06-26 MED ORDER — ASPIRIN EC 81 MG PO TBEC: 81.0000 mg | DELAYED_RELEASE_TABLET | Freq: Every day | ORAL | Status: DC

## 2014-06-26 NOTE — Progress Notes (Signed)
Detailed U/S 07/05/14 @ 1030a.  Fetal Echo with Dr. Elizebeth Brookingotton 08/15/14 @ 830a. Per "April" @ #[phone] [facility]

## 2014-06-26 NOTE — Progress Notes (Signed)
New diagnosis of A2/B GDM.  Also seen today for report of vaginal bleeding. No bleeding seen in vagina or around cervix on exam, closed cervix. [x]  Baseline labs (CBC, CMP, urine Pr:Cr,TSH, HgA1C) [x]  Baby ASA prescribed [x]  Fetal ECHO scheduled [x]  Optho exam [x]  Detailed Anatomy scan ordered today [ ]  Serial growth scans 20-24-28-32-35-38   [ ]  Antenatal testing starting at 32 weeks [ ]  Delivery by 39 weeks or earlier if needed Discussed implications of DM in pregnancy, need for optimizing glycemic control to decrease DM associated maternal-fetal morbidity and mortality, need for antenatal testing and frequent ultrasounds/prenatal visits. Will check baseline labs today, get DM education today.  No other complaints or concerns.  Routine obstetric precautions reviewed.

## 2014-06-26 NOTE — Progress Notes (Signed)
Patient here for Diabetes Education for GDM. [facility]  It was determined that her meals are provided by the jail and any monitoring of her BG will also be done by the jail medical team so I provided Diabetes Education today Discussed:  Physiology of Diabetes including GDM  Rationale of spreading carbohydrate foods evenly throughout the day  Basic Carb Counting by food group  Target BG ranges pre meal and 2 hour post meal  Patient expressed good verbal understanding of info taught Follow up PRN

## 2014-06-26 NOTE — Progress Notes (Signed)
[facility] for diabetic educator appointment. C/o cramping and scant vaginal bleeding.

## 2014-06-27 LAB — HEMOGLOBIN A1C
Hgb A1c MFr Bld: 6 % — ABNORMAL HIGH (ref <5.7)
Mean Plasma Glucose: 126 mg/dL — ABNORMAL HIGH (ref <117)

## 2014-06-27 LAB — PROTEIN / CREATININE RATIO, URINE
CREATININE, URINE: 140.9 mg/dL
Protein Creatinine Ratio: 0.06 (ref <0.15)
Total Protein, Urine: 8 mg/dL (ref 5–24)

## 2014-06-27 NOTE — Patient Instructions (Signed)
Return to clinic for any obstetric concerns or go to [facility] for evaluation  

## 2014-06-27 NOTE — Addendum Note (Signed)
Addended by: Jaynie CollinsANYANWU, Sissi Padia A on: 06/27/2014 05:37 PM   Modules accepted: Orders

## 2014-06-28 ENCOUNTER — Encounter: Payer: Self-pay | Admitting: Obstetrics & Gynecology

## 2014-06-28 DIAGNOSIS — O280 Abnormal hematological finding on antenatal screening of mother: Secondary | ICD-10-CM | POA: Insufficient documentation

## 2014-06-28 LAB — AFP, QUAD SCREEN
AFP: 8 ng/mL
Age Alone: 1:552 {titer}
Curr Gest Age: 16.6 wks.days
HCG, Total: 81.95 IU/mL
INH: 274.1 pg/mL
INTERPRETATION-AFP: POSITIVE — AB
MOM FOR INH: 1.6
MoM for AFP: 0.19
MoM for hCG: 2.23
OPEN SPINA BIFIDA: NEGATIVE
Osb Risk: <1:54600 {titer}
TRI 18 SCR RISK EST: NEGATIVE
Trisomy 18 (Edward) Syndrome Interp.: 1:398 {titer}
UE3 MOM: 0.16
uE3 Value: 0.16 ng/mL

## 2014-06-28 LAB — T3, FREE: T3, Free: 3.2 pg/mL (ref 2.3–4.2)

## 2014-06-28 LAB — T4, FREE: Free T4: 1.13 ng/dL (ref 0.80–1.80)

## 2014-06-29 ENCOUNTER — Telehealth: Payer: Self-pay | Admitting: *Deleted

## 2014-06-29 NOTE — Telephone Encounter (Signed)
-----   Message from Tereso NewcomerUgonna A Anyanwu, MD sent at 06/28/2014 12:09 PM EST ----- Abnormal quad screen ROD 1:7 -> Will follow up ultrasound to confirm correct dating.  If still abnormal, will need to be offered NIPS, genetic counseling.  Patient is incarcerated, but needs to be informed of these results.

## 2014-06-29 NOTE — Telephone Encounter (Signed)
[facility]  Asked Nurse to relay the following information.  Blood test showed Down's Sydrome risk is 1:7, need follow ultrasound, date/time given. It dating is correct, we will send for NIPS and genetic counseling.  Nurse verbalizes understanding and read back information.

## 2014-07-05 ENCOUNTER — Ambulatory Visit (HOSPITAL_COMMUNITY): Payer: Self-pay

## 2014-07-05 ENCOUNTER — Ambulatory Visit (HOSPITAL_COMMUNITY)
Admission: RE | Admit: 2014-07-05 | Discharge: 2014-07-05 | Disposition: A | Discharge status: Home / Self Care | Source: Ambulatory Visit | Attending: Obstetrics & Gynecology | Admitting: Obstetrics & Gynecology

## 2014-07-05 DIAGNOSIS — O99322 Drug use complicating pregnancy, second trimester: Secondary | ICD-10-CM | POA: Diagnosis not present

## 2014-07-05 DIAGNOSIS — F17210 Nicotine dependence, cigarettes, uncomplicated: Secondary | ICD-10-CM | POA: Insufficient documentation

## 2014-07-05 DIAGNOSIS — O99332 Smoking (tobacco) complicating pregnancy, second trimester: Secondary | ICD-10-CM | POA: Insufficient documentation

## 2014-07-05 DIAGNOSIS — Z3A14 14 weeks gestation of pregnancy: Secondary | ICD-10-CM | POA: Insufficient documentation

## 2014-07-05 DIAGNOSIS — F1220 Cannabis dependence, uncomplicated: Secondary | ICD-10-CM | POA: Insufficient documentation

## 2014-07-05 DIAGNOSIS — O24410 Gestational diabetes mellitus in pregnancy, diet controlled: Secondary | ICD-10-CM | POA: Insufficient documentation

## 2014-07-05 DIAGNOSIS — O24419 Gestational diabetes mellitus in pregnancy, unspecified control: Secondary | ICD-10-CM

## 2014-07-05 DIAGNOSIS — O99330 Smoking (tobacco) complicating pregnancy, unspecified trimester: Secondary | ICD-10-CM | POA: Insufficient documentation

## 2014-07-05 DIAGNOSIS — O3421 Maternal care for scar from previous cesarean delivery: Secondary | ICD-10-CM | POA: Diagnosis not present

## 2014-07-06 ENCOUNTER — Encounter: Payer: Self-pay | Admitting: Obstetrics & Gynecology

## 2014-07-17 ENCOUNTER — Ambulatory Visit (INDEPENDENT_AMBULATORY_CARE_PROVIDER_SITE_OTHER): Payer: Self-pay | Admitting: Obstetrics & Gynecology

## 2014-07-17 ENCOUNTER — Encounter: Payer: Self-pay | Admitting: Obstetrics & Gynecology

## 2014-07-17 VITALS — BP 107/74 | HR 82 | Temp 98.2°F | Wt 155.9 lb

## 2014-07-17 DIAGNOSIS — Z008 Encounter for other general examination: Secondary | ICD-10-CM

## 2014-07-17 DIAGNOSIS — Z0289 Encounter for other administrative examinations: Secondary | ICD-10-CM

## 2014-07-17 DIAGNOSIS — O24419 Gestational diabetes mellitus in pregnancy, unspecified control: Secondary | ICD-10-CM

## 2014-07-17 DIAGNOSIS — O360121 Maternal care for anti-D [Rh] antibodies, second trimester, fetus 1: Secondary | ICD-10-CM

## 2014-07-17 LAB — POCT URINALYSIS DIP (DEVICE)
Bilirubin Urine: NEGATIVE
Glucose, UA: NEGATIVE mg/dL
Hgb urine dipstick: NEGATIVE
Ketones, ur: NEGATIVE mg/dL
Nitrite: NEGATIVE
PROTEIN: NEGATIVE mg/dL
Specific Gravity, Urine: 1.015 (ref 1.005–1.030)
Urobilinogen, UA: 0.2 mg/dL (ref 0.0–1.0)
pH: 7.5 (ref 5.0–8.0)

## 2014-07-17 NOTE — Patient Instructions (Signed)
Return to clinic for any obstetric concerns or go to [facility] for evaluation  

## 2014-07-17 NOTE — Progress Notes (Signed)
Patient states that she already feels some movement; nursing staff in jail will not give aspirin to patient, maybe send a MD note with patient?

## 2014-07-17 NOTE — Progress Notes (Signed)
U/S with Radiology 08/14/14 @ 815a.

## 2014-07-17 NOTE — Progress Notes (Signed)
Notes were written for prison staff explaining need for Aspirin therapy, and wrote other nopte with other requests as per patient (see letters). No recorded blood sugars, patient reports to be following diet.   Quad screen redrawn today, anatomy scan rescheduled for for next visit.  Anti-D level titer rechecked today. No other complaints or concerns.  Routine obstetric precautions reviewed.

## 2014-07-18 LAB — AFP, QUAD SCREEN
AFP: 22.3 ng/mL
CURR GEST AGE: 15.6 wks.days
HCG TOTAL: 30.2 [IU]/mL
INH: 132.9 pg/mL
Interpretation-AFP: NEGATIVE
MOM FOR INH: 0.78
MoM for AFP: 0.69
MoM for hCG: 0.75
OPEN SPINA BIFIDA: NEGATIVE
Osb Risk: <1:27300 {titer}
Tri 18 Scr Risk Est: NEGATIVE
Trisomy 18 (Edward) Syndrome Interp.: 1:9100 {titer}
uE3 Mom: 0.8
uE3 Value: 0.6 ng/mL

## 2014-07-19 LAB — ANTIBODY SCREEN: ANTIBODY SCREEN: POSITIVE — AB

## 2014-07-19 LAB — ANTIBODY TITER (PRENATAL TITER): Ab Titer: 16

## 2014-07-19 LAB — PRENATAL ANTIBODY IDENTIFICATION

## 2014-08-14 ENCOUNTER — Encounter: Payer: Self-pay | Admitting: Obstetrics and Gynecology

## 2014-08-14 ENCOUNTER — Ambulatory Visit (HOSPITAL_COMMUNITY): Admission: RE | Admit: 2014-08-14 | Payer: Self-pay | Source: Ambulatory Visit

## 2014-08-24 ENCOUNTER — Other Ambulatory Visit: Payer: Self-pay | Admitting: Obstetrics & Gynecology

## 2014-08-24 ENCOUNTER — Ambulatory Visit (HOSPITAL_COMMUNITY)
Admission: RE | Admit: 2014-08-24 | Discharge: 2014-08-24 | Disposition: A | Discharge status: Home / Self Care | Payer: Self-pay | Source: Ambulatory Visit | Attending: Obstetrics & Gynecology | Admitting: Obstetrics & Gynecology

## 2014-08-24 ENCOUNTER — Ambulatory Visit (INDEPENDENT_AMBULATORY_CARE_PROVIDER_SITE_OTHER): Payer: Self-pay | Admitting: Family

## 2014-08-24 VITALS — BP 117/72 | HR 85 | Temp 98.4°F | Wt 161.7 lb

## 2014-08-24 DIAGNOSIS — Z36 Encounter for antenatal screening of mother: Secondary | ICD-10-CM | POA: Insufficient documentation

## 2014-08-24 DIAGNOSIS — O289 Unspecified abnormal findings on antenatal screening of mother: Secondary | ICD-10-CM | POA: Insufficient documentation

## 2014-08-24 DIAGNOSIS — O280 Abnormal hematological finding on antenatal screening of mother: Secondary | ICD-10-CM

## 2014-08-24 DIAGNOSIS — O24419 Gestational diabetes mellitus in pregnancy, unspecified control: Secondary | ICD-10-CM

## 2014-08-24 DIAGNOSIS — O3421 Maternal care for scar from previous cesarean delivery: Secondary | ICD-10-CM

## 2014-08-24 DIAGNOSIS — O99320 Drug use complicating pregnancy, unspecified trimester: Secondary | ICD-10-CM | POA: Insufficient documentation

## 2014-08-24 DIAGNOSIS — O4400 Placenta previa specified as without hemorrhage, unspecified trimester: Secondary | ICD-10-CM | POA: Insufficient documentation

## 2014-08-24 DIAGNOSIS — F1290 Cannabis use, unspecified, uncomplicated: Secondary | ICD-10-CM | POA: Insufficient documentation

## 2014-08-24 DIAGNOSIS — F17210 Nicotine dependence, cigarettes, uncomplicated: Secondary | ICD-10-CM | POA: Insufficient documentation

## 2014-08-24 DIAGNOSIS — O34219 Maternal care for unspecified type scar from previous cesarean delivery: Secondary | ICD-10-CM

## 2014-08-24 DIAGNOSIS — O24410 Gestational diabetes mellitus in pregnancy, diet controlled: Secondary | ICD-10-CM | POA: Insufficient documentation

## 2014-08-24 DIAGNOSIS — Z3A21 21 weeks gestation of pregnancy: Secondary | ICD-10-CM | POA: Insufficient documentation

## 2014-08-24 LAB — POCT URINALYSIS DIP (DEVICE)
BILIRUBIN URINE: NEGATIVE
Glucose, UA: NEGATIVE mg/dL
HGB URINE DIPSTICK: NEGATIVE
Ketones, ur: NEGATIVE mg/dL
Nitrite: NEGATIVE
PH: 6.5 (ref 5.0–8.0)
PROTEIN: NEGATIVE mg/dL
SPECIFIC GRAVITY, URINE: 1.02 (ref 1.005–1.030)
Urobilinogen, UA: 0.2 mg/dL (ref 0.0–1.0)

## 2014-08-24 NOTE — Patient Instructions (Signed)
PATIENT NEEDS TO CHECK BLOOD GLUCOSE FIRST THING IN MORNING, BEFORE BREAKFAST AND 2 HOURS AFTER LUNCH.  PLEASE SEND GLUCOSE LOG TO EVERY VISIT.

## 2014-08-24 NOTE — Progress Notes (Signed)
Pt missed her 1st fetal echo (uncertain in jail notified patient), rescheduled for 09/19/13.  Guard did not bring glucose log.  Pt states only checking twice and that they have been normal.  Refuses to check 4x/day, states that would make her be in lock down.  Pt agrees to check FBS and 2 hours PP lunch.  Anatomy ultrasound today at 11:15.  Quad redrawn based on new due date.

## 2014-08-24 NOTE — Progress Notes (Signed)
Fetal Echo rescheduled with Dr. Elizebeth Brookingotton 09/19/14 @ 1pm

## 2014-08-24 NOTE — Progress Notes (Signed)
Received fax from [facility] after patient left with glucose log, only 3 checks documented in two weeks:    FBS 94, 86 and 2 HR PP dinner 161.  All other entries noted that patient refused to have blood drawn.

## 2014-08-26 DIAGNOSIS — Z3A21 21 weeks gestation of pregnancy: Secondary | ICD-10-CM | POA: Insufficient documentation

## 2014-08-26 DIAGNOSIS — IMO0002 Reserved for concepts with insufficient information to code with codable children: Secondary | ICD-10-CM | POA: Insufficient documentation

## 2014-08-26 DIAGNOSIS — Z0489 Encounter for examination and observation for other specified reasons: Secondary | ICD-10-CM | POA: Insufficient documentation

## 2014-08-28 ENCOUNTER — Encounter: Payer: Self-pay | Admitting: *Deleted

## 2014-08-31 LAB — AFP, QUAD SCREEN
AFP: 57.8 ng/mL
Curr Gest Age: 21.2 wks.days
Down Syndrome Scr Risk Est: 1:1980 {titer}
HCG, Total: 19.81 IU/mL
INH: 161.1 pg/mL
Interpretation-AFP: NEGATIVE
MoM for AFP: 0.8
MoM for INH: 0.86
MoM for hCG: 1.07
OPEN SPINA BIFIDA: NEGATIVE
TRI 18 SCR RISK EST: NEGATIVE
UE3 MOM: 0.82
UE3 VALUE: 1.79 ng/mL

## 2014-09-18 ENCOUNTER — Ambulatory Visit (INDEPENDENT_AMBULATORY_CARE_PROVIDER_SITE_OTHER): Payer: Self-pay | Admitting: Family Medicine

## 2014-09-18 VITALS — BP 109/63 | HR 84 | Temp 97.8°F | Wt 166.1 lb

## 2014-09-18 DIAGNOSIS — O360121 Maternal care for anti-D [Rh] antibodies, second trimester, fetus 1: Secondary | ICD-10-CM

## 2014-09-18 DIAGNOSIS — O24419 Gestational diabetes mellitus in pregnancy, unspecified control: Secondary | ICD-10-CM

## 2014-09-18 DIAGNOSIS — O3421 Maternal care for scar from previous cesarean delivery: Secondary | ICD-10-CM

## 2014-09-18 DIAGNOSIS — O34219 Maternal care for unspecified type scar from previous cesarean delivery: Secondary | ICD-10-CM

## 2014-09-18 DIAGNOSIS — Z008 Encounter for other general examination: Secondary | ICD-10-CM

## 2014-09-18 DIAGNOSIS — Z0289 Encounter for other administrative examinations: Secondary | ICD-10-CM

## 2014-09-18 LAB — CBC
HCT: 34.5 % — ABNORMAL LOW (ref 36.0–46.0)
Hemoglobin: 11.7 g/dL — ABNORMAL LOW (ref 12.0–15.0)
MCH: 31 pg (ref 26.0–34.0)
MCHC: 33.9 g/dL (ref 30.0–36.0)
MCV: 91.5 fL (ref 78.0–100.0)
MPV: 11.8 fL (ref 8.6–12.4)
Platelets: 155 10*3/uL (ref 150–400)
RBC: 3.77 MIL/uL — ABNORMAL LOW (ref 3.87–5.11)
RDW: 13.7 % (ref 11.5–15.5)
WBC: 9.1 10*3/uL (ref 4.0–10.5)

## 2014-09-18 LAB — RPR

## 2014-09-18 LAB — POCT URINALYSIS DIP (DEVICE)
Bilirubin Urine: NEGATIVE
Glucose, UA: NEGATIVE mg/dL
HGB URINE DIPSTICK: NEGATIVE
Ketones, ur: NEGATIVE mg/dL
Nitrite: NEGATIVE
PH: 6.5 (ref 5.0–8.0)
Protein, ur: NEGATIVE mg/dL
Specific Gravity, Urine: 1.01 (ref 1.005–1.030)
Urobilinogen, UA: 0.2 mg/dL (ref 0.0–1.0)

## 2014-09-18 LAB — HIV ANTIBODY (ROUTINE TESTING W REFLEX): HIV: NONREACTIVE

## 2014-09-18 NOTE — Addendum Note (Signed)
Addended by: Aldona LentoFISHER, Lorrine Killilea L on: 09/18/2014 08:41 AM   Modules accepted: Orders

## 2014-09-18 NOTE — Patient Instructions (Signed)
Second Trimester of Pregnancy The second trimester is from week 13 through week 28, months 4 through 6. The second trimester is often a time when you feel your best. Your body has also adjusted to being pregnant, and you begin to feel better physically. Usually, morning sickness has lessened or quit completely, you may have more energy, and you may have an increase in appetite. The second trimester is also a time when the fetus is growing rapidly. At the end of the sixth month, the fetus is about 9 inches long and weighs about 1 pounds. You will likely begin to feel the baby move (quickening) between 18 and 20 weeks of the pregnancy. BODY CHANGES Your body goes through many changes during pregnancy. The changes vary from woman to woman.   Your weight will continue to increase. You will notice your lower abdomen bulging out.  You may begin to get stretch marks on your hips, abdomen, and breasts.  You may develop headaches that can be relieved by medicines approved by your health care provider.  You may urinate more often because the fetus is pressing on your bladder.  You may develop or continue to have heartburn as a result of your pregnancy.  You may develop constipation because certain hormones are causing the muscles that push waste through your intestines to slow down.  You may develop hemorrhoids or swollen, bulging veins (varicose veins).  You may have back pain because of the weight gain and pregnancy hormones relaxing your joints between the bones in your pelvis and as a result of a shift in weight and the muscles that support your balance.  Your breasts will continue to grow and be tender.  Your gums may bleed and may be sensitive to brushing and flossing.  Dark spots or blotches (chloasma, mask of pregnancy) may develop on your face. This will likely fade after the baby is born.  A dark line from your belly button to the pubic area (linea nigra) may appear. This will likely fade  after the baby is born.  You may have changes in your hair. These can include thickening of your hair, rapid growth, and changes in texture. Some women also have hair loss during or after pregnancy, or hair that feels dry or thin. Your hair will most likely return to normal after your baby is born. WHAT TO EXPECT AT YOUR PRENATAL VISITS During a routine prenatal visit:  You will be weighed to make sure you and the fetus are growing normally.  Your blood pressure will be taken.  Your abdomen will be measured to track your baby's growth.  The fetal heartbeat will be listened to.  Any test results from the previous visit will be discussed. Your health care provider may ask you:  How you are feeling.  If you are feeling the baby move.  If you have had any abnormal symptoms, such as leaking fluid, bleeding, severe headaches, or abdominal cramping.  If you have any questions. Other tests that may be performed during your second trimester include:  Blood tests that check for:  Low iron levels (anemia).  Gestational diabetes (between 24 and 28 weeks).  Rh antibodies.  Urine tests to check for infections, diabetes, or protein in the urine.  An ultrasound to confirm the proper growth and development of the baby.  An amniocentesis to check for possible genetic problems.  Fetal screens for spina bifida and Down syndrome. HOME CARE INSTRUCTIONS   Avoid all smoking, herbs, alcohol, and unprescribed   drugs. These chemicals affect the formation and growth of the baby.  Follow your health care provider's instructions regarding medicine use. There are medicines that are either safe or unsafe to take during pregnancy.  Exercise only as directed by your health care provider. Experiencing uterine cramps is a good sign to stop exercising.  Continue to eat regular, healthy meals.  Wear a good support bra for breast tenderness.  Do not use hot tubs, steam rooms, or saunas.  Wear your  seat belt at all times when driving.  Avoid raw meat, uncooked cheese, cat litter boxes, and soil used by cats. These carry germs that can cause birth defects in the baby.  Take your prenatal vitamins.  Try taking a stool softener (if your health care provider approves) if you develop constipation. Eat more high-fiber foods, such as fresh vegetables or fruit and whole grains. Drink plenty of fluids to keep your urine clear or pale yellow.  Take warm sitz baths to soothe any pain or discomfort caused by hemorrhoids. Use hemorrhoid cream if your health care provider approves.  If you develop varicose veins, wear support hose. Elevate your feet for 15 minutes, 3-4 times a day. Limit salt in your diet.  Avoid heavy lifting, wear low heel shoes, and practice good posture.  Rest with your legs elevated if you have leg cramps or low back pain.  Visit your dentist if you have not gone yet during your pregnancy. Use a soft toothbrush to brush your teeth and be gentle when you floss.  A sexual relationship may be continued unless your health care provider directs you otherwise.  Continue to go to all your prenatal visits as directed by your health care provider. SEEK MEDICAL CARE IF:   You have dizziness.  You have mild pelvic cramps, pelvic pressure, or nagging pain in the abdominal area.  You have persistent nausea, vomiting, or diarrhea.  You have a bad smelling vaginal discharge.  You have pain with urination. SEEK IMMEDIATE MEDICAL CARE IF:   You have a fever.  You are leaking fluid from your vagina.  You have spotting or bleeding from your vagina.  You have severe abdominal cramping or pain.  You have rapid weight gain or loss.  You have shortness of breath with chest pain.  You notice sudden or extreme swelling of your face, hands, ankles, feet, or legs.  You have not felt your baby move in over an hour.  You have severe headaches that do not go away with  medicine.  You have vision changes. Document Released: 08/05/2001 Document Revised: 08/16/2013 Document Reviewed: 10/12/2012 ExitCare Patient Information 2015 [employer]. This information is not intended to replace advice given to you by your health care provider. Make sure you discuss any questions you have with your health care provider.  

## 2014-09-18 NOTE — Progress Notes (Signed)
Will check antibody titers and 28 week labs.  Schedule US for growth at next appt.  Good fetal movement. Didn't bring log book Reports fastings less than 90 and pp less than 120. Asked for jail to fax log book.

## 2014-09-19 LAB — ANTIBODY TITER (PRENATAL TITER): Ab Titer: 16

## 2014-09-19 LAB — PRENATAL ANTIBODY IDENTIFICATION

## 2014-09-19 LAB — ANTIBODY SCREEN: ANTIBODY SCREEN: POSITIVE — AB

## 2014-09-20 ENCOUNTER — Encounter: Payer: Self-pay | Admitting: *Deleted

## 2014-09-21 ENCOUNTER — Encounter: Payer: Self-pay | Admitting: *Deleted

## 2014-09-21 DIAGNOSIS — O360990 Maternal care for other rhesus isoimmunization, unspecified trimester, not applicable or unspecified: Secondary | ICD-10-CM | POA: Insufficient documentation

## 2014-10-16 ENCOUNTER — Ambulatory Visit (INDEPENDENT_AMBULATORY_CARE_PROVIDER_SITE_OTHER): Payer: Self-pay | Admitting: Obstetrics and Gynecology

## 2014-10-16 ENCOUNTER — Ambulatory Visit (HOSPITAL_COMMUNITY)
Admission: RE | Admit: 2014-10-16 | Discharge: 2014-10-16 | Disposition: A | Discharge status: Home / Self Care | Payer: Self-pay | Source: Ambulatory Visit | Attending: Family Medicine | Admitting: Family Medicine

## 2014-10-16 VITALS — BP 118/73 | HR 96 | Temp 98.3°F | Wt 171.1 lb

## 2014-10-16 DIAGNOSIS — Z36 Encounter for antenatal screening of mother: Secondary | ICD-10-CM | POA: Insufficient documentation

## 2014-10-16 DIAGNOSIS — O360111 Maternal care for anti-D [Rh] antibodies, first trimester, fetus 1: Secondary | ICD-10-CM

## 2014-10-16 DIAGNOSIS — O24419 Gestational diabetes mellitus in pregnancy, unspecified control: Secondary | ICD-10-CM

## 2014-10-16 DIAGNOSIS — O3421 Maternal care for scar from previous cesarean delivery: Secondary | ICD-10-CM

## 2014-10-16 DIAGNOSIS — O99333 Smoking (tobacco) complicating pregnancy, third trimester: Secondary | ICD-10-CM | POA: Insufficient documentation

## 2014-10-16 DIAGNOSIS — F17210 Nicotine dependence, cigarettes, uncomplicated: Secondary | ICD-10-CM | POA: Insufficient documentation

## 2014-10-16 DIAGNOSIS — O24410 Gestational diabetes mellitus in pregnancy, diet controlled: Secondary | ICD-10-CM | POA: Insufficient documentation

## 2014-10-16 DIAGNOSIS — N898 Other specified noninflammatory disorders of vagina: Secondary | ICD-10-CM

## 2014-10-16 DIAGNOSIS — O34219 Maternal care for unspecified type scar from previous cesarean delivery: Secondary | ICD-10-CM

## 2014-10-16 DIAGNOSIS — Z3A28 28 weeks gestation of pregnancy: Secondary | ICD-10-CM | POA: Insufficient documentation

## 2014-10-16 DIAGNOSIS — Z23 Encounter for immunization: Secondary | ICD-10-CM

## 2014-10-16 DIAGNOSIS — Z0489 Encounter for examination and observation for other specified reasons: Secondary | ICD-10-CM | POA: Insufficient documentation

## 2014-10-16 DIAGNOSIS — IMO0002 Reserved for concepts with insufficient information to code with codable children: Secondary | ICD-10-CM | POA: Insufficient documentation

## 2014-10-16 DIAGNOSIS — F1220 Cannabis dependence, uncomplicated: Secondary | ICD-10-CM | POA: Insufficient documentation

## 2014-10-16 DIAGNOSIS — O99323 Drug use complicating pregnancy, third trimester: Secondary | ICD-10-CM | POA: Insufficient documentation

## 2014-10-16 LAB — CBC
HCT: 34.3 % — ABNORMAL LOW (ref 36.0–46.0)
HEMOGLOBIN: 11.5 g/dL — AB (ref 12.0–15.0)
MCH: 30.6 pg (ref 26.0–34.0)
MCHC: 33.5 g/dL (ref 30.0–36.0)
MCV: 91.2 fL (ref 78.0–100.0)
MPV: 12.1 fL (ref 8.6–12.4)
Platelets: 151 10*3/uL (ref 150–400)
RBC: 3.76 MIL/uL — ABNORMAL LOW (ref 3.87–5.11)
RDW: 13.1 % (ref 11.5–15.5)
WBC: 8 10*3/uL (ref 4.0–10.5)

## 2014-10-16 LAB — POCT URINALYSIS DIP (DEVICE)
BILIRUBIN URINE: NEGATIVE
GLUCOSE, UA: NEGATIVE mg/dL
Hgb urine dipstick: NEGATIVE
Ketones, ur: NEGATIVE mg/dL
NITRITE: NEGATIVE
PH: 6.5 (ref 5.0–8.0)
PROTEIN: NEGATIVE mg/dL
Specific Gravity, Urine: 1.01 (ref 1.005–1.030)
Urobilinogen, UA: 0.2 mg/dL (ref 0.0–1.0)

## 2014-10-16 MED ORDER — RHO D IMMUNE GLOBULIN 1500 UNIT/2ML IJ SOSY
300.0000 ug | PREFILLED_SYRINGE | Freq: Once | INTRAMUSCULAR | Status: AC
Start: 2014-10-16 — End: 2014-10-16
  Administered 2014-10-16: 300 ug via INTRAMUSCULAR

## 2014-10-16 MED ORDER — TETANUS-DIPHTH-ACELL PERTUSSIS 5-2.5-18.5 LF-MCG/0.5 IM SUSP
0.5000 mL | Freq: Once | INTRAMUSCULAR | Status: AC
  Administered 2014-10-16: 0.5 mL via INTRAMUSCULAR

## 2014-10-16 NOTE — Progress Notes (Signed)
[id number] at [id number]. Doing well today. No concerns or complaints. 1. GDM. Need blood glucose log from jail, requested. Continue baby aspirin and diet control. Growth scan scheduled for today. 2. Vaginal discharge. Wet prep sent.  3. Rh negative. Rhogam today. 4. Routine PNC. 28 week labs sent today. Tdap today. FM/PTL precautions reviewed.

## 2014-10-16 NOTE — Progress Notes (Signed)
Pt c/o of white, curdy discharge, Needs Rhogam, Tdap vaccine today

## 2014-10-17 LAB — WET PREP, GENITAL
CLUE CELLS WET PREP: NONE SEEN
Trich, Wet Prep: NONE SEEN
Yeast Wet Prep HPF POC: NONE SEEN

## 2014-10-17 LAB — STD PANEL
HIV 1&2 Ab, 4th Generation: NONREACTIVE
Hepatitis B Surface Ag: NEGATIVE

## 2014-10-26 ENCOUNTER — Encounter: Payer: Self-pay | Admitting: *Deleted

## 2014-10-30 ENCOUNTER — Encounter: Payer: Self-pay | Admitting: Obstetrics and Gynecology

## 2014-11-06 ENCOUNTER — Ambulatory Visit (HOSPITAL_COMMUNITY)
Admission: RE | Admit: 2014-11-06 | Discharge: 2014-11-06 | Disposition: A | Discharge status: Home / Self Care | Payer: Self-pay | Source: Ambulatory Visit | Attending: Family Medicine | Admitting: Family Medicine

## 2014-11-06 ENCOUNTER — Other Ambulatory Visit (HOSPITAL_COMMUNITY): Payer: Self-pay | Admitting: Maternal and Fetal Medicine

## 2014-11-06 ENCOUNTER — Encounter (HOSPITAL_COMMUNITY): Payer: Self-pay

## 2014-11-06 ENCOUNTER — Other Ambulatory Visit: Payer: Self-pay | Admitting: Obstetrics & Gynecology

## 2014-11-06 ENCOUNTER — Ambulatory Visit (INDEPENDENT_AMBULATORY_CARE_PROVIDER_SITE_OTHER): Payer: Self-pay | Admitting: Family Medicine

## 2014-11-06 VITALS — BP 119/71 | HR 95 | Temp 97.2°F | Wt 174.3 lb

## 2014-11-06 DIAGNOSIS — F17210 Nicotine dependence, cigarettes, uncomplicated: Secondary | ICD-10-CM | POA: Insufficient documentation

## 2014-11-06 DIAGNOSIS — O24419 Gestational diabetes mellitus in pregnancy, unspecified control: Secondary | ICD-10-CM

## 2014-11-06 DIAGNOSIS — O2690 Pregnancy related conditions, unspecified, unspecified trimester: Secondary | ICD-10-CM

## 2014-11-06 DIAGNOSIS — O34219 Maternal care for unspecified type scar from previous cesarean delivery: Secondary | ICD-10-CM

## 2014-11-06 DIAGNOSIS — F1220 Cannabis dependence, uncomplicated: Secondary | ICD-10-CM | POA: Insufficient documentation

## 2014-11-06 DIAGNOSIS — O24410 Gestational diabetes mellitus in pregnancy, diet controlled: Secondary | ICD-10-CM | POA: Insufficient documentation

## 2014-11-06 DIAGNOSIS — O99323 Drug use complicating pregnancy, third trimester: Secondary | ICD-10-CM | POA: Insufficient documentation

## 2014-11-06 DIAGNOSIS — O99333 Smoking (tobacco) complicating pregnancy, third trimester: Secondary | ICD-10-CM | POA: Insufficient documentation

## 2014-11-06 DIAGNOSIS — O360931 Maternal care for other rhesus isoimmunization, third trimester, fetus 1: Secondary | ICD-10-CM

## 2014-11-06 DIAGNOSIS — O360990 Maternal care for other rhesus isoimmunization, unspecified trimester, not applicable or unspecified: Secondary | ICD-10-CM | POA: Insufficient documentation

## 2014-11-06 DIAGNOSIS — Z3A31 31 weeks gestation of pregnancy: Secondary | ICD-10-CM | POA: Insufficient documentation

## 2014-11-06 NOTE — Progress Notes (Signed)
No BS--she reports watching her diet. States evening Bs are 90-110 Rhogam is not indicated as pt. 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

## 2014-11-06 NOTE — Patient Instructions (Addendum)
Postpartum Tubal Ligation A postpartum tubal ligation (PPTL) is a procedure that blocks the fallopian tubes right after childbirth or 1-2 days after childbirth. PPTL is done before the uterus returns to its normal location. The procedure is also called a minilaparotomy. By blocking the fallopian tubes, the eggs that are released from the ovaries cannot enter the uterus and sperm cannot reach the egg. A PPTL is done so you will not be able to get pregnant or have a baby.  Although this procedure may be reversed, it should be considered permanent and irreversible. If you want to have future pregnancies, you should not have this procedure. LET YOUR CAREGIVER KNOW ABOUT:  Allergies to food or medicine.  Medicines taken, including vitamins, herbs, eyedrops, over-the-counter medicines, and creams.  Use of steroids (by mouth or creams).  Previous problems with numbing medicines.  History of bleeding problems or blood clots.  Any recent colds or infections.  Previous surgery.  Other health problems, including diabetes and kidney problems. RISKS AND COMPLICATIONS  Infection.  Bleeding.  Injury to other organs.  Anesthetic side effects.  Failure of the procedure.  Ectopic pregnancy.  Future regret about having the procedure done. BEFORE THE PROCEDURE   You may need to sign certain permission forms with your insurance up to 30 days before your due date.  After delivering your baby, you cannot eat or drink anything if the procedure is performed the same day. If you are having the procedure a day after delivering, you may be able to eat and drink until midnight. Your caregiver will give you specific directions depending on your situation. PROCEDURE   If done 1-2 days after delivery:  You will be given a medicine to make you sleep (general anesthetic) during the procedure.  A tube will be put down your throat to help your breath while under general anesthesia.  A small cut  (incision) is made just beneath the belly button.  The fallopian tubes are brought up through the incision.  The fallopian tubes are then sealed, tied, or cut.  If done after a caesarean delivery:  Tubal ligation is done through the incision already made (after the baby is delivered).  Once the tubes are blocked, the incision is closed with stitches (sutures). A bandage will be placed over the incisions. AFTER THE PROCEDURE  You may have some pain or cramps in the abdominal area for the next 3-7 days.  You will be given pain medicine to ease any discomfort.  You may also feel sick to your stomach if you were given a general anesthetic.  You may have some mild discomfort in the throat. This is from the tube that may have been placed in your throat while you were sleeping.  You may feel tired and should rest the remainder of the day. Document Released: 08/11/2005 Document Revised: 02/10/2012 Document Reviewed: 11/22/2011 [facility] Patient Information 2015 [place], [place]. This information is not intended to replace advice given to you by your health care provider. Make sure you discuss any questions you have with your health care provider.  Gestational Diabetes Mellitus Gestational diabetes mellitus, often simply referred to as gestational diabetes, is a type of diabetes that some women develop during pregnancy. In gestational diabetes, the pancreas does not make enough insulin (a hormone), the cells are less responsive to the insulin that is made (insulin resistance), or both.Normally, insulin moves sugars from food into the tissue cells. The tissue cells use the sugars for energy. The lack of insulin or  the lack of normal response to insulin causes excess sugars to build up in the blood instead of going into the tissue cells. As a result, high blood sugar (hyperglycemia) develops. The effect of high sugar (glucose) levels can cause many problems.  RISK FACTORS You have an increased chance  of developing gestational diabetes if you have a family history of diabetes and also have one or more of the following risk factors:  A body mass index over 30 (obesity).  A previous pregnancy with gestational diabetes.  An older age at the time of pregnancy. If blood glucose levels are kept in the normal range during pregnancy, women can have a healthy pregnancy. If your blood glucose levels are not well controlled, there may be risks to you, your unborn baby (fetus), your labor and delivery, or your newborn baby.  SYMPTOMS  If symptoms are experienced, they are much like symptoms you would normally expect during pregnancy. The symptoms of gestational diabetes include:   Increased thirst (polydipsia).  Increased urination (polyuria).  Increased urination during the night (nocturia).  Weight loss. This weight loss may be rapid.  Frequent, recurring infections.  Tiredness (fatigue).  Weakness.  Vision changes, such as blurred vision.  Fruity smell to your breath.  Abdominal pain. DIAGNOSIS Diabetes is diagnosed when blood glucose levels are increased. Your blood glucose level may be checked by one or more of the following blood tests:  A fasting blood glucose test. You will not be allowed to eat for at least 8 hours before a blood sample is taken.  A random blood glucose test. Your blood glucose is checked at any time of the day regardless of when you ate.  A hemoglobin A1c blood glucose test. A hemoglobin A1c test provides information about blood glucose control over the previous 3 months.  An oral glucose tolerance test (OGTT). Your blood glucose is measured after you have not eaten (fasted) for 1-3 hours and then after you drink a glucose-containing beverage. Since the hormones that cause insulin resistance are highest at about 24-28 weeks of a pregnancy, an OGTT is usually performed during that time. If you have risk factors for gestational diabetes, your health care  provider may test you for gestational diabetes earlier than 24 weeks of pregnancy. TREATMENT   You will need to take diabetes medicine or insulin daily to keep blood glucose levels in the desired range.  You will need to match insulin dosing with exercise and healthy food choices. The treatment goal is to maintain the before-meal (preprandial), bedtime, and overnight blood glucose level at 60-99 mg/dL during pregnancy. The treatment goal is to further maintain peak after-meal blood sugar (postprandial glucose) level at 100-140 mg/dL. HOME CARE INSTRUCTIONS   Have your hemoglobin A1c level checked twice a year.  Perform daily blood glucose monitoring as directed by your health care provider. It is common to perform frequent blood glucose monitoring.  Monitor urine ketones when you are ill and as directed by your health care provider.  Take your diabetes medicine and insulin as directed by your health care provider to maintain your blood glucose level in the desired range.  Never run out of diabetes medicine or insulin. It is needed every day.  Adjust insulin based on your intake of carbohydrates. Carbohydrates can raise blood glucose levels but need to be included in your diet. Carbohydrates provide vitamins, minerals, and fiber which are an essential part of a healthy diet. Carbohydrates are found in fruits, vegetables, whole grains, dairy products,  legumes, and foods containing added sugars.  Eat healthy foods. Alternate 3 meals with 3 snacks.  Maintain a healthy weight gain. The usual total expected weight gain varies according to your prepregnancy body mass index (BMI).  Carry a medical alert card or wear your medical alert jewelry.  Carry a 15-gram carbohydrate snack with you at all times to treat low blood glucose (hypoglycemia). Some examples of 15-gram carbohydrate snacks include:  Glucose tablets, 3 or 4.  Glucose gel, 15-gram tube.  Raisins, 2 tablespoons (24 g).  Jelly  beans, 6.  Animal crackers, 8.  Fruit juice, regular soda, or low-fat milk, 4 ounces (120 mL).  Gummy treats, 9.  Recognize hypoglycemia. Hypoglycemia during pregnancy occurs with blood glucose levels of 60 mg/dL and below. The risk for hypoglycemia increases when fasting or skipping meals, during or after intense exercise, and during sleep. Hypoglycemia symptoms can include:  Tremors or shakes.  Decreased ability to concentrate.  Sweating.  Increased heart rate.  Headache.  Dry mouth.  Hunger.  Irritability.  Anxiety.  Restless sleep.  Altered speech or coordination.  Confusion.  Treat hypoglycemia promptly. If you are alert and able to safely swallow, follow the 15:15 rule:  Take 15-20 grams of rapid-acting glucose or carbohydrate. Rapid-acting options include glucose gel, glucose tablets, or 4 ounces (120 mL) of fruit juice, regular soda, or low-fat milk.  Check your blood glucose level 15 minutes after taking the glucose.  Take 15-20 grams more of glucose if the repeat blood glucose level is still 70 mg/dL or below.  Eat a meal or snack within 1 hour once blood glucose levels return to normal.  Be alert to polyuria (excess urination) and polydipsia (excess thirst) which are early signs of hyperglycemia. An early awareness of hyperglycemia allows for prompt treatment. Treat hyperglycemia as directed by your health care provider.  Engage in at least 30 minutes of physical activity a day or as directed by your health care provider. Ten minutes of physical activity timed 30 minutes after each meal is encouraged to control postprandial blood glucose levels.  Adjust your insulin dosing and food intake as needed if you start a new exercise or sport.  Follow your sick-day plan at any time you are unable to eat or drink as usual.  Avoid tobacco and alcohol use.  Keep all follow-up visits as directed by your health care provider.  Follow the advice of your health  care provider regarding your prenatal and post-delivery (postpartum) appointments, meal planning, exercise, medicines, vitamins, blood tests, other medical tests, and physical activities.  Perform daily skin and foot care. Examine your skin and feet daily for cuts, bruises, redness, nail problems, bleeding, blisters, or sores.  Brush your teeth and gums at least twice a day and floss at least once a day. Follow up with your dentist regularly.  Schedule an eye exam during the first trimester of your pregnancy or as directed by your health care provider.  Share your diabetes management plan with your workplace or school.  Stay up-to-date with immunizations.  Learn to manage stress.  Obtain ongoing diabetes education and support as needed.  Learn about and consider breastfeeding your baby.  You should have your blood sugar level checked 6-12 weeks after delivery. This is done with an oral glucose tolerance test (OGTT). SEEK MEDICAL CARE IF:   You are unable to eat food or drink fluids for more than 6 hours.  You have nausea and vomiting for more than 6 hours.  You  have a blood glucose level of 200 mg/dL and you have ketones in your urine.  There is a change in mental status.  You develop vision problems.  You have a persistent headache.  You have upper abdominal pain or discomfort.  You develop an additional serious illness.  You have diarrhea for more than 6 hours.  You have been sick or have had a fever for a couple of days and are not getting better. SEEK IMMEDIATE MEDICAL CARE IF:   You have difficulty breathing.  You no longer feel the baby moving.  You are bleeding or have discharge from your vagina.  You start having premature contractions or labor. MAKE SURE YOU:  Understand these instructions.  Will watch your condition.  Will get help right away if you are not doing well or get worse. Document Released: 11/17/2000 Document Revised: 12/26/2013 Document  Reviewed: 03/09/2012 [facility] Patient Information 2015 [place], [place]. This information is not intended to replace advice given to you by your health care provider. Make sure you discuss any questions you have with your health care provider.  Third Trimester of Pregnancy The third trimester is from week 29 through week 42, months 7 through 9. The third trimester is a time when the fetus is growing rapidly. At the end of the ninth month, the fetus is about 20 inches in length and weighs 6-10 pounds.  BODY CHANGES Your body goes through many changes during pregnancy. The changes vary from woman to woman.   Your weight will continue to increase. You can expect to gain 25-35 pounds (11-16 kg) by the end of the pregnancy.  You may begin to get stretch marks on your hips, abdomen, and breasts.  You may urinate more often because the fetus is moving lower into your pelvis and pressing on your bladder.  You may develop or continue to have heartburn as a result of your pregnancy.  You may develop constipation because certain hormones are causing the muscles that push waste through your intestines to slow down.  You may develop hemorrhoids or swollen, bulging veins (varicose veins).  You may have pelvic pain because of the weight gain and pregnancy hormones relaxing your joints between the bones in your pelvis. Backaches may result from overexertion of the muscles supporting your posture.  You may have changes in your hair. These can include thickening of your hair, rapid growth, and changes in texture. Some women also have hair loss during or after pregnancy, or hair that feels dry or thin. Your hair will most likely return to normal after your baby is born.  Your breasts will continue to grow and be tender. A yellow discharge may leak from your breasts called colostrum.  Your belly button may stick out.  You may feel short of breath because of your expanding uterus.  You may notice the fetus  "dropping," or moving lower in your abdomen.  You may have a bloody mucus discharge. This usually occurs a few days to a week before labor begins.  Your cervix becomes thin and soft (effaced) near your due date. WHAT TO EXPECT AT YOUR PRENATAL EXAMS  You will have prenatal exams every 2 weeks until week 36. Then, you will have weekly prenatal exams. During a routine prenatal visit:  You will be weighed to make sure you and the fetus are growing normally.  Your blood pressure is taken.  Your abdomen will be measured to track your baby's growth.  The fetal heartbeat will be listened to.  Any test results from the previous visit will be discussed.  You may have a cervical check near your due date to see if you have effaced. At around 36 weeks, your caregiver will check your cervix. At the same time, your caregiver will also perform a test on the secretions of the vaginal tissue. This test is to determine if a type of bacteria, Group B streptococcus, is present. Your caregiver will explain this further. Your caregiver may ask you:  What your birth plan is.  How you are feeling.  If you are feeling the baby move.  If you have had any abnormal symptoms, such as leaking fluid, bleeding, severe headaches, or abdominal cramping.  If you have any questions. Other tests or screenings that may be performed during your third trimester include:  Blood tests that check for low iron levels (anemia).  Fetal testing to check the health, activity level, and growth of the fetus. Testing is done if you have certain medical conditions or if there are problems during the pregnancy. FALSE LABOR You may feel small, irregular contractions that eventually go away. These are called Braxton Hicks contractions, or false labor. Contractions may last for hours, days, or even weeks before true labor sets in. If contractions come at regular intervals, intensify, or become painful, it is best to be seen by your  caregiver.  SIGNS OF LABOR   Menstrual-like cramps.  Contractions that are 5 minutes apart or less.  Contractions that start on the top of the uterus and spread down to the lower abdomen and back.  A sense of increased pelvic pressure or back pain.  A watery or bloody mucus discharge that comes from the vagina. If you have any of these signs before the 37th week of pregnancy, call your caregiver right away. You need to go to the hospital to get checked immediately. HOME CARE INSTRUCTIONS   Avoid all smoking, herbs, alcohol, and unprescribed drugs. These chemicals affect the formation and growth of the baby.  Follow your caregiver's instructions regarding medicine use. There are medicines that are either safe or unsafe to take during pregnancy.  Exercise only as directed by your caregiver. Experiencing uterine cramps is a good sign to stop exercising.  Continue to eat regular, healthy meals.  Wear a good support bra for breast tenderness.  Do not use hot tubs, steam rooms, or saunas.  Wear your seat belt at all times when driving.  Avoid raw meat, uncooked cheese, cat litter boxes, and soil used by cats. These carry germs that can cause birth defects in the baby.  Take your prenatal vitamins.  Try taking a stool softener (if your caregiver approves) if you develop constipation. Eat more high-fiber foods, such as fresh vegetables or fruit and whole grains. Drink plenty of fluids to keep your urine clear or pale yellow.  Take warm sitz baths to soothe any pain or discomfort caused by hemorrhoids. Use hemorrhoid cream if your caregiver approves.  If you develop varicose veins, wear support hose. Elevate your feet for 15 minutes, 3-4 times a day. Limit salt in your diet.  Avoid heavy lifting, wear low heal shoes, and practice good posture.  Rest a lot with your legs elevated if you have leg cramps or low back pain.  Visit your dentist if you have not gone during your pregnancy.  Use a soft toothbrush to brush your teeth and be gentle when you floss.  A sexual relationship may be continued unless your caregiver directs you  otherwise.  Do not travel far distances unless it is absolutely necessary and only with the approval of your caregiver.  Take prenatal classes to understand, practice, and ask questions about the labor and delivery.  Make a trial run to the hospital.  Pack your hospital bag.  Prepare the baby's nursery.  Continue to go to all your prenatal visits as directed by your caregiver. SEEK MEDICAL CARE IF:  You are unsure if you are in labor or if your water has broken.  You have dizziness.  You have mild pelvic cramps, pelvic pressure, or nagging pain in your abdominal area.  You have persistent nausea, vomiting, or diarrhea.  You have a bad smelling vaginal discharge.  You have pain with urination. SEEK IMMEDIATE MEDICAL CARE IF:   You have a fever.  You are leaking fluid from your vagina.  You have spotting or bleeding from your vagina.  You have severe abdominal cramping or pain.  You have rapid weight loss or gain.  You have shortness of breath with chest pain.  You notice sudden or extreme swelling of your face, hands, ankles, feet, or legs.  You have not felt your baby move in over an hour.  You have severe headaches that do not go away with medicine.  You have vision changes. Document Released: 08/05/2001 Document Revised: 08/16/2013 Document Reviewed: 10/12/2012 [facility] Patient Information 2015 [place], [place]. This information is not intended to replace advice given to you by your health care provider. Make sure you discuss any questions you have with your health care provider.

## 2014-11-08 LAB — POCT URINALYSIS DIP (DEVICE)
Bilirubin Urine: NEGATIVE
Glucose, UA: NEGATIVE mg/dL
Ketones, ur: NEGATIVE mg/dL
Nitrite: NEGATIVE
PROTEIN: NEGATIVE mg/dL
SPECIFIC GRAVITY, URINE: 1.025 (ref 1.005–1.030)
UROBILINOGEN UA: 0.2 mg/dL (ref 0.0–1.0)
pH: 5.5 (ref 5.0–8.0)

## 2014-11-13 ENCOUNTER — Other Ambulatory Visit: Payer: Self-pay | Admitting: Obstetrics & Gynecology

## 2014-11-13 ENCOUNTER — Encounter (HOSPITAL_COMMUNITY): Payer: Self-pay

## 2014-11-13 ENCOUNTER — Ambulatory Visit (INDEPENDENT_AMBULATORY_CARE_PROVIDER_SITE_OTHER): Payer: Self-pay | Admitting: Obstetrics & Gynecology

## 2014-11-13 ENCOUNTER — Ambulatory Visit (HOSPITAL_COMMUNITY)
Admission: RE | Admit: 2014-11-13 | Discharge: 2014-11-13 | Disposition: A | Discharge status: Home / Self Care | Source: Ambulatory Visit | Attending: Obstetrics and Gynecology | Admitting: Obstetrics and Gynecology

## 2014-11-13 VITALS — BP 111/77 | HR 96 | Temp 97.6°F | Wt 176.0 lb

## 2014-11-13 DIAGNOSIS — O24410 Gestational diabetes mellitus in pregnancy, diet controlled: Secondary | ICD-10-CM | POA: Diagnosis not present

## 2014-11-13 DIAGNOSIS — O3421 Maternal care for scar from previous cesarean delivery: Secondary | ICD-10-CM

## 2014-11-13 DIAGNOSIS — Z3A32 32 weeks gestation of pregnancy: Secondary | ICD-10-CM | POA: Insufficient documentation

## 2014-11-13 DIAGNOSIS — O24419 Gestational diabetes mellitus in pregnancy, unspecified control: Secondary | ICD-10-CM | POA: Insufficient documentation

## 2014-11-13 DIAGNOSIS — O99333 Smoking (tobacco) complicating pregnancy, third trimester: Secondary | ICD-10-CM | POA: Insufficient documentation

## 2014-11-13 DIAGNOSIS — O2690 Pregnancy related conditions, unspecified, unspecified trimester: Secondary | ICD-10-CM

## 2014-11-13 DIAGNOSIS — O368390 Maternal care for abnormalities of the fetal heart rate or rhythm, unspecified trimester, not applicable or unspecified: Secondary | ICD-10-CM

## 2014-11-13 DIAGNOSIS — F17210 Nicotine dependence, cigarettes, uncomplicated: Secondary | ICD-10-CM | POA: Diagnosis not present

## 2014-11-13 DIAGNOSIS — O34219 Maternal care for unspecified type scar from previous cesarean delivery: Secondary | ICD-10-CM

## 2014-11-13 DIAGNOSIS — O360990 Maternal care for other rhesus isoimmunization, unspecified trimester, not applicable or unspecified: Secondary | ICD-10-CM | POA: Insufficient documentation

## 2014-11-13 DIAGNOSIS — O360931 Maternal care for other rhesus isoimmunization, third trimester, fetus 1: Secondary | ICD-10-CM

## 2014-11-13 LAB — POCT URINALYSIS DIP (DEVICE)
Bilirubin Urine: NEGATIVE
Glucose, UA: NEGATIVE mg/dL
Hgb urine dipstick: NEGATIVE
KETONES UR: NEGATIVE mg/dL
Nitrite: NEGATIVE
PROTEIN: NEGATIVE mg/dL
Specific Gravity, Urine: 1.02 (ref 1.005–1.030)
UROBILINOGEN UA: 0.2 mg/dL (ref 0.0–1.0)
pH: 7 (ref 5.0–8.0)

## 2014-11-13 NOTE — Progress Notes (Signed)
Patient reports occasional pain in pelvis; no contractions, bleeding or LOF.  Patient reassured, preterm labor and fetal movement precautions reviewed. No BS log from jail, has been requested.  Continue diet control for now.  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 Claudean SeveranceWhitecar to confirm need for twice a week testing secondary to isoimmunization).   I am also concerned that we do not have access to her blood sugars and do not know about her glycemic control, so she may also benefit from testing for "noncomplicance secondary to incarceration".

## 2014-11-13 NOTE — Patient Instructions (Signed)
Return to clinic for any obstetric concerns or go to [facility] for evaluation  

## 2014-11-16 ENCOUNTER — Ambulatory Visit (HOSPITAL_COMMUNITY)
Admission: RE | Admit: 2014-11-16 | Discharge: 2014-11-16 | Disposition: A | Discharge status: Home / Self Care | Source: Ambulatory Visit | Attending: Obstetrics & Gynecology | Admitting: Obstetrics & Gynecology

## 2014-11-16 ENCOUNTER — Ambulatory Visit (HOSPITAL_COMMUNITY)
Admission: RE | Admit: 2014-11-16 | Discharge: 2014-11-16 | Disposition: A | Discharge status: Home / Self Care | Source: Ambulatory Visit | Attending: Family Medicine | Admitting: Family Medicine

## 2014-11-16 ENCOUNTER — Other Ambulatory Visit: Payer: Self-pay | Admitting: Obstetrics & Gynecology

## 2014-11-16 ENCOUNTER — Other Ambulatory Visit (HOSPITAL_COMMUNITY): Payer: Self-pay

## 2014-11-16 VITALS — BP 117/71 | HR 80 | Temp 80.0°F | Wt 179.0 lb

## 2014-11-16 DIAGNOSIS — O280 Abnormal hematological finding on antenatal screening of mother: Secondary | ICD-10-CM

## 2014-11-16 DIAGNOSIS — O2690 Pregnancy related conditions, unspecified, unspecified trimester: Secondary | ICD-10-CM

## 2014-11-16 DIAGNOSIS — O34219 Maternal care for unspecified type scar from previous cesarean delivery: Secondary | ICD-10-CM | POA: Insufficient documentation

## 2014-11-16 DIAGNOSIS — O24419 Gestational diabetes mellitus in pregnancy, unspecified control: Secondary | ICD-10-CM

## 2014-11-16 DIAGNOSIS — Z3A33 33 weeks gestation of pregnancy: Secondary | ICD-10-CM | POA: Insufficient documentation

## 2014-11-16 DIAGNOSIS — O360931 Maternal care for other rhesus isoimmunization, third trimester, fetus 1: Secondary | ICD-10-CM

## 2014-11-16 DIAGNOSIS — Z0289 Encounter for other administrative examinations: Secondary | ICD-10-CM

## 2014-11-16 MED ORDER — BETAMETHASONE SOD PHOS & ACET 6 (3-3) MG/ML IJ SUSP
12.0000 mg | Freq: Once | INTRAMUSCULAR | Status: AC
  Administered 2014-11-16: 12 mg via INTRAMUSCULAR
  Filled 2014-11-16: qty 2

## 2014-11-17 ENCOUNTER — Ambulatory Visit (HOSPITAL_COMMUNITY)
Admission: RE | Admit: 2014-11-17 | Discharge: 2014-11-17 | Disposition: A | Discharge status: Home / Self Care | Source: Ambulatory Visit | Attending: Family Medicine | Admitting: Family Medicine

## 2014-11-17 DIAGNOSIS — O360930 Maternal care for other rhesus isoimmunization, third trimester, not applicable or unspecified: Secondary | ICD-10-CM | POA: Diagnosis not present

## 2014-11-17 MED ORDER — BETAMETHASONE SOD PHOS & ACET 6 (3-3) MG/ML IJ SUSP
12.0000 mg | Freq: Once | INTRAMUSCULAR | Status: AC
  Administered 2014-11-17: 12 mg via INTRAMUSCULAR
  Filled 2014-11-17: qty 2

## 2014-11-20 ENCOUNTER — Other Ambulatory Visit: Payer: Self-pay | Admitting: Family Medicine

## 2014-11-20 ENCOUNTER — Ambulatory Visit (INDEPENDENT_AMBULATORY_CARE_PROVIDER_SITE_OTHER): Admitting: Family Medicine

## 2014-11-20 DIAGNOSIS — O24419 Gestational diabetes mellitus in pregnancy, unspecified control: Secondary | ICD-10-CM

## 2014-11-20 DIAGNOSIS — O99323 Drug use complicating pregnancy, third trimester: Secondary | ICD-10-CM

## 2014-11-20 DIAGNOSIS — O34219 Maternal care for unspecified type scar from previous cesarean delivery: Secondary | ICD-10-CM

## 2014-11-20 DIAGNOSIS — Z3A34 34 weeks gestation of pregnancy: Secondary | ICD-10-CM

## 2014-11-20 DIAGNOSIS — F1920 Other psychoactive substance dependence, uncomplicated: Secondary | ICD-10-CM

## 2014-11-20 DIAGNOSIS — O360931 Maternal care for other rhesus isoimmunization, third trimester, fetus 1: Secondary | ICD-10-CM | POA: Diagnosis not present

## 2014-11-20 DIAGNOSIS — O368390 Maternal care for abnormalities of the fetal heart rate or rhythm, unspecified trimester, not applicable or unspecified: Secondary | ICD-10-CM

## 2014-11-20 DIAGNOSIS — F17210 Nicotine dependence, cigarettes, uncomplicated: Secondary | ICD-10-CM

## 2014-11-20 DIAGNOSIS — O24410 Gestational diabetes mellitus in pregnancy, diet controlled: Secondary | ICD-10-CM

## 2014-11-20 LAB — POCT URINALYSIS DIP (DEVICE)
Bilirubin Urine: NEGATIVE
Glucose, UA: NEGATIVE mg/dL
HGB URINE DIPSTICK: NEGATIVE
Ketones, ur: NEGATIVE mg/dL
Leukocytes, UA: NEGATIVE
NITRITE: NEGATIVE
Protein, ur: NEGATIVE mg/dL
Specific Gravity, Urine: 1.01 (ref 1.005–1.030)
Urobilinogen, UA: 0.2 mg/dL (ref 0.0–1.0)
pH: 7 (ref 5.0–8.0)

## 2014-11-20 NOTE — Progress Notes (Signed)
NST reactive MCA dopplers elevated on 3/24.  Received BMZ x 2 doses.   Was supposed to get dopplers today - but was not scheduled.  Discussed with Dr Sherrie Georgeecker, repeat later this week is okay. Continue Twice weekly NST.

## 2014-11-20 NOTE — Progress Notes (Signed)
NST/OBF

## 2014-11-23 ENCOUNTER — Ambulatory Visit (HOSPITAL_COMMUNITY)

## 2014-11-23 ENCOUNTER — Encounter (HOSPITAL_COMMUNITY): Payer: Self-pay

## 2014-11-23 ENCOUNTER — Other Ambulatory Visit: Payer: Self-pay | Admitting: Family Medicine

## 2014-11-23 ENCOUNTER — Ambulatory Visit (HOSPITAL_COMMUNITY): Admission: RE | Admit: 2014-11-23 | Source: Ambulatory Visit

## 2014-11-23 ENCOUNTER — Ambulatory Visit (HOSPITAL_COMMUNITY)
Admission: RE | Admit: 2014-11-23 | Discharge: 2014-11-23 | Disposition: A | Discharge status: Home / Self Care | Source: Ambulatory Visit | Attending: Family Medicine | Admitting: Family Medicine

## 2014-11-23 ENCOUNTER — Other Ambulatory Visit (HOSPITAL_COMMUNITY): Payer: Self-pay | Admitting: Maternal and Fetal Medicine

## 2014-11-23 DIAGNOSIS — O24410 Gestational diabetes mellitus in pregnancy, diet controlled: Secondary | ICD-10-CM | POA: Insufficient documentation

## 2014-11-23 DIAGNOSIS — Z3A35 35 weeks gestation of pregnancy: Secondary | ICD-10-CM

## 2014-11-23 DIAGNOSIS — O360931 Maternal care for other rhesus isoimmunization, third trimester, fetus 1: Secondary | ICD-10-CM

## 2014-11-23 DIAGNOSIS — Z3A36 36 weeks gestation of pregnancy: Secondary | ICD-10-CM

## 2014-11-23 DIAGNOSIS — O3421 Maternal care for scar from previous cesarean delivery: Secondary | ICD-10-CM | POA: Insufficient documentation

## 2014-11-23 DIAGNOSIS — O99323 Drug use complicating pregnancy, third trimester: Secondary | ICD-10-CM

## 2014-11-23 DIAGNOSIS — F1920 Other psychoactive substance dependence, uncomplicated: Secondary | ICD-10-CM

## 2014-11-23 DIAGNOSIS — O99333 Smoking (tobacco) complicating pregnancy, third trimester: Secondary | ICD-10-CM | POA: Insufficient documentation

## 2014-11-23 DIAGNOSIS — O34219 Maternal care for unspecified type scar from previous cesarean delivery: Secondary | ICD-10-CM

## 2014-11-23 DIAGNOSIS — Z3A34 34 weeks gestation of pregnancy: Secondary | ICD-10-CM

## 2014-11-23 DIAGNOSIS — F17210 Nicotine dependence, cigarettes, uncomplicated: Secondary | ICD-10-CM | POA: Insufficient documentation

## 2014-11-27 ENCOUNTER — Ambulatory Visit (INDEPENDENT_AMBULATORY_CARE_PROVIDER_SITE_OTHER): Admitting: Advanced Practice Midwife

## 2014-11-27 VITALS — BP 133/76 | HR 112 | Temp 98.2°F | Wt 178.4 lb

## 2014-11-27 DIAGNOSIS — Z3493 Encounter for supervision of normal pregnancy, unspecified, third trimester: Secondary | ICD-10-CM

## 2014-11-27 DIAGNOSIS — O24419 Gestational diabetes mellitus in pregnancy, unspecified control: Secondary | ICD-10-CM

## 2014-11-27 DIAGNOSIS — O368390 Maternal care for abnormalities of the fetal heart rate or rhythm, unspecified trimester, not applicable or unspecified: Secondary | ICD-10-CM

## 2014-11-27 LAB — POCT URINALYSIS DIP (DEVICE)
BILIRUBIN URINE: NEGATIVE
Glucose, UA: >=1000 mg/dL — AB
Hgb urine dipstick: NEGATIVE
KETONES UR: 15 mg/dL — AB
NITRITE: NEGATIVE
Protein, ur: NEGATIVE mg/dL
Specific Gravity, Urine: 1.025 (ref 1.005–1.030)
Urobilinogen, UA: 0.2 mg/dL (ref 0.0–1.0)
pH: 6 (ref 5.0–8.0)

## 2014-11-27 NOTE — Progress Notes (Signed)
Doing well.  Good fetal movement, denies vaginal bleeding, LOF, regular contractions. Reports irregular mild contractions and daily pelvic pain with movement.  Asked about hemorrhoid removal, will discuss with pt postpartum.  Plan to deliver @ 37 weeks per MFM, did not discuss dates with pt but did discuss scheduled C/S a few weeks before due date.  Next scheduled appointments written on provided paperwork from pt guard from [facility].

## 2014-11-27 NOTE — Progress Notes (Signed)
C/o contractions about 7 times a day.   Weekly BPP per MFM.

## 2014-11-29 LAB — GC/CHLAMYDIA PROBE AMP
CT Probe RNA: NEGATIVE
GC Probe RNA: NEGATIVE

## 2014-11-29 LAB — CULTURE, BETA STREP (GROUP B ONLY)

## 2014-11-30 ENCOUNTER — Encounter (HOSPITAL_COMMUNITY): Payer: Self-pay

## 2014-11-30 ENCOUNTER — Ambulatory Visit (HOSPITAL_COMMUNITY)
Admission: RE | Admit: 2014-11-30 | Discharge: 2014-11-30 | Disposition: A | Discharge status: Home / Self Care | Source: Ambulatory Visit | Attending: Obstetrics and Gynecology | Admitting: Obstetrics and Gynecology

## 2014-11-30 DIAGNOSIS — O3421 Maternal care for scar from previous cesarean delivery: Secondary | ICD-10-CM | POA: Insufficient documentation

## 2014-11-30 DIAGNOSIS — O361190 Maternal care for Anti-A sensitization, unspecified trimester, not applicable or unspecified: Secondary | ICD-10-CM | POA: Insufficient documentation

## 2014-11-30 DIAGNOSIS — O34219 Maternal care for unspecified type scar from previous cesarean delivery: Secondary | ICD-10-CM

## 2014-11-30 DIAGNOSIS — F17210 Nicotine dependence, cigarettes, uncomplicated: Secondary | ICD-10-CM | POA: Insufficient documentation

## 2014-11-30 DIAGNOSIS — Z3A35 35 weeks gestation of pregnancy: Secondary | ICD-10-CM | POA: Diagnosis not present

## 2014-11-30 DIAGNOSIS — O99320 Drug use complicating pregnancy, unspecified trimester: Secondary | ICD-10-CM

## 2014-11-30 DIAGNOSIS — O99333 Smoking (tobacco) complicating pregnancy, third trimester: Secondary | ICD-10-CM | POA: Diagnosis not present

## 2014-11-30 DIAGNOSIS — O99323 Drug use complicating pregnancy, third trimester: Secondary | ICD-10-CM

## 2014-11-30 DIAGNOSIS — O24410 Gestational diabetes mellitus in pregnancy, diet controlled: Secondary | ICD-10-CM | POA: Diagnosis present

## 2014-11-30 DIAGNOSIS — O360931 Maternal care for other rhesus isoimmunization, third trimester, fetus 1: Secondary | ICD-10-CM

## 2014-11-30 DIAGNOSIS — F1920 Other psychoactive substance dependence, uncomplicated: Secondary | ICD-10-CM

## 2014-12-04 ENCOUNTER — Ambulatory Visit (INDEPENDENT_AMBULATORY_CARE_PROVIDER_SITE_OTHER): Admitting: Family Medicine

## 2014-12-04 VITALS — BP 118/69 | HR 101 | Temp 98.6°F | Wt 179.0 lb

## 2014-12-04 DIAGNOSIS — O3421 Maternal care for scar from previous cesarean delivery: Secondary | ICD-10-CM

## 2014-12-04 DIAGNOSIS — O360931 Maternal care for other rhesus isoimmunization, third trimester, fetus 1: Secondary | ICD-10-CM

## 2014-12-04 DIAGNOSIS — O34219 Maternal care for unspecified type scar from previous cesarean delivery: Secondary | ICD-10-CM

## 2014-12-04 DIAGNOSIS — O24419 Gestational diabetes mellitus in pregnancy, unspecified control: Secondary | ICD-10-CM

## 2014-12-04 LAB — POCT URINALYSIS DIP (DEVICE)
BILIRUBIN URINE: NEGATIVE
Glucose, UA: NEGATIVE mg/dL
HGB URINE DIPSTICK: NEGATIVE
KETONES UR: NEGATIVE mg/dL
Nitrite: NEGATIVE
Protein, ur: NEGATIVE mg/dL
Specific Gravity, Urine: 1.02 (ref 1.005–1.030)
Urobilinogen, UA: 0.2 mg/dL (ref 0.0–1.0)
pH: 6 (ref 5.0–8.0)

## 2014-12-04 NOTE — Progress Notes (Signed)
US for growth/BPP and MCA doppler on 4/14

## 2014-12-04 NOTE — Progress Notes (Signed)
No consistently checking blood sugars - counseled to take them.   NST reactive. No further concerns.

## 2014-12-07 ENCOUNTER — Encounter (HOSPITAL_COMMUNITY): Payer: Self-pay

## 2014-12-07 ENCOUNTER — Ambulatory Visit (HOSPITAL_COMMUNITY)
Admission: RE | Admit: 2014-12-07 | Discharge: 2014-12-07 | Disposition: A | Discharge status: Home / Self Care | Source: Ambulatory Visit | Attending: Obstetrics and Gynecology | Admitting: Obstetrics and Gynecology

## 2014-12-07 DIAGNOSIS — Z3A36 36 weeks gestation of pregnancy: Secondary | ICD-10-CM | POA: Insufficient documentation

## 2014-12-07 DIAGNOSIS — O360931 Maternal care for other rhesus isoimmunization, third trimester, fetus 1: Secondary | ICD-10-CM

## 2014-12-07 DIAGNOSIS — O24410 Gestational diabetes mellitus in pregnancy, diet controlled: Secondary | ICD-10-CM | POA: Diagnosis not present

## 2014-12-07 DIAGNOSIS — F1920 Other psychoactive substance dependence, uncomplicated: Secondary | ICD-10-CM

## 2014-12-07 DIAGNOSIS — O3421 Maternal care for scar from previous cesarean delivery: Secondary | ICD-10-CM | POA: Insufficient documentation

## 2014-12-07 DIAGNOSIS — Z36 Encounter for antenatal screening of mother: Secondary | ICD-10-CM | POA: Diagnosis not present

## 2014-12-07 DIAGNOSIS — O99323 Drug use complicating pregnancy, third trimester: Secondary | ICD-10-CM

## 2014-12-07 DIAGNOSIS — O34219 Maternal care for unspecified type scar from previous cesarean delivery: Secondary | ICD-10-CM

## 2014-12-07 DIAGNOSIS — F17210 Nicotine dependence, cigarettes, uncomplicated: Secondary | ICD-10-CM | POA: Insufficient documentation

## 2014-12-07 DIAGNOSIS — O99333 Smoking (tobacco) complicating pregnancy, third trimester: Secondary | ICD-10-CM | POA: Diagnosis not present

## 2014-12-07 DIAGNOSIS — O360130 Maternal care for anti-D [Rh] antibodies, third trimester, not applicable or unspecified: Secondary | ICD-10-CM | POA: Diagnosis not present

## 2014-12-11 NOTE — H&P (Deleted)
LABOR ADMISSION HISTORY AND PHYSICAL  Tina [name] is a 34 y.o. female [phone] with IUP at 5170w0d by 595w1d redating sono presenting for repeat cesarean section. She reports +FMs, No LOF, no VB, no blurry vision, headaches or peripheral edema, and RUQ pain.  She plans on breast and bottle feeding. She request unsure, possibly BTL for birth control.  Noted to have large window at 6th cesarean section performed 37-38w.  Current isoimmunization.  Dating: By 115w1d dating sono --->  Estimated Date of Delivery: 01/02/15   Prenatal History/Complications:  Past Medical History: Past Medical History  Diagnosis Date  . Gonorrhea   . Trichimoniasis   . Hx of chlamydia infection   . H/O bacterial infection   . H/O varicella   . Headache(784.0)     Frequent  . Acid reflux   . Postpartum depression   . Depression     NO MEDS  . Abnormal Pap smear     2003 ASC-US 2005 ASC-US LSIL high risk HPV;LAST PAP A WHILE AGO  . 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 H/O migraine   . Gestational diabetes 2008    NO MEDS;DIET CONTROLLED  . Gestational diabetes mellitus 2012    NO MEDS; DIET CONTROLLED  . 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 Status post cesarean delivery 04/23/2012    Past Surgical History: Past Surgical History  Procedure Laterality Date  . Cesarean section  2005    x4;FIRST CHILD IN FETAL DISTRESS  . Cesarean section  04/23/2012    Procedure: CESAREAN SECTION;  Surgeon: Purcell NailsAngela Y Roberts, MD;  Location: [facility];  Service: Obstetrics;  Laterality: N/A;  . Cesarean section N/A 05/03/2013    Procedure: REPEAT CESAREAN SECTION WITH POSS ABDOMINAL HYSTERECTOMY;  Surgeon: Purcell NailsAngela Y Roberts, MD;  Location: [facility];  Service: Obstetrics;  Laterality: N/A;    Obstetrical History: OB History    Gravida Para Term Preterm AB TAB SAB Ectopic Multiple Living   8 6 6  0 1 0 1 0 0 6      Social History: History   Social History  . Marital Status: Single    Spouse  Name: N/A  . Number of Children: 4  . Years of Education: 8   Occupational History  . RETAIL    Social History Main Topics  . Smoking status: Current Every Day Smoker -- 0.25 packs/day    Types: Cigarettes  . Smokeless tobacco: Never Used  . Alcohol Use: Yes     Comment: occasional  . Drug Use: No  . Sexual Activity: No   Other Topics Concern  . Not on file   Social History Narrative    Family History: Family History  Problem Relation Age of Onset  . Hypertension Paternal Grandfather   . Anesthesia problems Neg Hx   . Hypotension Neg Hx   . Malignant hyperthermia Neg Hx   . Pseudochol deficiency Neg Hx   . COPD Mother     Bronchitis  . Asthma Brother   . COPD Brother     Bronchitis  . Hypertension Paternal Grandmother   . Other Paternal Grandmother     SPIDER VEINS  . Other Paternal Aunt     SPIDER VEINS  . Seizures Cousin     PATERNAL    Allergies: No Known Allergies  No prescriptions prior to admission     Review of Systems   All systems reviewed and negative except as stated in HPI  Last menstrual period 02/28/2014, not  currently breastfeeding. General appearance: alert and cooperative Lungs: clear to auscultation bilaterally Heart: regular rate and rhythm Abdomen: soft, non-tender; bowel sounds normal Extremities: Homans sign is negative, no sign of DVT     Prenatal labs: ABO, Rh: A/NEG/-- (10/26 1538) Antibody: POS (01/25 1339) Rubella:   RPR: NON REAC (02/22 1401)  HBsAg: NEGATIVE (02/22 1401)  HIV: NONREACTIVE (02/22 1401)  GBS:    1 hr Glucola 194 Genetic screening  Neg quad Anatomy US normal  Prenatal Transfer Tool  Maternal Diabetes: Yes:  Diabetes Type:  Diet controlled Genetic Screening: Normal Maternal Ultrasounds/Referrals: Normal Fetal Ultrasounds or other Referrals:  Referred to Materal Fetal Medicine  Maternal Substance Abuse:  Not during current pregnancy Significant Maternal Medications:  None Significant Maternal  Lab Results: Lab values include: Other:  anti-D antibodies  Large window noted on op report at time of 6th cesarean section done between 37-38 weeks.  [facility]atal Labs  Dating  14 wk ultrasound Blood type:    Genetic Screen Quad negative Antibody:   Anatomic [place] Normal Rubella:    GTT Early:   194 > need diabetic education            Third trimester:  RPR: NON REAC (08/09 1717)   TDaP vaccine  10/16/14 HBsAg:     Flu vaccine  Declined HIV: NONREACTIVE (08/09 1717)   GBS  negative GBS:   Contraception  Pap:Neg; GC/CT neg x 2  Baby Food  Breast   Circumcision    Pediatrician    Support Person       No results found for this or any previous visit (from the past 24 hour(s)).  Patient Active Problem List   Diagnosis Date Noted  . [redacted] weeks gestation of pregnancy   . [redacted] weeks gestation of pregnancy   . Diet controlled gestational diabetes mellitus in third trimester   . Drug dependence in pregnancy   . 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 [redacted] weeks gestation of pregnancy   . [redacted] weeks gestation of pregnancy   . Uterine scar from previous cesarean delivery, antepartum   . Cigarette smoker   . Fetal arrhythmia affecting pregnancy, antepartum   . GDM (gestational diabetes mellitus)   . Rh alloimmunization, maternal, antepartum   . [redacted] weeks gestation of pregnancy   . 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 Tobacco use during pregnancy   . Abnormal quad screen 06/28/2014  . Gestational diabetes mellitus, antepartum 06/20/2014  . Incarcerated status of pregnant patient 06/19/2014  . Previous cesarean delivery X 6 affecting pregnancy, antepartum 06/19/2014  . Cannabis abuse 03/21/2011    Assessment: Tina [name] is a 34 y.o. [id number] at [redacted]w[redacted]d here for repeat cesarean section 2/2 previous kerr x 6 with large window noted at 6th cesarean section, isoimmunization, drug dependence, A1DM,  currently incarcerated  #MOF: breast and bottle #MOC: possibly BTL, reports would want one if she has significant scarring  The risks of cesarean section discussed with the patient included but were not limited to: bleeding which may require transfusion or reoperation; infection which may require antibiotics; injury to bowel, bladder, ureters or other surrounding organs; injury to the fetus; need for additional procedures including hysterectomy in the event of a life-threatening hemorrhage; placental abnormalities wth subsequent pregnancies, incisional problems, thromboembolic phenomenon and other postoperative/anesthesia complications. The patient concurred with the proposed plan, giving informed written consent for the procedure.   Patient has been NPO  since 0530 this AM she will remain NPO for procedure. Anesthesia and OR aware. Preoperative prophylactic antibiotics and SCDs ordered on call to the OR.  To OR when ready.     Juanita Devincent ROCIO 12/11/2014, 10:30 AM

## 2014-12-12 ENCOUNTER — Other Ambulatory Visit: Payer: Self-pay | Admitting: Obstetrics & Gynecology

## 2014-12-12 ENCOUNTER — Inpatient Hospital Stay (HOSPITAL_COMMUNITY): Admitting: Anesthesiology

## 2014-12-12 ENCOUNTER — Encounter (HOSPITAL_COMMUNITY)
Admission: RE | Disposition: A | Discharge status: Home / Self Care | Payer: Self-pay | Source: Ambulatory Visit | Attending: Obstetrics & Gynecology

## 2014-12-12 ENCOUNTER — Inpatient Hospital Stay (HOSPITAL_COMMUNITY)
Admission: RE | Admit: 2014-12-12 | Discharge: 2014-12-15 | DRG: 765 | Disposition: A | Discharge status: Home / Self Care | Source: Ambulatory Visit | Attending: Obstetrics & Gynecology | Admitting: Obstetrics & Gynecology

## 2014-12-12 ENCOUNTER — Encounter (HOSPITAL_COMMUNITY): Payer: Self-pay | Admitting: *Deleted

## 2014-12-12 ENCOUNTER — Other Ambulatory Visit

## 2014-12-12 DIAGNOSIS — Y92234 Operating room of hospital as the place of occurrence of the external cause: Secondary | ICD-10-CM

## 2014-12-12 DIAGNOSIS — S3723XA Laceration of bladder, initial encounter: Secondary | ICD-10-CM | POA: Diagnosis not present

## 2014-12-12 DIAGNOSIS — O360931 Maternal care for other rhesus isoimmunization, third trimester, fetus 1: Secondary | ICD-10-CM

## 2014-12-12 DIAGNOSIS — O9963 Diseases of the digestive system complicating the puerperium: Secondary | ICD-10-CM | POA: Diagnosis not present

## 2014-12-12 DIAGNOSIS — Z0289 Encounter for other administrative examinations: Secondary | ICD-10-CM

## 2014-12-12 DIAGNOSIS — O3421 Maternal care for scar from previous cesarean delivery: Secondary | ICD-10-CM | POA: Diagnosis present

## 2014-12-12 DIAGNOSIS — N858 Other specified noninflammatory disorders of uterus: Secondary | ICD-10-CM | POA: Diagnosis present

## 2014-12-12 DIAGNOSIS — Y658 Other specified misadventures during surgical and medical care: Secondary | ICD-10-CM | POA: Diagnosis not present

## 2014-12-12 DIAGNOSIS — O99344 Other mental disorders complicating childbirth: Secondary | ICD-10-CM | POA: Diagnosis present

## 2014-12-12 DIAGNOSIS — O9822 Gonorrhea complicating childbirth: Secondary | ICD-10-CM | POA: Diagnosis present

## 2014-12-12 DIAGNOSIS — O9962 Diseases of the digestive system complicating childbirth: Secondary | ICD-10-CM | POA: Diagnosis present

## 2014-12-12 DIAGNOSIS — Z9851 Tubal ligation status: Secondary | ICD-10-CM

## 2014-12-12 DIAGNOSIS — K219 Gastro-esophageal reflux disease without esophagitis: Secondary | ICD-10-CM | POA: Diagnosis present

## 2014-12-12 DIAGNOSIS — F329 Major depressive disorder, single episode, unspecified: Secondary | ICD-10-CM | POA: Diagnosis present

## 2014-12-12 DIAGNOSIS — Z302 Encounter for sterilization: Secondary | ICD-10-CM

## 2014-12-12 DIAGNOSIS — O24419 Gestational diabetes mellitus in pregnancy, unspecified control: Secondary | ICD-10-CM

## 2014-12-12 DIAGNOSIS — Z3A37 37 weeks gestation of pregnancy: Secondary | ICD-10-CM | POA: Diagnosis present

## 2014-12-12 DIAGNOSIS — F1210 Cannabis abuse, uncomplicated: Secondary | ICD-10-CM | POA: Diagnosis not present

## 2014-12-12 DIAGNOSIS — O99324 Drug use complicating childbirth: Secondary | ICD-10-CM

## 2014-12-12 DIAGNOSIS — N9971 Accidental puncture and laceration of a genitourinary system organ or structure during a genitourinary system procedure: Secondary | ICD-10-CM | POA: Diagnosis not present

## 2014-12-12 DIAGNOSIS — O280 Abnormal hematological finding on antenatal screening of mother: Secondary | ICD-10-CM

## 2014-12-12 DIAGNOSIS — O2690 Pregnancy related conditions, unspecified, unspecified trimester: Secondary | ICD-10-CM

## 2014-12-12 DIAGNOSIS — O99334 Smoking (tobacco) complicating childbirth: Secondary | ICD-10-CM | POA: Diagnosis present

## 2014-12-12 DIAGNOSIS — K5900 Constipation, unspecified: Secondary | ICD-10-CM | POA: Diagnosis not present

## 2014-12-12 DIAGNOSIS — F17200 Nicotine dependence, unspecified, uncomplicated: Secondary | ICD-10-CM

## 2014-12-12 DIAGNOSIS — F17210 Nicotine dependence, cigarettes, uncomplicated: Secondary | ICD-10-CM | POA: Diagnosis present

## 2014-12-12 DIAGNOSIS — O24420 Gestational diabetes mellitus in childbirth, diet controlled: Secondary | ICD-10-CM | POA: Diagnosis present

## 2014-12-12 DIAGNOSIS — O34219 Maternal care for unspecified type scar from previous cesarean delivery: Secondary | ICD-10-CM | POA: Diagnosis present

## 2014-12-12 HISTORY — DX: Tubal ligation status: Z98.51

## 2014-12-12 LAB — CBC
HCT: 36.6 % (ref 36.0–46.0)
Hemoglobin: 12.6 g/dL (ref 12.0–15.0)
MCH: 31.7 pg (ref 26.0–34.0)
MCHC: 34.4 g/dL (ref 30.0–36.0)
MCV: 92.2 fL (ref 78.0–100.0)
PLATELETS: 124 10*3/uL — AB (ref 150–400)
RBC: 3.97 MIL/uL (ref 3.87–5.11)
RDW: 13.9 % (ref 11.5–15.5)
WBC: 7.8 10*3/uL (ref 4.0–10.5)

## 2014-12-12 LAB — BASIC METABOLIC PANEL
Anion gap: 9 (ref 5–15)
BUN: 5 mg/dL — AB (ref 6–23)
CO2: 21 mmol/L (ref 19–32)
CREATININE: 0.5 mg/dL (ref 0.50–1.10)
Calcium: 8.9 mg/dL (ref 8.4–10.5)
Chloride: 105 mmol/L (ref 96–112)
GLUCOSE: 90 mg/dL (ref 70–99)
Potassium: 3.9 mmol/L (ref 3.5–5.1)
Sodium: 135 mmol/L (ref 135–145)

## 2014-12-12 LAB — PREPARE RBC (CROSSMATCH)

## 2014-12-12 LAB — GLUCOSE, CAPILLARY
Glucose-Capillary: 85 mg/dL (ref 70–99)
Glucose-Capillary: 86 mg/dL (ref 70–99)

## 2014-12-12 SURGERY — Surgical Case
Anesthesia: Spinal | Site: Abdomen

## 2014-12-12 MED ORDER — SODIUM CHLORIDE 0.9 % IV SOLN: INTRAVENOUS | Status: DC

## 2014-12-12 MED ORDER — MORPHINE SULFATE 0.5 MG/ML IJ SOLN
INTRAMUSCULAR | Status: AC
  Filled 2014-12-12: qty 10

## 2014-12-12 MED ORDER — DIPHENHYDRAMINE HCL 25 MG PO CAPS
25.0000 mg | ORAL_CAPSULE | Freq: Four times a day (QID) | ORAL | Status: DC | PRN
  Administered 2014-12-13: 25 mg via ORAL

## 2014-12-12 MED ORDER — CEFAZOLIN SODIUM 1-5 GM-% IV SOLN
1.0000 g | Freq: Three times a day (TID) | INTRAVENOUS | Status: AC
  Administered 2014-12-12: 1 g via INTRAVENOUS
  Filled 2014-12-12: qty 50

## 2014-12-12 MED ORDER — SCOPOLAMINE 1 MG/3DAYS TD PT72
1.0000 | MEDICATED_PATCH | Freq: Once | TRANSDERMAL | Status: DC
  Administered 2014-12-12: 1.5 mg via TRANSDERMAL

## 2014-12-12 MED ORDER — DEXTROSE 5 % IV SOLN: 2.0000 g | INTRAVENOUS | Status: DC

## 2014-12-12 MED ORDER — MORPHINE SULFATE (PF) 0.5 MG/ML IJ SOLN
INTRAMUSCULAR | Status: DC | PRN
  Administered 2014-12-12: 1 mg via EPIDURAL
  Administered 2014-12-12: 4 mg via EPIDURAL

## 2014-12-12 MED ORDER — KETOROLAC TROMETHAMINE 30 MG/ML IJ SOLN: 30.0000 mg | Freq: Once | INTRAMUSCULAR | Status: DC | PRN

## 2014-12-12 MED ORDER — DIPHENHYDRAMINE HCL 50 MG/ML IJ SOLN
INTRAMUSCULAR | Status: DC | PRN
  Administered 2014-12-12: 25 mg via INTRAVENOUS

## 2014-12-12 MED ORDER — OXYTOCIN 40 UNITS IN LACTATED RINGERS INFUSION - SIMPLE MED: 62.5000 mL/h | INTRAVENOUS | Status: AC

## 2014-12-12 MED ORDER — SCOPOLAMINE 1 MG/3DAYS TD PT72
MEDICATED_PATCH | TRANSDERMAL | Status: AC
  Administered 2014-12-12: 1.5 mg via TRANSDERMAL
  Filled 2014-12-12: qty 1

## 2014-12-12 MED ORDER — DIPHENHYDRAMINE HCL 50 MG/ML IJ SOLN: 12.5000 mg | INTRAMUSCULAR | Status: DC | PRN

## 2014-12-12 MED ORDER — SENNOSIDES-DOCUSATE SODIUM 8.6-50 MG PO TABS
2.0000 | ORAL_TABLET | ORAL | Status: DC
  Administered 2014-12-13 – 2014-12-14 (×3): 2 via ORAL
  Filled 2014-12-12 (×4): qty 2

## 2014-12-12 MED ORDER — NALOXONE HCL 1 MG/ML IJ SOLN: 1.0000 ug/kg/h | INTRAVENOUS | Status: DC | PRN

## 2014-12-12 MED ORDER — SODIUM CHLORIDE 0.9 % IJ SOLN: 3.0000 mL | INTRAMUSCULAR | Status: DC | PRN

## 2014-12-12 MED ORDER — CEFAZOLIN SODIUM-DEXTROSE 2-3 GM-% IV SOLR
2.0000 g | INTRAVENOUS | Status: DC
Start: 2014-12-12 — End: 2014-12-12

## 2014-12-12 MED ORDER — NALOXONE HCL 0.4 MG/ML IJ SOLN: 0.4000 mg | INTRAMUSCULAR | Status: DC | PRN

## 2014-12-12 MED ORDER — OXYTOCIN 10 UNIT/ML IJ SOLN
INTRAMUSCULAR | Status: AC
  Filled 2014-12-12: qty 4

## 2014-12-12 MED ORDER — IBUPROFEN 600 MG PO TABS: 600.0000 mg | ORAL_TABLET | Freq: Four times a day (QID) | ORAL | Status: DC | PRN

## 2014-12-12 MED ORDER — LACTATED RINGERS IV SOLN
INTRAVENOUS | Status: DC
  Administered 2014-12-12: 15:00:00 via INTRAVENOUS

## 2014-12-12 MED ORDER — BUPIVACAINE HCL (PF) 0.5 % IJ SOLN
INTRAMUSCULAR | Status: AC
  Filled 2014-12-12: qty 30

## 2014-12-12 MED ORDER — MEPERIDINE HCL 25 MG/ML IJ SOLN: 6.2500 mg | INTRAMUSCULAR | Status: DC | PRN

## 2014-12-12 MED ORDER — SIMETHICONE 80 MG PO CHEW: 80.0000 mg | CHEWABLE_TABLET | ORAL | Status: DC | PRN

## 2014-12-12 MED ORDER — SIMETHICONE 80 MG PO CHEW
80.0000 mg | CHEWABLE_TABLET | Freq: Three times a day (TID) | ORAL | Status: DC
  Administered 2014-12-13 – 2014-12-15 (×8): 80 mg via ORAL
  Filled 2014-12-12 (×8): qty 1

## 2014-12-12 MED ORDER — KETOROLAC TROMETHAMINE 30 MG/ML IJ SOLN: 30.0000 mg | Freq: Four times a day (QID) | INTRAMUSCULAR | Status: AC | PRN

## 2014-12-12 MED ORDER — FENTANYL CITRATE (PF) 100 MCG/2ML IJ SOLN
INTRAMUSCULAR | Status: DC | PRN
  Administered 2014-12-12: 100 ug via EPIDURAL

## 2014-12-12 MED ORDER — HYDROMORPHONE HCL 1 MG/ML IJ SOLN: 0.2500 mg | INTRAMUSCULAR | Status: DC | PRN

## 2014-12-12 MED ORDER — PHENYLEPHRINE 8 MG IN D5W 100 ML (0.08MG/ML) PREMIX OPTIME
INJECTION | INTRAVENOUS | Status: DC | PRN
  Administered 2014-12-12: 60 ug/min via INTRAVENOUS

## 2014-12-12 MED ORDER — PHENYLEPHRINE 8 MG IN D5W 100 ML (0.08MG/ML) PREMIX OPTIME
INJECTION | INTRAVENOUS | Status: AC
  Filled 2014-12-12: qty 100

## 2014-12-12 MED ORDER — TETANUS-DIPHTH-ACELL PERTUSSIS 5-2.5-18.5 LF-MCG/0.5 IM SUSP: 0.5000 mL | Freq: Once | INTRAMUSCULAR | Status: DC

## 2014-12-12 MED ORDER — OXYCODONE-ACETAMINOPHEN 5-325 MG PO TABS
1.0000 | ORAL_TABLET | ORAL | Status: DC | PRN
  Administered 2014-12-13: 1 via ORAL
  Filled 2014-12-12: qty 1

## 2014-12-12 MED ORDER — LACTATED RINGERS IV SOLN
INTRAVENOUS | Status: DC
  Administered 2014-12-12 (×2): via INTRAVENOUS

## 2014-12-12 MED ORDER — FENTANYL CITRATE (PF) 100 MCG/2ML IJ SOLN
INTRAMUSCULAR | Status: AC
  Filled 2014-12-12: qty 2

## 2014-12-12 MED ORDER — MENTHOL 3 MG MT LOZG: 1.0000 | LOZENGE | OROMUCOSAL | Status: DC | PRN

## 2014-12-12 MED ORDER — KETOROLAC TROMETHAMINE 30 MG/ML IJ SOLN
30.0000 mg | Freq: Four times a day (QID) | INTRAMUSCULAR | Status: AC | PRN
  Administered 2014-12-12: 30 mg via INTRAMUSCULAR

## 2014-12-12 MED ORDER — ONDANSETRON HCL 4 MG/2ML IJ SOLN
INTRAMUSCULAR | Status: AC
  Filled 2014-12-12: qty 2

## 2014-12-12 MED ORDER — NALBUPHINE HCL 10 MG/ML IJ SOLN
5.0000 mg | INTRAMUSCULAR | Status: DC | PRN
  Administered 2014-12-12 – 2014-12-13 (×2): 5 mg via INTRAVENOUS
  Filled 2014-12-12: qty 1

## 2014-12-12 MED ORDER — LANOLIN HYDROUS EX OINT: 1.0000 "application " | TOPICAL_OINTMENT | CUTANEOUS | Status: DC | PRN

## 2014-12-12 MED ORDER — NALBUPHINE HCL 10 MG/ML IJ SOLN
INTRAMUSCULAR | Status: AC
  Filled 2014-12-12: qty 1

## 2014-12-12 MED ORDER — OXYCODONE-ACETAMINOPHEN 5-325 MG PO TABS
2.0000 | ORAL_TABLET | ORAL | Status: DC | PRN
  Administered 2014-12-13 – 2014-12-15 (×10): 2 via ORAL
  Filled 2014-12-12 (×10): qty 2

## 2014-12-12 MED ORDER — KETOROLAC TROMETHAMINE 30 MG/ML IJ SOLN
INTRAMUSCULAR | Status: AC
  Filled 2014-12-12: qty 1

## 2014-12-12 MED ORDER — LIDOCAINE-EPINEPHRINE (PF) 2 %-1:200000 IJ SOLN
INTRAMUSCULAR | Status: DC | PRN
  Administered 2014-12-12: 2 mL via EPIDURAL
  Administered 2014-12-12: 5 mL via EPIDURAL
  Administered 2014-12-12: 2 mL via EPIDURAL
  Administered 2014-12-12: 1 mL via EPIDURAL

## 2014-12-12 MED ORDER — LACTATED RINGERS IV SOLN
INTRAVENOUS | Status: DC
  Administered 2014-12-13: 03:00:00 via INTRAVENOUS

## 2014-12-12 MED ORDER — BUPIVACAINE HCL (PF) 0.5 % IJ SOLN
INTRAMUSCULAR | Status: DC | PRN
  Administered 2014-12-12: 30 mL

## 2014-12-12 MED ORDER — PROMETHAZINE HCL 25 MG/ML IJ SOLN: 6.2500 mg | INTRAMUSCULAR | Status: DC | PRN

## 2014-12-12 MED ORDER — BUPIVACAINE IN DEXTROSE 0.75-8.25 % IT SOLN
INTRATHECAL | Status: DC | PRN
  Administered 2014-12-12: 1.5 mL via INTRATHECAL

## 2014-12-12 MED ORDER — NALBUPHINE HCL 10 MG/ML IJ SOLN: 5.0000 mg | INTRAMUSCULAR | Status: DC | PRN

## 2014-12-12 MED ORDER — ZOLPIDEM TARTRATE 5 MG PO TABS
5.0000 mg | ORAL_TABLET | Freq: Every evening | ORAL | Status: DC | PRN
  Administered 2014-12-13 – 2014-12-14 (×3): 5 mg via ORAL
  Filled 2014-12-12 (×3): qty 1

## 2014-12-12 MED ORDER — DEXTROSE-NACL 5-0.45 % IV SOLN: INTRAVENOUS | Status: DC

## 2014-12-12 MED ORDER — DIPHENHYDRAMINE HCL 25 MG PO CAPS
25.0000 mg | ORAL_CAPSULE | ORAL | Status: DC | PRN
  Administered 2014-12-14: 25 mg via ORAL
  Filled 2014-12-12 (×2): qty 1

## 2014-12-12 MED ORDER — ONDANSETRON HCL 4 MG/2ML IJ SOLN
INTRAMUSCULAR | Status: DC | PRN
Start: 2014-12-12 — End: 2014-12-12
  Administered 2014-12-12: 4 mg via INTRAVENOUS

## 2014-12-12 MED ORDER — LACTATED RINGERS IV SOLN
Freq: Once | INTRAVENOUS | Status: AC
  Administered 2014-12-12: 15:00:00 via INTRAVENOUS

## 2014-12-12 MED ORDER — IBUPROFEN 600 MG PO TABS
600.0000 mg | ORAL_TABLET | Freq: Four times a day (QID) | ORAL | Status: DC
  Administered 2014-12-13 (×4): 600 mg via ORAL
  Filled 2014-12-12 (×6): qty 1

## 2014-12-12 MED ORDER — DIBUCAINE 1 % RE OINT
1.0000 "application " | TOPICAL_OINTMENT | RECTAL | Status: DC | PRN
  Administered 2014-12-14: 1 via RECTAL
  Filled 2014-12-12: qty 28

## 2014-12-12 MED ORDER — CEFAZOLIN SODIUM-DEXTROSE 2-3 GM-% IV SOLR
INTRAVENOUS | Status: AC
  Administered 2014-12-12: 2 g via INTRAVENOUS
  Filled 2014-12-12: qty 50

## 2014-12-12 MED ORDER — ACETAMINOPHEN 325 MG PO TABS: 650.0000 mg | ORAL_TABLET | ORAL | Status: DC | PRN

## 2014-12-12 MED ORDER — KETOROLAC TROMETHAMINE 30 MG/ML IJ SOLN: 30.0000 mg | Freq: Once | INTRAMUSCULAR | Status: DC

## 2014-12-12 MED ORDER — NALBUPHINE HCL 10 MG/ML IJ SOLN: 5.0000 mg | Freq: Once | INTRAMUSCULAR | Status: AC | PRN

## 2014-12-12 MED ORDER — OXYTOCIN 10 UNIT/ML IJ SOLN
40.0000 [IU] | INTRAVENOUS | Status: DC | PRN
  Administered 2014-12-12: 40 [IU] via INTRAVENOUS

## 2014-12-12 MED ORDER — SIMETHICONE 80 MG PO CHEW
80.0000 mg | CHEWABLE_TABLET | ORAL | Status: DC
Start: 2014-12-13 — End: 2014-12-15
  Administered 2014-12-13 – 2014-12-14 (×3): 80 mg via ORAL
  Filled 2014-12-12 (×3): qty 1

## 2014-12-12 MED ORDER — PRENATAL MULTIVITAMIN CH
1.0000 | ORAL_TABLET | Freq: Every day | ORAL | Status: DC
  Administered 2014-12-13 – 2014-12-15 (×3): 1 via ORAL
  Filled 2014-12-12 (×3): qty 1

## 2014-12-12 MED ORDER — ONDANSETRON HCL 4 MG/2ML IJ SOLN: 4.0000 mg | Freq: Three times a day (TID) | INTRAMUSCULAR | Status: DC | PRN

## 2014-12-12 MED ORDER — WITCH HAZEL-GLYCERIN EX PADS
1.0000 "application " | MEDICATED_PAD | CUTANEOUS | Status: DC | PRN
  Administered 2014-12-14: 1 via TOPICAL

## 2014-12-12 SURGICAL SUPPLY — 42 items
BENZOIN TINCTURE PRP APPL 2/3 (GAUZE/BANDAGES/DRESSINGS) ×3 IMPLANT
BINDER ABD UNIV 10 28-50 (GAUZE/BANDAGES/DRESSINGS) ×1 IMPLANT
BINDER ABD UNIV 12 45-62 (WOUND CARE) IMPLANT
BINDER ABDOM UNIV 10 (GAUZE/BANDAGES/DRESSINGS) ×3
BINDER ABDOMINAL 46IN 62IN (WOUND CARE)
CLAMP CORD UMBIL (MISCELLANEOUS) IMPLANT
CLIP FILSHIE TUBAL LIGA STRL (Clip) ×3 IMPLANT
CLOSURE WOUND 1/2 X4 (GAUZE/BANDAGES/DRESSINGS) ×1
CLOTH BEACON ORANGE TIMEOUT ST (SAFETY) ×3 IMPLANT
DRAPE SHEET LG 3/4 BI-LAMINATE (DRAPES) IMPLANT
DRSG OPSITE POSTOP 4X10 (GAUZE/BANDAGES/DRESSINGS) ×3 IMPLANT
DURAPREP 26ML APPLICATOR (WOUND CARE) ×3 IMPLANT
ELECT REM PT RETURN 9FT ADLT (ELECTROSURGICAL) ×3
ELECTRODE REM PT RTRN 9FT ADLT (ELECTROSURGICAL) ×1 IMPLANT
EXTRACTOR VACUUM KIWI (MISCELLANEOUS) IMPLANT
GLOVE BIO SURGEON STRL SZ7 (GLOVE) ×3 IMPLANT
GLOVE BIOGEL PI IND STRL 7.0 (GLOVE) ×1 IMPLANT
GLOVE BIOGEL PI INDICATOR 7.0 (GLOVE) ×2
GOWN STRL REUS W/TWL LRG LVL3 (GOWN DISPOSABLE) ×6 IMPLANT
KIT ABG SYR 3ML LUER SLIP (SYRINGE) IMPLANT
NEEDLE HYPO 22GX1.5 SAFETY (NEEDLE) ×3 IMPLANT
NEEDLE HYPO 25X5/8 SAFETYGLIDE (NEEDLE) IMPLANT
NS IRRIG 1000ML POUR BTL (IV SOLUTION) ×3 IMPLANT
PACK C SECTION WH (CUSTOM PROCEDURE TRAY) ×3 IMPLANT
PAD ABD 7.5X8 STRL (GAUZE/BANDAGES/DRESSINGS) ×3 IMPLANT
PAD ABD 8X7 1/2 STERILE (GAUZE/BANDAGES/DRESSINGS) ×3 IMPLANT
PAD OB MATERNITY 4.3X12.25 (PERSONAL CARE ITEMS) ×3 IMPLANT
RTRCTR C-SECT PINK 25CM LRG (MISCELLANEOUS) IMPLANT
SPONGE GAUZE 4X4 12PLY STER LF (GAUZE/BANDAGES/DRESSINGS) ×3 IMPLANT
SPONGE SURGIFOAM ABS GEL 12-7 (HEMOSTASIS) ×3 IMPLANT
STAPLER VISISTAT 35W (STAPLE) IMPLANT
STRIP CLOSURE SKIN 1/2X4 (GAUZE/BANDAGES/DRESSINGS) ×2 IMPLANT
SUT PDS AB 0 CTX 60 (SUTURE) IMPLANT
SUT PLAIN 0 NONE (SUTURE) IMPLANT
SUT SILK 0 TIES 10X30 (SUTURE) IMPLANT
SUT VIC AB 0 CT1 36 (SUTURE) ×9 IMPLANT
SUT VIC AB 3-0 CT1 27 (SUTURE) ×6
SUT VIC AB 3-0 CT1 TAPERPNT 27 (SUTURE) ×3 IMPLANT
SUT VIC AB 4-0 KS 27 (SUTURE) ×3 IMPLANT
SYR CONTROL 10ML LL (SYRINGE) ×3 IMPLANT
TOWEL OR 17X24 6PK STRL BLUE (TOWEL DISPOSABLE) ×3 IMPLANT
TRAY FOLEY CATH SILVER 14FR (SET/KITS/TRAYS/PACK) ×3 IMPLANT

## 2014-12-12 NOTE — OR Nursing (Signed)
Patient with blocked urinary catheter bag. Bag changed per sterile technique and resealed.  By Eden EmmsLou Valor [place], RN and Octaviano GlowStacey Shavers, RN

## 2014-12-12 NOTE — H&P (Signed)
LABOR ADMISSION HISTORY AND PHYSICAL  Azucena FreedKeoshea Q Maranan is a 34 y.o. female [phone] with IUP at 5825w6d by 4182w1d redating sono presenting for repeat cesarean section x 7. She reports +FMs, No LOF, no VB, no blurry vision, headaches or peripheral edema, and RUQ pain. She plans on breast feeding while in hosp. She requests Mirena for birth control. Noted to have large window at 6th cesarean section performed 37-38w. Current isoimmunization.  Dating: By 3682w1d dating sono ---> Estimated Date of Delivery: 01/02/15   Prenatal History/Complications:  Past Medical History: Past Medical History  Diagnosis Date  . Gonorrhea   . Trichimoniasis   . Hx of chlamydia infection   . H/O bacterial infection   . H/O varicella   . Headache(784.0)     Frequent  . Acid reflux   . Postpartum depression   . Depression     NO MEDS  . Abnormal Pap smear     2003 ASC-US 2005 ASC-US LSIL high risk HPV;LAST PAP A WHILE AGO  . 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 H/O migraine   . Gestational diabetes 2008    NO MEDS;DIET CONTROLLED  . Gestational diabetes mellitus 2012    NO MEDS; DIET CONTROLLED  . 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 Status post cesarean delivery 04/23/2012    Past Surgical History: Past Surgical History  Procedure Laterality Date  . Cesarean section  2005    x4;FIRST CHILD IN FETAL DISTRESS  . Cesarean section  04/23/2012    Procedure: CESAREAN SECTION; Surgeon: Purcell NailsAngela Y Roberts, MD; Location: [facility]; Service: Obstetrics; Laterality: N/A;  . Cesarean section N/A 05/03/2013    Procedure: REPEAT CESAREAN SECTION WITH POSS ABDOMINAL HYSTERECTOMY; Surgeon: Purcell NailsAngela Y Roberts, MD; Location: [facility]; Service: Obstetrics; Laterality: N/A;    Obstetrical History: OB History    Gravida Para Term Preterm AB TAB SAB Ectopic Multiple Living   8 6 6  0 1  0 1 0 0 6      Social History: History   Social History  . Marital Status: Single    Spouse Name: N/A  . Number of Children: 4  . Years of Education: 8   Occupational History  . RETAIL    Social History Main Topics  . Smoking status: Current Every Day Smoker -- 0.25 packs/day    Types: Cigarettes  . Smokeless tobacco: Never Used  . Alcohol Use: Yes     Comment: occasional  . Drug Use: No  . Sexual Activity: No   Other Topics Concern  . Not on file   Social History Narrative    Family History: Family History  Problem Relation Age of Onset  . Hypertension Paternal Grandfather   . Anesthesia problems Neg Hx   . Hypotension Neg Hx   . Malignant hyperthermia Neg Hx   . Pseudochol deficiency Neg Hx   . COPD Mother     Bronchitis  . Asthma Brother   . COPD Brother     Bronchitis  . Hypertension Paternal Grandmother   . Other Paternal Grandmother     SPIDER VEINS  . Other Paternal Aunt     SPIDER VEINS  . Seizures Cousin     PATERNAL    Allergies: No Known Allergies  No prescriptions prior to admission     Review of Systems   All systems reviewed and negative except as stated in HPI BP 119/77 mmHg  Pulse 95  Temp(Src) 98.2 F (36.8 C) (Oral)  Resp 18  SpO2 99%  LMP 02/28/2014 Pt in NAD Last menstrual period 02/28/2014, not currently breastfeeding. General appearance: alert and cooperative Lungs: clear to auscultation bilaterally Heart: regular rate and rhythm Abdomen: soft, non-tender; bowel sounds normal Extremities: Homans sign is negative, no sign of DVT Skin: multiple tattoos        Prenatal labs: ABO, Rh: A/NEG/-- (10/26 1538) Antibody: POS (01/25 1339) Rubella:   RPR: NON REAC (02/22 1401)  HBsAg: NEGATIVE (02/22 1401)  HIV: NONREACTIVE (02/22 1401)  GBS:    1 hr Glucola 194 Genetic  screening Neg quad Anatomy US rim of pericardial effusion noted. No other signs of hydrops.   Prenatal Transfer Tool  Maternal Diabetes: Yes: Diabetes Type: Diet controlled Genetic Screening: Normal Maternal Ultrasounds/Referrals: Normal Fetal Ultrasounds or other Referrals: Referred to Materal Fetal Medicine  Maternal Substance Abuse: History   Social History  . Marital Status: Single    Spouse Name: N/A  . Number of Children: 4  . Years of Education: 8   Occupational History  . RETAIL    Social History Main Topics  . Smoking status: Current Every Day Smoker -- 0.25 packs/day    Types: Cigarettes  . Smokeless tobacco: Never Used  . Alcohol Use: Yes     Comment: occasional  . Drug Use: No  . Sexual Activity: No   Other Topics Concern  . Not on file   Social History Narrative    Significant Maternal Medications: None Significant Maternal Lab Results: Lab values include: Other:  anti-D antibodies  Large window noted on op report at time of 6th cesarean section done between 37-38 weeks.  [facility] Prenatal Labs  Dating 14 wk ultrasound Blood type:   Genetic Screen Quad negative Antibody:   Anatomic [place] Normal Rubella:   GTT Early: 194 > need diabetic education Third trimester:  RPR: NON REAC (08/09 1717)   TDaP vaccine 10/16/14 HBsAg:   Flu vaccine Declined HIV: NONREACTIVE (08/09 1717)   GBS negative GBS:   Contraception  Pap:Neg; GC/CT neg x 2  Baby Food Breast   Circumcision    Pediatrician    Support Person       No results found for this or any previous visit (from the past 24 hour(s)).  Patient Active Problem List   Diagnosis Date Noted  . [redacted] weeks gestation of pregnancy   . [redacted] weeks gestation of pregnancy   . Diet controlled gestational diabetes mellitus in third trimester   . Drug dependence in pregnancy   . Isoimmunization from blood group  incompatibility during pregnancy   . [redacted] weeks gestation of pregnancy   . [redacted] weeks gestation of pregnancy   . Uterine scar from previous cesarean delivery, antepartum   . Cigarette smoker   . Fetal arrhythmia affecting pregnancy, antepartum   . GDM (gestational diabetes mellitus)   . Rh alloimmunization, maternal, antepartum   . [redacted] weeks gestation of pregnancy   . 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 Tobacco use during pregnancy   . Abnormal quad screen 06/28/2014  . Gestational diabetes mellitus, antepartum 06/20/2014  . Incarcerated status of pregnant patient 06/19/2014  . Previous cesarean delivery X 6 affecting pregnancy, antepartum 06/19/2014  . Cannabis abuse 03/21/2011    Assessment: BERENICE OEHLERT is a 34 y.o. [id number] at [redacted]w[redacted]d here for repeat cesarean section 2/2 previous kerr x 6 with large window noted at 6th cesarean section, isoimmunization, drug dependence, A1DM, currently incarcerated  #MOF: breast and bottle #  [id number] Mirena #Circ: unknown  The risks of cesarean section discussed with the patient included but were not limited to: bleeding which may require transfusion or reoperation; infection which may require antibiotics; injury to bowel, bladder, ureters or other surrounding organs; injury to the fetus; need for additional procedures including hysterectomy in the event of a life-threatening hemorrhage; placental abnormalities wth subsequent pregnancies, incisional problems, thromboembolic phenomenon and other postoperative/anesthesia complications. The patient concurred with the proposed plan, giving informed written consent for the procedure. Patient has been NPO since 0500 she will remain NPO for procedure. Anesthesia and OR aware. Preoperative prophylactic antibiotics and SCDs ordered on call to the OR. To OR when ready.

## 2014-12-12 NOTE — Anesthesia Preprocedure Evaluation (Signed)
Anesthesia Evaluation  Patient identified by MRN, date of birth, ID band Patient awake    Reviewed: Allergy & Precautions, H&P , NPO status , Patient's Chart, lab work & pertinent test results  Airway Mallampati: I  TM Distance: >3 FB Neck ROM: full    Dental  (+) Poor Dentition   Pulmonary Current Smoker,  breath sounds clear to auscultation  Pulmonary exam normal       Cardiovascular negative cardio ROS      Neuro/Psych    GI/Hepatic Neg liver ROS,   Endo/Other  diabetes  Renal/GU negative Renal ROS     Musculoskeletal   Abdominal Normal abdominal exam  (+)   Peds  Hematology negative hematology ROS (+)   Anesthesia Other Findings   Reproductive/Obstetrics (+) Pregnancy                             Anesthesia Physical Anesthesia Plan  ASA: II  Anesthesia Plan: Combined Spinal and Epidural   Post-op Pain Management:    Induction:   Airway Management Planned:   Additional Equipment:   Intra-op Plan:   Post-operative Plan:   Informed Consent: I have reviewed the patients History and Physical, chart, labs and discussed the procedure including the risks, benefits and alternatives for the proposed anesthesia with the patient or authorized representative who has indicated his/her understanding and acceptance.     Plan Discussed with: CRNA and Surgeon  Anesthesia Plan Comments:         Anesthesia Quick Evaluation

## 2014-12-12 NOTE — Addendum Note (Signed)
Addendum  created 12/12/14 1845 by Leilani AbleFranklin Solenne Manwarren, MD   Modules edited: Anesthesia Attestations

## 2014-12-12 NOTE — Op Note (Signed)
Tina [name] PROCEDURE DATE: 12/12/2014  PREOPERATIVE DIAGNOSES: Intrauterine pregnancy at 1554w0d weeks gestation; previous cesarean section x 6, satisfied parity, previous uterine window  POSTOPERATIVE DIAGNOSES: Intrauterine pregnancy at 2554w0d weeks gestation; previous cesarean section x 6, satisfied parity, previous uterine window; cystotomy  3cm bladder laceration  PROCEDURE: Repeat Low Transverse Cesarean Section x 7 with bilateral tubal ligation using Filshie clips and repair of cystotomy  SURGEON:  Dr. Erin FullingHarraway Smith  ASSISTANT:  Dr. Fredirick LatheKristy Acosta  ANESTHESIOLOGIST: Dr. Arby BarretteHatchett  ANESTHESIA: combined Spinal Epidural  INTRAVENOUS FLUIDS: 2300 ml  ESTIMATED BLOOD LOSS: 700 ml  URINE OUTPUT:  150 ml  SPECIMENS: Placenta sent to L&D  COMPLICATIONS: inherent cystotomy- repaired    INDICATIONS: Tina [name] is a [age] y.o. [id number] at 2854w0d who presents for repeat cesarean section at 37 weeks due to history of 6 prior cesarean sections.  The last surgery was notable for a large uterine window at the time of delivery.  Due to this MFM recommended delivery at 36-[redacted] weeks gestation to prevent uterine dehiscence.  The risks of cesarean section were discussed with the patient including but were not limited to: bleeding which may require transfusion or reoperation; infection which may require antibiotics; injury to bowel, bladder, ureters or other surrounding organs; injury to the fetus; need for additional procedures including hysterectomy in the event of a life-threatening hemorrhage; placental abnormalities wth subsequent pregnancies, incisional problems, thromboembolic phenomenon and other postoperative/anesthesia complications.   I counseled the patient extensively that her risks were greater given the number of cesareans that she had in the past.  I explained to her that her risk of bladder and bowel injury specifically was greater and that her skin incision would be higher than  her previous incisions. The patient expressed understanding.  She was counseled regarding a sterilization.  She reports that if scar tissue was present that she wanted to proceed with a bilateral tubal ligation.  The failure risk of 3-11/998 and increased risk of ectopic pregnancy should a pregnancy occur were reviewed with the patient.  She expressed understanding and all of her questions were answered.   Informed written consent was obtained.  FINDINGS:  Viable female infant in cephalic presentation.  Apgars 9 and 9.  Clear amniotic fluid.  Intact placenta, three vessel cord. Fallopian tubes and ovaries normal bilaterally.   Scarring of the lower uterine segment.  There were adhesions of the bladder to the anterior uterine wall beyond the lower uterine segment.  The lower uterine segment was not thinned out.  The fascia was completely scarred requiring a full thickness closure.      PROCEDURE IN DETAIL:  The patient preoperatively received intravenous antibiotics and had sequential compression devices applied to her lower extremities.  She was then taken to the operating room where combined spinal/epidural anesthesia was administered. She was then placed in a dorsal supine position with a leftward tilt, and prepped and draped in a sterile manner.  A foley catheter was placed into her bladder and attached to constant gravity.  After an adequate timeout was performed, a transverse skin incision was made with scalpel approximately 3 cm above the prior incisions and carried through to the underlying layer of fascia. The fascia was incised in the midline, and this incision was extended bilaterally using the Mayo scissors and blunt dissection.  Kocher clamps were applied to the superior aspect of the fascial incision and the underlying rectus muscles were dissected off bluntly. A similar process was carried out on the  inferior aspect of the fascial incision. Upon opening the fascia and scar tissue the uterus was  visualized as the rectus appeared to be contiguous with the fascia.  The peritoneum was entered digitally. The bladder was noted to be adherent to the lower uterine segment as above. The bladder was decompressed however the dome of the bladder was adherent to the uterus and an inherent cystotomy was performed upon entering the uterus. A transverse hysterotomy was made with a scalpel and extended bilaterally bluntly.  The infant was successfully delivered, the cord was clamped and cut and the infant was handed over to the awaiting neonatology team. Uterine massage was then administered, and the placenta delivered intact with a three-vessel cord. The uterus was  Exteriorized and cleared of clot and debris.  The hysterotomy was closed with 0 Vicryl in a running locked fashion with interrupted sutures placed to obtain hemostasis  Left fallopian tube was identified and followed out to the fimbriated end.  A Filshie clip was placed on the left fallopian tube about 3 cm from the cornual attachment, with care given to incorporate the underlying mesosalpinx.  A similar process was carried out on the right side allowing for bilateral tubal sterilization.  Good hemostasis was noted overall.  Uterus replaced into pelvis. At this point the cystotomy was fully evaluated and repaired in two layers with 3-0 vicryl .  The foley was then clamped and the bladder was backfilled with sterile water and sterile milk to confirm its integrity after the repair.  There was no leakage noted to >500cc of fluid.  The foley was then unclamped and the bladder was emptied through gravity drainage. The pelvis was cleared of all clot and debris.  Excellent hemostasis was noted. The peritoneum and the muscles were reapproximated using 0 Vicryl interrupted stitches. The fascia was then closed using 0 looped PDS in a running fashion.  The subcutaneous layer was irrigated, and 30 ml of 0.5% Marcaine was injected subcutaneously around the incision.  The  skin was closed with a 4-0 Vicryl subcuticular stitch. The patient tolerated the procedure well. Sponge, lap, instrument and needle counts were correct x 2.  She was taken to the recovery room in stable condition.   The patient was notified intraoperatively about the cystotomy and repair and the need for an indwelling Foley catheter for 10 days post op.  Tina [name], M.D., [facility]

## 2014-12-12 NOTE — Progress Notes (Signed)
Patient given Nubain 5 mg for itching.

## 2014-12-12 NOTE — Anesthesia Procedure Notes (Signed)
Spinal Patient location during procedure: OR Start time: 12/12/2014 2:58 PM End time: 12/12/2014 3:01 PM Staffing Anesthesiologist: Leilani AbleHATCHETT, Ladana Chavero Performed by: anesthesiologist  Preanesthetic Checklist Completed: patient identified, surgical consent, pre-op evaluation, timeout performed, IV checked, risks and benefits discussed and monitors and equipment checked Spinal Block Patient position: sitting Prep: site prepped and draped and DuraPrep Patient monitoring: cardiac monitor, continuous pulse ox, blood pressure and heart rate Approach: midline Location: L3-4 Injection technique: catheter Needle Needle type: Tuohy and Sprotte  Needle gauge: 24 G Needle length: 15 cm Needle insertion depth: 6 cm Catheter type: closed end flexible Catheter size: 19 g Catheter at skin depth: 11 cm Assessment Sensory level: T6

## 2014-12-12 NOTE — Anesthesia Postprocedure Evaluation (Signed)
Anesthesia Post Note  Patient: Tina [name]  Procedure(s) Performed: Procedure(s) (LRB): CESAREAN SECTION REPEAT (N/A)  Anesthesia type: Spinal  Patient location: PACU  Post pain: Pain level controlled  Post assessment: Post-op Vital signs reviewed  Last Vitals:  Filed Vitals:   12/12/14 1831  BP: 116/80  Pulse: 83  Temp: 37.1 C  Resp: 18    Post vital signs: Reviewed  Level of consciousness: awake  Complications: No apparent anesthesia complications

## 2014-12-12 NOTE — Progress Notes (Signed)
While in PACU patient reported that she does request BTL in event of complications such as scarring.  Discussed risks of addition of BTL including ectopic pregnancy.  Agreeable as above.  Consent addended to reflect possible BTL add on.  Perry MountACOSTA,Zanaiya Calabria ROCIO, MD 2:53 PM

## 2014-12-12 NOTE — Transfer of Care (Signed)
Immediate Anesthesia Transfer of Care Note  Patient: Tina [name]  Procedure(s) Performed: Procedure(s) with comments: CESAREAN SECTION REPEAT (N/A) - Tracie S confirmed FA  Patient Location: PACU  Anesthesia Type:Spinal  Level of Consciousness: awake, alert  and oriented  Airway & Oxygen Therapy: Patient Spontanous Breathing  Post-op Assessment: Report given to RN and Post -op Vital signs reviewed and stable  Post vital signs: Reviewed and stable  Last Vitals:  Filed Vitals:   12/12/14 1223  BP: 119/77  Pulse: 95  Temp: 36.8 C  Resp: 18    Complications: No apparent anesthesia complications

## 2014-12-12 NOTE — Consult Note (Signed)
Neonatology Note:   Attendance at C-section:    I was asked by Dr. Erin FullingHarraway-Smith to attend this repeat C/S at 37 weeks due to known thin uterine wall and previous C/S. The mother is a [id number] A neg, GBS neg with Rh sensitization and diet-controlled GDM. Mother is currently incarcerated. ROM at delivery, fluid clear. Infant vigorous with good spontaneous cry and tone. Needed no suctioning. Ap 9/9. Lungs clear to ausc in DR. To CN to care of Pediatrician.   Doretha Souhristie C. Hallis Meditz, MD

## 2014-12-13 DIAGNOSIS — O34219 Maternal care for unspecified type scar from previous cesarean delivery: Secondary | ICD-10-CM | POA: Diagnosis present

## 2014-12-13 LAB — CBC
HEMATOCRIT: 26.9 % — AB (ref 36.0–46.0)
Hemoglobin: 8.9 g/dL — ABNORMAL LOW (ref 12.0–15.0)
MCH: 31 pg (ref 26.0–34.0)
MCHC: 33.1 g/dL (ref 30.0–36.0)
MCV: 93.7 fL (ref 78.0–100.0)
Platelets: 93 10*3/uL — ABNORMAL LOW (ref 150–400)
RBC: 2.87 MIL/uL — ABNORMAL LOW (ref 3.87–5.11)
RDW: 14.1 % (ref 11.5–15.5)
WBC: 5.5 10*3/uL (ref 4.0–10.5)

## 2014-12-13 LAB — RPR: RPR: NONREACTIVE

## 2014-12-13 NOTE — Progress Notes (Signed)
Clinical Social Work Department PSYCHOSOCIAL ASSESSMENT - MATERNAL/CHILD 12/13/2014  Patient:  Tina [name]  Account Number:  [id number]  Admit Date:  12/12/2014  Childs Name:   Tina [name]   Clinical Social Worker:  Tina [name], CLINICAL SOCIAL WORKER   Date/Time:  12/13/2014 10:00 AM  Date Referred:  12/12/2014   Referral source  Central Nursery     Referred reason  Other - See comment  Depression/Anxiety   Other referral source:   MOB is currently in jail.    I:  FAMILY / HOME ENVIRONMENT Child's legal guardian:  PARENT  Guardian - Name Guardian - Age Guardian - Address  Tina [name] 34 [place] [facility]  Did not identify/disclose information     Other household support members/support persons Other support:   MOB stated that she has 6 other children (ages 10, 8, 7, 3, 2, and 1).  She shared that her mother is included in her support system.    II  PSYCHOSOCIAL DATA Information Source:  Patient Interview  Financial and Community Resources Employment:   N/A   Financial resources:   If Medicaid - County:    School / Grade:  N/A Maternity Care Coordinator / Child Services Coordination / Early Interventions:   None reported  Cultural issues impacting care:   None reported    III  STRENGTHS Strengths  Compliance with medical plan   Strength comment:    IV  RISK FACTORS AND CURRENT PROBLEMS Current Problem:  YES   Risk Factor & Current Problem Patient Issue Family Issue Risk Factor / Current Problem Comment  Legal Involvement Y N MOB is currently incarcerated.  DSS Involvement Y N MOB has open CPS investigation.  Per CPS, her children are in custody of CPS.  Mental Illness Y N MOB presents with history of depression.    V  SOCIAL WORK ASSESSMENT CSW received consult due to MOB currently being incarcerated.  MOB presented as easily engaged and receptive to the visit. She displayed a full range in affect and was in a pleasant mood.  She was  noted to be interacting and bonding with the infant.  CSW spent approximately an hour with the MOB this morning in order to provide support and to assist the MOB to explore/process how she feels as she transitions to the postpartum period and prepares for the separation of herself and her infant when she returns to jail.  She stated that she has 6 other children who she has been separated from for 7 months (since incarceration).  MOB reflected upon the sadness that she has felt due to her separation from them.  She denied having an ability to receive/have emotional support at jail since she does not feel like she can talk to the inmates since they will "use it against me".  CSW continued to explore and assist the MOB identify her personal strengths that have assisted her to cope with these feelings.  She stated that she is motivated by her children and discussed how she has utilized her faith.  MOB discussed at length the hope that she feels that she will be able to be released from jail soon.  She stated that she has jail in one week, and that she hopes that her bond will be reduced and her family will be able to pay the bond.  MOB was not forthcoming with the events that led to her incarceration. She continued to discuss her long-term goals of reuniting with her children, and was receptive   to discussing/exploring how she will be able to reach her goals.   CSW inquired about MOB's thoughts about her ideal discharge plan for this infant. She stated that she hopes that her infant will be able to be placed with her mother.  CSW discussed that CPS will be notified in order to help with the safe discharge plan.  MOB was noted to become defensive when CSW explained need to make a report.  She denied that CPS had been involved prior to incarceration.   CSW consulted with CPS and spoke the assigned worker, R. Fuller.  She stated that the MOB's other children are either in foster care or with various family members.   This information contrasts the information that MOB provided to CSW as she stated that all of her children are with her grandmother.  CPS stated that they will arrive today in order to assist with the creation of a safe discharge plan for the infant.    VI SOCIAL WORK PLAN Social Work Plan  Patient/Family Education  Child Protective Services Report  Psychosocial Support/Ongoing Assessment of Needs   Type of pt/family education:   Postpartum depression   If child protective services report - county:  [place] If child protective services report - date:  12/13/2014 Information/referral to community resources comment:   Other social work plan:   CPS to meet with MOB today.  CSW will continue to closely follow up with CPS for infant's disposition planning.    CSW will continue to provide emotional support to MOB.     

## 2014-12-13 NOTE — Progress Notes (Signed)
Chaplain paged at the request of the pt to assist with the spiritual pain involved in being in the custody of Sheriff's department and being jailed. She will be allow to be with her newborn until she is discharged, at which time her child will be placed in the [facility].   Pt is very tearful over the impending separation from her newborn, She is praying earnestly for God to intervene and change the course of events to allow her to be the mother of her child. As her prayers do not seem to be answered as she desires, her spiritual and emotional state is decaying. Pt reports she has attended several churches in the past but does not have a supportive community of faith at this time. Given her status as a XXX patient and being in police custody, which does not allow outside visitors, she will likely begin to become more depressed as the time nears to separate from the child. It is important that in addition to the exceptional care given by medical staff that chaplains be paged to assist in the coming painful transition. Pt is questioning her decision to allow uncontested the [facility]. Again she is looking for divine intervention.  Recommend paging a chaplain at any time the pt needs or requests spiritual care. Further recommend daytime chaplains visit this pt in the morning of 21 April to continue to provide care and comfort.  Benjie Karvonenharles D. Jenia Klepper, DMin, MDiv Chaplain

## 2014-12-13 NOTE — Progress Notes (Signed)
UR chart review completed.  

## 2014-12-13 NOTE — Addendum Note (Signed)
Addendum  created 12/13/14 [id number] by Jhonnie GarnerBeth M Tasheena Wambolt, CRNA   Modules edited: Notes Section   Notes Section:  File: [id number]

## 2014-12-13 NOTE — Progress Notes (Addendum)
CSW met with CPS worker after CPS assessment was completed.  CPS stated that they will be filing a petition for custody of the infant.  At discharge, infant will be placed in a foster home.  CPS to fax a copy of the non-secure petition to CSW once completed.  CPS stated that infant is allowed to stay with the MOB until MOB is discharged from the hospital since MOB is being supervised by prison guards.  She stated that MOB was agreeable to the discharge plan and signed the safety plan.  CSW has received a copy of the safety plan and placed it in the infant's chart.    CSW attempted to follow up with the MOB to provide support.  MOB was observed to be tearful, holding the infant, and reading the Bible.  MOB was respectful, but stated that she did not feel like talking at this time.  CSW offered support from spiritual care, and she indicated interest, but continued to deny desire to talk to anyone at this time.  CSW encouraged MOB to notify staff of how to best support her during this time.  MOB agreed to notify staff of her needs as they arise.   CSW will continue to closely follow.   3:45: CSW received phone call from RN requesting CSW follow up with MOB, per MOB request.  MOB continued to present as appropriately tearful as she discussed her thoughts and feelings after her meeting with CPS.  She voiced frustration with interaction with CPS stating that she did not feel that CPS was listening to her and was accusing her of not being a good mother.  She continues to prepare and anticipate a difficult separation from the infant once she is discharged, but acknowledges that she will be allowed to remain with the infant until discharge.  Despite the numerous emotions she has felt today, she stated that she has been attempting to focus on the present moment and enjoy the bonding she can have with this infant.   CSW offered chaplain services.  MOB expressed interest and shared that she would like to speak to a  Chaplain later this afternoon.  CSW paged chaplain and requested visit. Chaplain to follow up.   MOB continues to express appreciation for CSW support. She stated that she would appreciate one more visit in the morning on 4/21.

## 2014-12-13 NOTE — Progress Notes (Signed)
Post-Op Day #1 Subjective:  Tina [name] is a 34 y.o. [id number] 7946w0d s/p rLTCS with BTL complicated by bladder laceration.  No acute events overnight.  Pt denies problems with ambulating, voiding or po intake.  She denies nausea or vomiting.  Pain is well controlled.  She has had flatus. She has not had bowel movement.  Lochia Minimal.  Plan for birth control is bilateral tubal ligation.  Method of Feeding: Bottle   Objective: Blood pressure 119/58, pulse 115, temperature 98.9 F (37.2 C), temperature source Oral, resp. rate 18, last menstrual period 02/28/2014, SpO2 99 %, unknown if currently breastfeeding.  Physical Exam:  General: alert, cooperative and no distress Lochia:normal flow Chest: CTAB Heart: RRR no m/r/g Abdomen: +BS, soft, nontender  Incision: dressing was intact and slightly saturated  Uterine Fundus: firm  DVT Evaluation: No evidence of DVT seen on physical exam. Extremities: no edema   Recent Labs  12/12/14 1215 12/13/14 0600  HGB 12.6 8.9*  HCT 36.6 26.9*   Urine output: 800 (15730mL/hr)   Assessment/Plan:  ASSESSMENT: Tina [name] is a 34 y.o. [id number] 346w0d s/p rLTCS with BTL complicated by bladder laceration.   Plan for discharge tomorrow, Social Work consult and Contraception BTL , continue monitoring urine output    LOS: 1 day   Virgil Lightner Santos-Sanchez 12/13/2014, 9:24 AM

## 2014-12-13 NOTE — Plan of Care (Signed)
Problem: Phase I Progression Outcomes Goal: Voiding adequately Outcome: Not Applicable Date Met:  31/28/11 Pt to keep foley cath in for 10 days

## 2014-12-13 NOTE — Anesthesia Postprocedure Evaluation (Signed)
Anesthesia Post Note  Patient: Tina [name]  Procedure(s) Performed: Procedure(s) (LRB): CESAREAN SECTION REPEAT (N/A)  Anesthesia type: Epidural  Patient location: Mother/Baby  Post pain: Pain level controlled  Post assessment: Post-op Vital signs reviewed  Last Vitals:  Filed Vitals:   12/13/14 0800  BP: 119/58  Pulse: 115  Temp: 37.2 C  Resp: 18    Post vital signs: Reviewed  Level of consciousness: awake  Complications: No apparent anesthesia complications

## 2014-12-14 ENCOUNTER — Encounter (HOSPITAL_COMMUNITY): Payer: Self-pay

## 2014-12-14 MED ORDER — MAGNESIUM HYDROXIDE 400 MG/5ML PO SUSP
30.0000 mL | Freq: Every day | ORAL | Status: DC | PRN
  Administered 2014-12-14: 30 mL via ORAL
  Filled 2014-12-14: qty 30

## 2014-12-14 NOTE — Progress Notes (Signed)
Patient spontaneously voided 500 cc lightly blood-tinged urine after 14 Fr urinary catheter removed. Patient had complained of urge of void and leaking around the catheter. Urine was free draining prior to discontinuing the 14 Fr. Urinary catheter. Plan to insert 16 Fr. Urinary catheter.
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
## 2014-12-14 NOTE — Progress Notes (Signed)
Post-op Day #2 Subjective:  Tina [name] is a 34 y.o. [id number] 4175w0d s/p #7 rLTCS with bladder laceration.  No acute events overnight.  Pt denies problems with ambulating, voiding or po intake.  She denies nausea or vomiting.  Pain is well controlled.  She has had flatus. She has not had bowel movement.  Lochia Minimal.  Plan for birth control is bilateral tubal ligation.  Method of Feeding: breast and bottle    Objective: Blood pressure 107/57, pulse 98, temperature 98.3 F (36.8 C), temperature source Oral, resp. rate 18, height 5\' 4"  (1.626 m), weight 83.008 kg (183 lb), last menstrual period 02/28/2014, SpO2 99 %, unknown if currently breastfeeding.  Physical Exam:  General: alert, cooperative and no distress Lochia:normal flow Chest: CTAB Heart: RRR no m/r/g Abdomen: +BS, soft, nontender Incision: dressing dry and intact  Uterine Fundus: firm DVT Evaluation: No evidence of DVT seen on physical exam. Extremities: no edema   Recent Labs  12/12/14 1215 12/13/14 0600  HGB 12.6 8.9*  HCT 36.6 26.9*   Urine output: 1.584mL/kg/hr Blood-tinged urine in bag, clear urine in tube   Assessment/Plan:  ASSESSMENT: Tina [name] is a 34 y.o. [id number] 6675w0d s/p #7 rLTCS with bladder laceration.   Plan for discharge tomorrow, Breastfeeding and Contraception BTL   LOS: 2 days   Idalys Santos-Sanchez 12/14/2014, 7:48 AM   CNM attestation Post Partum Day #2 from RLTCS/BTL I have seen and examined this patient.    Tina [name] is a 34 y.o. [id number] s/p RLTCS.  Pt denies problems with ambulating, voiding or po intake. Pain is well controlled.  Plan for birth control is bilateral tubal ligation.  Method of Feeding: breast  PE:  BP 107/57 mmHg  Pulse 98  Temp(Src) 98.3 F (36.8 C) (Oral)  Resp 18  Ht 5\' 4"  (1.626 m)  Wt 83.008 kg (183 lb)  BMI 31.40 kg/m2  SpO2 99%  LMP 02/28/2014  Breastfeeding? Unknown Heart: RRR Lungs: nl effort Inc: honeycomb intact;  dry Fundus firm  Plan for discharge: 12/15/14  Cam HaiSHAW, [place] Every, CNM 10:13 AM

## 2014-12-14 NOTE — Progress Notes (Signed)
Chaplains KentAlexis Smith and Leonette MostCharles Abdulkareem Badolato follow up visit. Pt requested a visit later in the day. Prayer and spiritual support provided.  Benjie Karvonenharles D. Mignonne Afonso, DMin, MDiv Chaplain

## 2014-12-14 NOTE — Progress Notes (Signed)
Patient has indwelling foley catheter for 10 days. Upon inspection of foley catheter there was nothing draining and patient felt that she needed to pee but could not. Bladder scanned patient and found that she was retaining greater than 500 ml of urine. Foley catheter was irrigated and a small clot was found. Emptied 575 of urine.

## 2014-12-14 NOTE — Progress Notes (Signed)
Tina [name] is a 34 y.o. [id number] POD#2 for rLTCS x7 complicated with a bladder laceration who recently started complaining of abdominal pain.  S: Ms. Tina [name] has been experiencing a LUQ and LLQ intense pain (10/10) intermittently for the past few hours. She describes the pain as both sharp and dull, and noting seems to make it better or worse. Of note, she felt the urge to urinate even with a foley catheter inserted, so the catheter was removed and the patient voided. A larger catheter was reinserted. She denies any dizziness, syncope, nausea, or vomiting. She also notes that she has never had indigestion issues before, and had no issues with constipation after any of her other CS  O: Vitals: BP 107/57 mmHg  Pulse 98  Temp(Src) 98.3 F (36.8 C) (Oral)  Resp 18  Ht 5\' 4"  (1.626 m)  Wt 83.008 kg (183 lb)  BMI 31.40 kg/m2  SpO2 99%    PE: General: Alert, cooperative, no distress CV: RRR, normal S1/S2, no m/r/g Respiratory: CTAB, normal work of breathing GI: Abdomen slightly distended, +BS, tenderness R side, bilateral rebound tenderness GU: Foley still inserted, slight blood tinged urine in bag, clear in tube  A&P  Tina [name] is a 34 y.o. [id number] POD#2 for rLTCS x7 complicated with a bladder laceration who recently started complaining of abdominal pain, appearing well and in no acute distress  #Pain - MgOH2 given for potential indigestion and constipation. Increase in px med not indicated at this point  #Abd tenderness/rebound tenderness - Will continue to closely monitor for any signs of intraabdominal hemorrhage or     OB fellow attestation:  I have seen and examined this patient; I agree with above documentation in the medical student's note.   No nausea/vomiting, some abdominal pain.  Foley changed earlier today 2/2 urination around foley  BP 125/68 mmHg  Pulse 108  Temp(Src) 97.9 F (36.6 C) (Oral)  Resp 18  Ht 5\' 4"  (1.626 m)  Wt 183 lb (83.008 kg)  BMI  31.40 kg/m2  SpO2 99%  LMP 02/28/2014  Breastfeeding? Unknown Abdomen soft, somewhat distended, mild rebound tenderness, normoactive bowel sounds  #milk of mag 2/2 ?constipation, will monitor closely  Perry MountACOSTA,Ambre Kobayashi ROCIO, MD 8:19 PM

## 2014-12-15 ENCOUNTER — Ambulatory Visit (HOSPITAL_COMMUNITY)

## 2014-12-15 MED ORDER — IBUPROFEN 600 MG PO TABS: 600.0000 mg | ORAL_TABLET | Freq: Four times a day (QID) | ORAL | Status: DC

## 2014-12-15 MED ORDER — DOCUSATE SODIUM 100 MG PO CAPS: 100.0000 mg | ORAL_CAPSULE | Freq: Two times a day (BID) | ORAL | Status: DC

## 2014-12-15 MED ORDER — OXYCODONE-ACETAMINOPHEN 5-325 MG PO TABS: 1.0000 | ORAL_TABLET | ORAL | Status: DC | PRN

## 2014-12-15 MED ORDER — IBUPROFEN 600 MG PO TABS: 600.0000 mg | ORAL_TABLET | Freq: Four times a day (QID) | ORAL | Status: DC | PRN

## 2014-12-15 MED ORDER — ACETAMINOPHEN-CODEINE #3 300-30 MG PO TABS: 1.0000 | ORAL_TABLET | Freq: Four times a day (QID) | ORAL | Status: DC | PRN

## 2014-12-15 NOTE — Discharge Summary (Signed)
Obstetric Discharge Summary Reason for Admission: repeat cesarean section x7 Prenatal Procedures: none Intrapartum Procedures: cesarean: low cervical, transverse Postpartum Procedures: none Complications-Operative and Postpartum: bladder laceration   FINDINGS: Viable female infant in cephalic presentation. Apgars 9 and 9. Clear amniotic fluid. Intact placenta, three vessel cord. Fallopian tubes and ovaries normal bilaterally. Scarring of the lower uterine segment. There were adhesions of the bladder to the anterior uterine wall beyond the lower uterine segment. The lower uterine segment was not thinned out. The fascia was completely scarred requiring a full thickness closure.  Hospital Course:  Active Problems:   Previous cesarean section complicating pregnancy   Tina [name] is a 34 y.o. [phone] s/p rLTCS with BTL complicated with a bladder laceration. She has an indwelling foley for 1 week. Patient was admitted on 12/12/2014.  She has a normal postpartum course.  Today: No acute events overnight.  Pt denies problems with po intake. Pt reports lower abdominal pain with ambulating.  She reports mild nausea, but no vomiting.  Pain is moderately controlled.  She has had flatus. She has not had bowel movement.  Lochia Minimal.  Plan for birth control is bilateral tubal ligation., which she had during her cs.  Method of Feeding: bottle   After discussing plans for discharge, pt endorses L and R lower quadrant pain above her incision and states that she is not ready to leave the hospital yet. However, concerns out of proportion to exam.  The pain started yesterday and pt describes it as sharp. Nothing seems to make it better and moving/walking seem to make it worse. Pain medication does not seem to help with the pain. Pt also endorses constipation, with which she has had no issues prior to the cesarean section.     Physical Exam:  General: alert, cooperative and no distress   Abdomen: mild distention, no rebound tenderness, no other peritoneal signs, no guarding, + bowel sounds  GU: urine clear and yellow  Lochia: appropriate Uterine Fundus: firm, midline  Incision: healing well, no significant drainage, dressing dry and intact  DVT Evaluation: No evidence of DVT seen on physical exam. Negative Homan's sign.  H/H: Lab Results  Component Value Date/Time   HGB 8.9* 12/13/2014 06:00 AM   HCT 26.9* 12/13/2014 06:00 AM   Urine output: 4575 (2.193mL/kg/hr)   Discharge Diagnoses: Term Pregnancy-delivered  S.P RLTCS with bladder laceration, repaired  Discharge Information: Date: 12/15/2014 Activity: pelvic rest Diet: routine  Medications: Marland Kitchen.Marland Kitchen.Marland Kitchen.percocet and ibuprofen Breast feeding:  No: Bottle  Condition: stable Instructions: refer to handout Discharge to: facility (jail)      Medication List    STOP taking these medications        acetaminophen 500 MG tablet  Commonly known as:  TYLENOL     aspirin EC 81 MG tablet     docusate sodium 100 MG capsule  Commonly known as:  COLACE     MYLANTA PO     Prenatal Vitamins 0.8 MG tablet      TAKE these medications        ibuprofen 600 MG tablet  Commonly known as:  ADVIL,MOTRIN  Take 1 tablet (600 mg total) by mouth every 6 (six) hours.     oxyCODONE-acetaminophen 5-325 MG per tablet  Commonly known as:  PERCOCET/ROXICET  Take 1 tablet by mouth every 4 (four) hours as needed (for pain scale 4-7).           Follow-up Information    [facility]  In 5 weeks.   Specialty:  Obstetrics and Gynecology   Why:  for post partum check up and a sugar challenge test   Contact information:   [address] [place] [address] [phone]      Foley can be removed by jail nurse in 5 days  CRESENZO-DISHMAN,Madisson Kulaga

## 2014-12-15 NOTE — Discharge Instructions (Signed)
Cesarean Delivery  Cesarean delivery is the birth of a baby through a cut (incision) in the abdomen and womb (uterus).  LET YOUR HEALTH CARE PROVIDER KNOW ABOUT:  All medicines you are taking, including vitamins, herbs, eye drops, creams, and over-the-counter medicines.  Previous problems you or members of your family have had with the use of anesthetics.  Any blood disorders you have.  Previous surgeries you have had.  Medical conditions you have.  Any allergies you have.  Complicationsinvolving the pregnancy. RISKS AND COMPLICATIONS  Generally, this is a safe procedure. However, as with any procedure, complications can occur. Possible complications include:  Bleeding.  Infection.  Blood clots.  Injury to surrounding organs.  Problems with anesthesia.  Injury to the baby. BEFORE THE PROCEDURE   You may be given an antacid medicine to drink. This will prevent acid contents in your stomach from going into your lungs if you vomit during the surgery.  You may be given an antibiotic medicine to prevent infection. PROCEDURE   Hair may be removed from your pubic area and your lower abdomen. This is to prevent infection in the incision site.  A tube (Foley catheter) will be placed in your bladder to drain your urine from your bladder into a bag. This keeps your bladder empty during surgery.  An IV tube will be placed in your vein.  You may be given medicine to numb the lower half of your body (regional anesthetic). If you were in labor, you may have already had an epidural in place which can be used in both labor and cesarean delivery. You may possibly be given medicine to make you sleep (general anesthetic) though this is not as common.  An incision will be made in your abdomen that extends to your uterus. There are 2 basic kinds of incisions:  The horizontal (transverse) incision. Horizontal incisions are from side to side and are used for most routine cesarean  deliveries.  The vertical incision. The vertical incision is from the top of the abdomen to the bottom and is less commonly used. It is often done for women who have a serious complication (extreme prematurity) or under emergency situations.  The horizontal and vertical incisions may both be used at the same time. However, this is very uncommon.  An incision is then made in your uterus to deliver the baby.  Your baby will then be delivered.  Both incisions are then closed with absorbable stitches. AFTER THE PROCEDURE   If you were awake during the surgery, you will see your baby right away. If you were asleep, you will see your baby as soon as you are awake.  You may breastfeed your baby after surgery.  You may be able to get up and walk the same day as the surgery. If you need to stay in bed for a period of time, you will receive help to turn, cough, and take deep breaths after surgery. This helps prevent lung problems such as pneumonia.  Do not get out of bed alone the first time after surgery. You will need help getting out of bed until you are able to do this by yourself.  You may be able to shower the day after your cesarean delivery. After the bandage (dressing) is taken off the incision site, a nurse will assist you to shower if you would like help.  You will have pneumatic compression hose placed on your lower legs. This is done to prevent blood clots. When you are up   and walking regularly, they will no longer be necessary.  Do not cross your legs when you sit.  Save any blood clots that you pass. If you pass a clot while on the toilet, do not flush it. Call for the nurse. Tell the nurse if you think you are bleeding too much or passing too many clots.  You will be given medicine as needed. Let your health care providers know if you are hurting. You may also be given an antibiotic to prevent an infection.  Your IV tube will be taken out when you are drinking a reasonable  amount of fluids. The Foley catheter is taken out when you are up and walking.  If your blood type is Rh negative and your baby's blood type is Rh positive, you will be given a shot of anti-D immune globulin. This shot prevents you from having Rh problems with a future pregnancy. You should get the shot even if you had your tubes tied (tubal ligation).  If you are allowed to take the baby for a walk, place the baby in the bassinet and push it. Do not carry your baby in your arms. Document Released: 08/11/2005 Document Revised: 06/01/2013 Document Reviewed: 03/02/2013 Exit[employer] Patient Information 2015 [employer]. This information is not intended to replace advice given to you by your health care provider. Make sure you discuss any questions you have with your health care provider.  

## 2014-12-15 NOTE — Progress Notes (Signed)
I spent time with patient allowing her to grieve the upcoming separation from her daughter.  She is relieved to know that her daughter will be placed with a family member.  She shared with me the concerns that she raised with Tilford Pillarhaplain Charlie Lumpkin last night about her 32 year old son whom she thinks is being abused by his foster parents.  I consulted with Loleta BooksSarah Venning, LCSW about this as well.  We are providing Tina [name] with footprints, a baby outfit and a lock of hair (with permission from CPS) and copies of her pictures of her baby.  Her guard stated that she would be able to bring that with her from the hospital.    I provided additional grief, emotional and spiritual support as well as our phone number at the office for follow up grief support.  [address]Chaplain Katy [place]laussen Pager, [phone] 11:47 AM    12/15/14 1100  Clinical Encounter Type  Visited With Patient  Visit Type Spiritual support  Referral From Chaplain;Social work;Nurse

## 2014-12-15 NOTE — Progress Notes (Signed)
CSW received phone call from Tina [name], CPS foster care worker.   Per CPS, infant will be discharged to Tina [name] (cousin of MOB) when medically aware.  CPS has informed foster mother need to bring identification, a car seat, and pediatrician follow-up appointment.  Per CPS, foster mother will pick up infant around 5:00pm this evening (4/22).   Nursing staff to obtain copy of identification to verify identify and will complete discharge teaching with this individual.   CSW has received a placement letter from CPS, copy has been given to CN.  Placement letter highlights the phone number and address of the foster.  Letter to be scanned into infant's chart.  No barriers to discharge.   CSW continues to provide support and opportunities for MOB to process how she feels as she anticipates her discharge.  She stated that she feels better knowing that the infant will be discharged into the care of her cousin. CSW continues to assist the MOB to identify previous strengths/coping skills that she can transfer to her upcoming separation.  MOB continues to express appreciation for ongoing support.  

## 2014-12-16 LAB — TYPE AND SCREEN
ABO/RH(D): A NEG
Antibody Screen: POSITIVE
DAT, IgG: NEGATIVE
UNIT DIVISION: 0
Unit division: 0
Unit division: 0
Unit division: 0

## 2014-12-22 ENCOUNTER — Ambulatory Visit: Payer: Self-pay | Admitting: Obstetrics & Gynecology

## 2014-12-25 ENCOUNTER — Inpatient Hospital Stay (HOSPITAL_COMMUNITY): Admission: RE | Admit: 2014-12-25 | Source: Ambulatory Visit

## 2015-01-15 ENCOUNTER — Ambulatory Visit: Admitting: Obstetrics & Gynecology

## 2015-01-17 ENCOUNTER — Ambulatory Visit (INDEPENDENT_AMBULATORY_CARE_PROVIDER_SITE_OTHER): Payer: Self-pay | Admitting: Obstetrics & Gynecology

## 2015-01-17 ENCOUNTER — Encounter: Payer: Self-pay | Admitting: Obstetrics & Gynecology

## 2015-01-17 VITALS — BP 109/73 | HR 88 | Temp 98.3°F | Resp 20 | Wt 165.2 lb

## 2015-01-17 DIAGNOSIS — K5900 Constipation, unspecified: Secondary | ICD-10-CM

## 2015-01-17 MED ORDER — POLYETHYLENE GLYCOL 3350 17 G PO PACK: 17.0000 g | PACK | Freq: Every day | ORAL | Status: DC | PRN

## 2015-01-17 NOTE — Progress Notes (Signed)
Subjective:     Tina [name] is a 34 y.o. female who presents for a postpartum visit. She is 5 weeks postpartum following a cesarean delivery w/BTS and bladder laceration}. I have fully reviewed the prenatal and intrapartum course. The delivery was at 37 gestational weeks. Outcome: viable female, apgar 9/9 . Anesthesia: combined spinal epidural. Postpartum course has been normal. Baby is living with and cared for by a family member. Baby is feeding by bottle. Bleeding staining only. Bowel function is constipated. Bladder function is normal however pt states she cannot tell when she needs to urinate. Patient is not sexually active. Pt is currently incarcerated. Contraception method is bilateral tubal sterilization.  Postpartum depression screening: score 22.  Pt c./o constipation since delivery.  She reports eating lots of fruits and veggies.    The following portions of the patient's history were reviewed and updated as appropriate: allergies, current medications, past family history, past medical history, past social history, past surgical history and problem list.  Review of Systems Pertinent items are noted in HPI.   Objective:    BP 109/73 mmHg  Pulse 88  Temp(Src) 98.3 F (36.8 C) (Oral)  Resp 20  Wt 165 lb 3.2 oz (74.934 kg)  Breastfeeding? No       Pt in NAD Abd; soft, NT, ND    Assessment:     6 weeks postpartum exam. Pap smear not done at today's visit.   GDM in pregnancy needs 2 hour GTT Constipation- will Rx Miralax daily prn  Plan:    1. Contraception: tubal ligation 2. Constipation: Miralax prn 3. Needs 2 hours GTT 4.Follow up in: 1 year or as needed.

## 2015-01-17 NOTE — Patient Instructions (Signed)

## 2015-01-24 ENCOUNTER — Other Ambulatory Visit

## 2015-01-25 ENCOUNTER — Other Ambulatory Visit

## 2015-01-25 ENCOUNTER — Encounter: Payer: Self-pay | Admitting: Family Medicine

## 2015-01-30 ENCOUNTER — Other Ambulatory Visit

## 2015-02-06 ENCOUNTER — Other Ambulatory Visit: Payer: Self-pay

## 2015-02-06 DIAGNOSIS — O24429 Gestational diabetes mellitus in childbirth, unspecified control: Secondary | ICD-10-CM

## 2015-02-07 LAB — GLUCOSE TOLERANCE, 2 HOURS
Glucose, 2 hour: 160 mg/dL — ABNORMAL HIGH (ref 70–139)
Glucose, Fasting: 111 mg/dL — ABNORMAL HIGH (ref 70–99)

## 2015-02-09 ENCOUNTER — Telehealth: Payer: Self-pay

## 2015-02-09 NOTE — Telephone Encounter (Signed)
-----   Message from Levie Heritage, DO sent at 02/08/2015 11:54 AM EDT ----- Has impaired glucose tolerance (prediabetic).  Needs PCP to follow this as she is at risk of developing type 2 diabetes.

## 2015-02-09 NOTE — Telephone Encounter (Addendum)
Called pt at [phone] and # is not working.  Called [phone] and LM to return call to the clinics.  **Pt is incarcerated @ [facility].  Diane Day RNC

## 2015-03-02 NOTE — Telephone Encounter (Signed)
[name] [phone].  Left detailed message for Tina [name] regarding patient's risk for developing type 2 Diabetes.

## 2015-06-27 ENCOUNTER — Encounter: Payer: Self-pay | Admitting: *Deleted

## 2015-07-16 IMAGING — US US OB DETAIL+14 WK
1 series · 13 of 28 positions shown · non-contrast
Comparison: none

[Series 1: us ob detail+14 wk · 0.11mm/px · 13 of 44 slices shown]
[im 2/44]
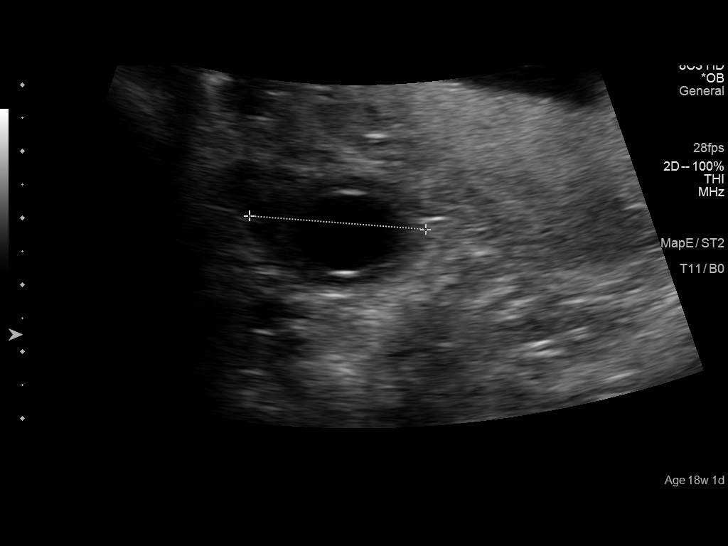
[im 5/44]
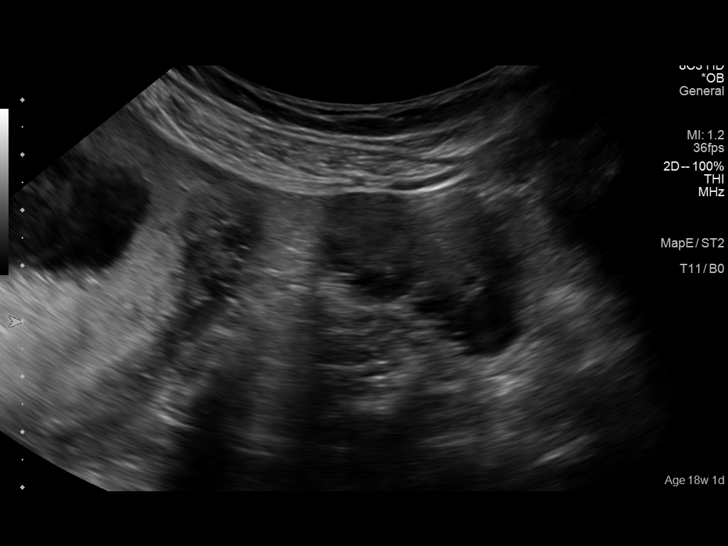
[im 8/44]
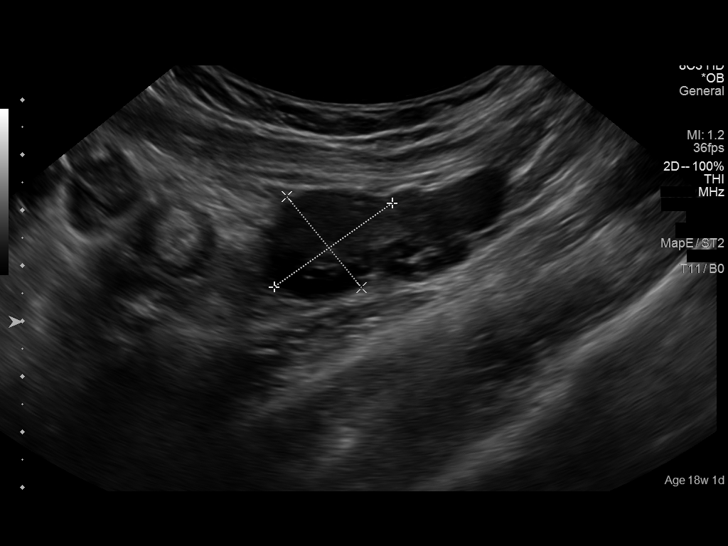
[im 12/44]
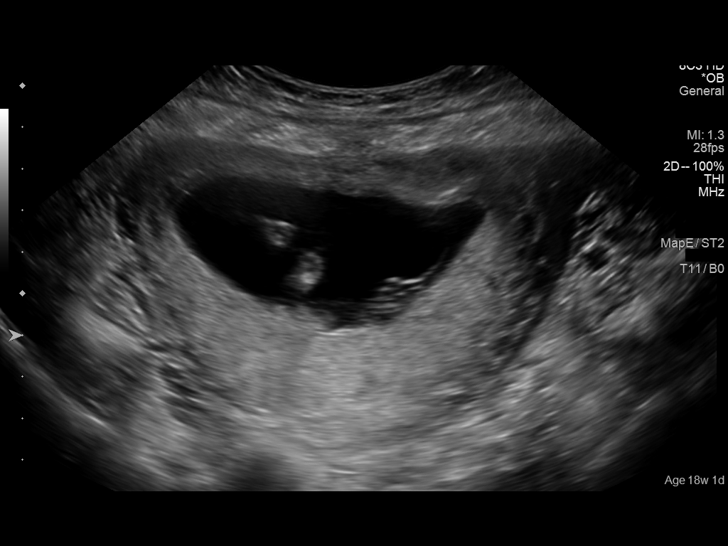
[im 15/44]
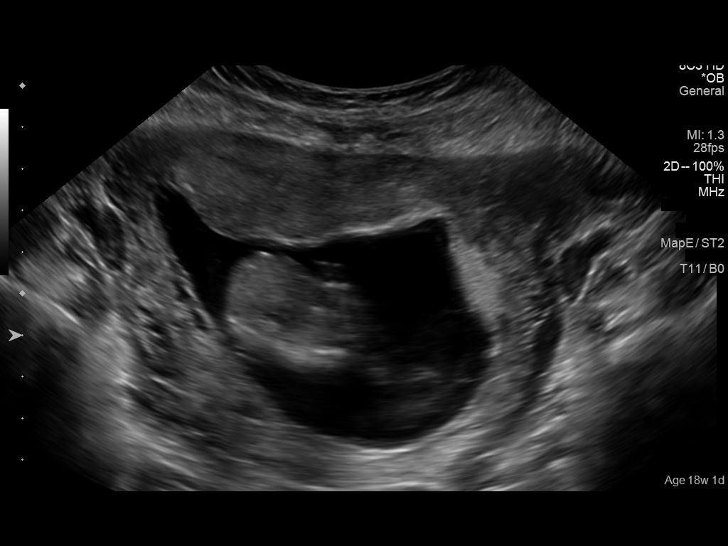
[im 18/44]
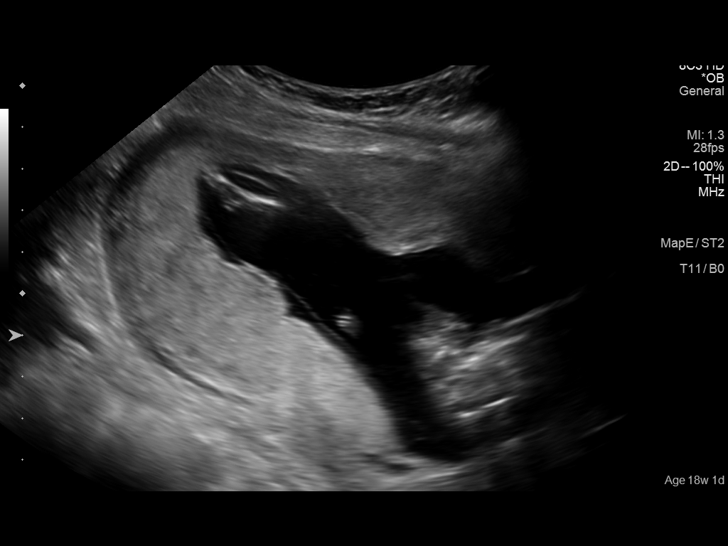
[im 23/44]
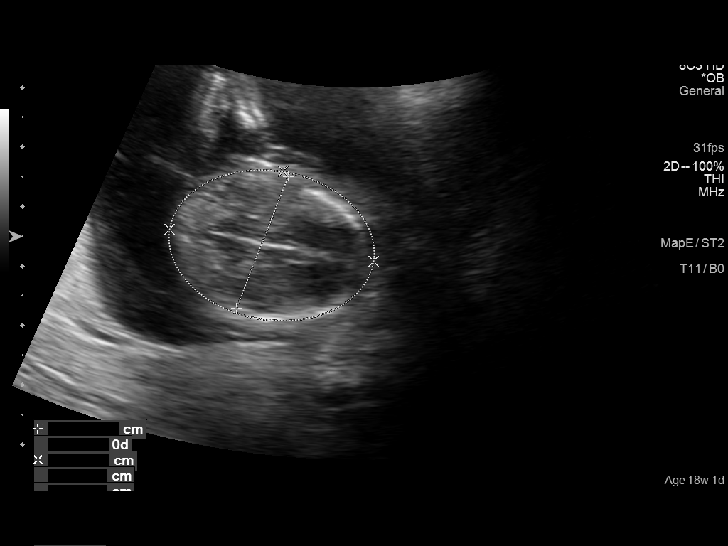
[im 26/44]
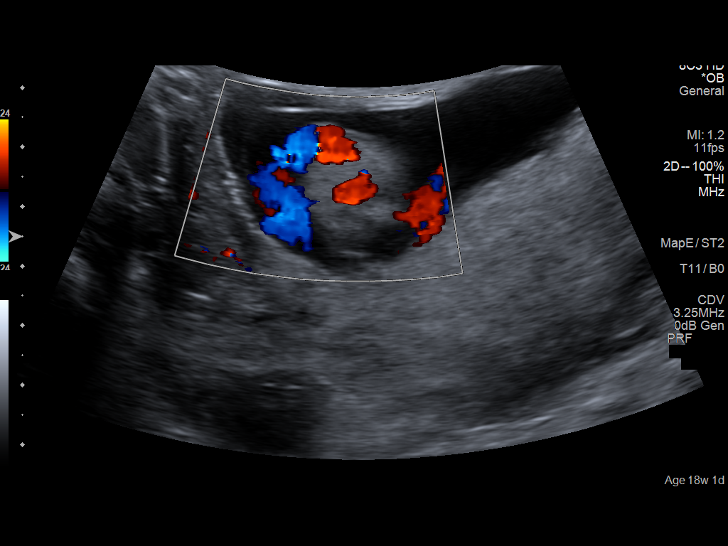
[im 29/44]
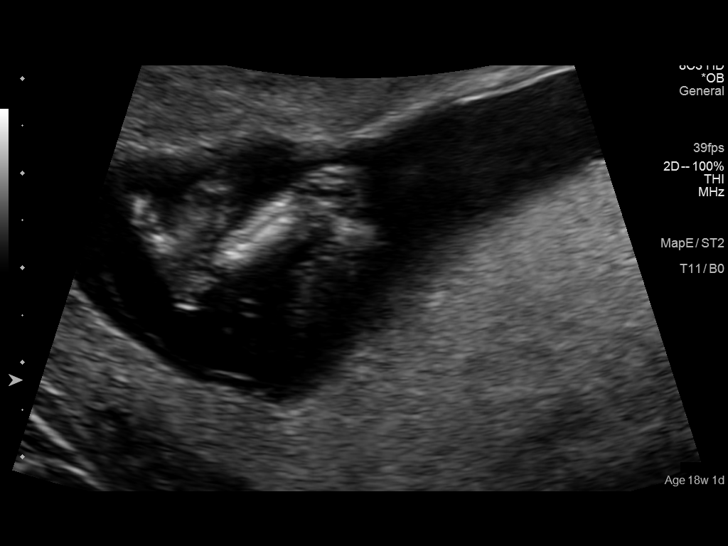
[im 32/44]
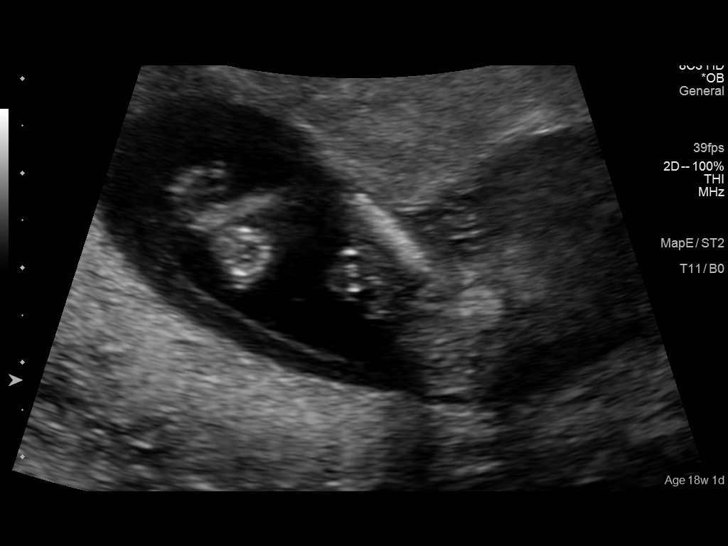
[im 36/44]
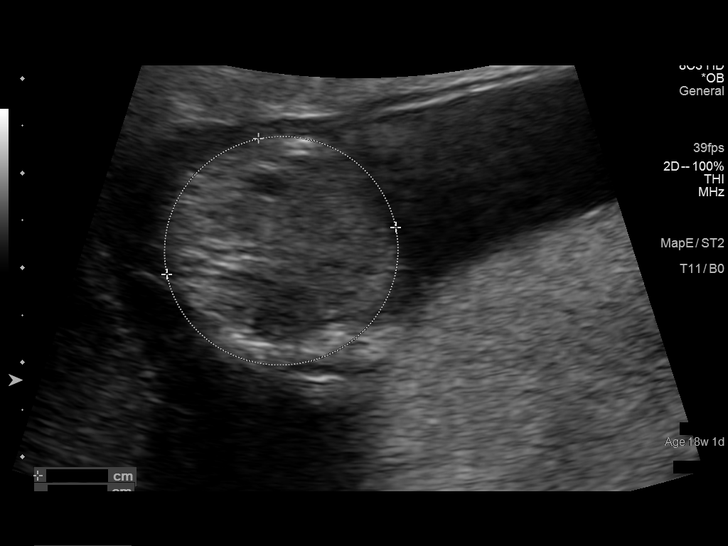
[im 39/44]
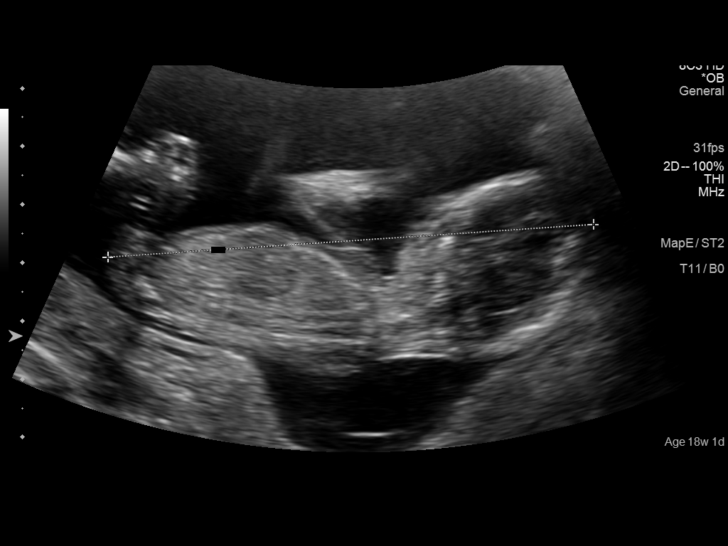
[im 42/44]
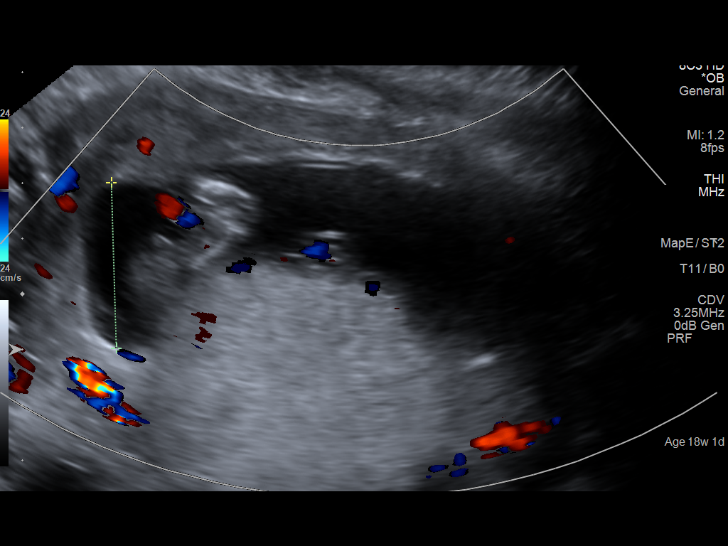

[13 of 28 positions shown; findings below may reference images not displayed]

OBSTETRICS REPORT
                      (Signed Final 07/05/2014 [DATE])

Service(s) Provided

 US OB DETAIL + 14 WK                                  76811.0
Indications

 14 weeks gestation of pregnancy
 Uncertain LMP  Establish Gestational Age              Z36
 Abnormal biochemical screen (quad) for Trisomy
 21
 Previous cesarean section(times ^)
 Cigarette smoker
 Drug dependence complicating
 pregnancy,antepartum condition or
 complication(Cannabis)
 Gestational diabetes in pregnancy, diet controlled
Fetal Evaluation

 Num Of Fetuses:    1
 Fetal Heart Rate:  139                          bpm
 Cardiac Activity:  Observed
 Presentation:      Cephalic
 Placenta:          Posterior, above cervical
                    os
 P. Cord            Visualized, central
 Insertion:

 Amniotic Fluid
 AFI FV:      Subjectively within normal limits
                                             Larg Pckt:     3.0  cm
Biometry

 CRL:     83.7  mm     G. Age:  13w 6d                 EDD:    01/04/15
 BPD:     24.7  mm     G. Age:  14w 2d                CI:        71.13   70 - 86
                                                      FL/HC:
 HC:      93.3  mm     G. Age:  14w 2d                HC/AC:      1.20   1.14 -

 AC:      77.9  mm     G. Age:  14w 1d                FL/BPD:
 FL:      12.4  mm     G. Age:  13w 4d                FL/AC:      15.9   20 - 24

 Est. FW:      87  gm      0 lb 3 oz
Gestational Age
 LMP:           18w 1d        Date:  02/28/14                 EDD:   12/05/14
 U/S Today:     14w 1d                                        EDD:   01/02/15
 Best:          14w 1d     Det. By:  U/S (07/05/14)           EDD:   01/02/15
Anatomy

 Cranium:          Appears normal         Bladder:          Appears normal
 Choroid Plexus:   Appears normal         Lower             Visualized
                                          Extremities:
 Stomach:          Appears normal, left   Upper             Visualized
                   sided                  Extremities:
 Cord Vessels:     Appears normal (3
                   vessel cord)
Cervix Uterus Adnexa

 Cervical Length:    2.65     cm

 Cervix:       Normal appearance by transabdominal scan.
 Uterus:       No abnormality visualized.
 Cul De Sac:   No free fluid seen.
 Left Ovary:    Within normal limits.
 Right Ovary:   Within normal limits. Small corpus luteum noted.

 Adnexa:     No abnormality visualized.
Impression

 Single IUP at 14w 1d
 Abnormal quad screen
 Limited views of the fetal anatomy obtained due to early
 gestational age
 No gross anomalies noted
 Normal amniotic fluid volume
Recommendations

 Recommend adjusting EDD based on today's study.
 Will need to re-draw quad screen in the appropriate
 gestational age window (15-20 weeks gestation)
 Recommend ultrasound in 4-6 weeks for anatomy

## 2015-10-27 IMAGING — US US OB FOLLOW-UP
1 series · 12 of 28 positions shown · non-contrast
Comparison: none

[Series 1: us ob follow up · 12 of 57 slices shown]
[im 3/57]
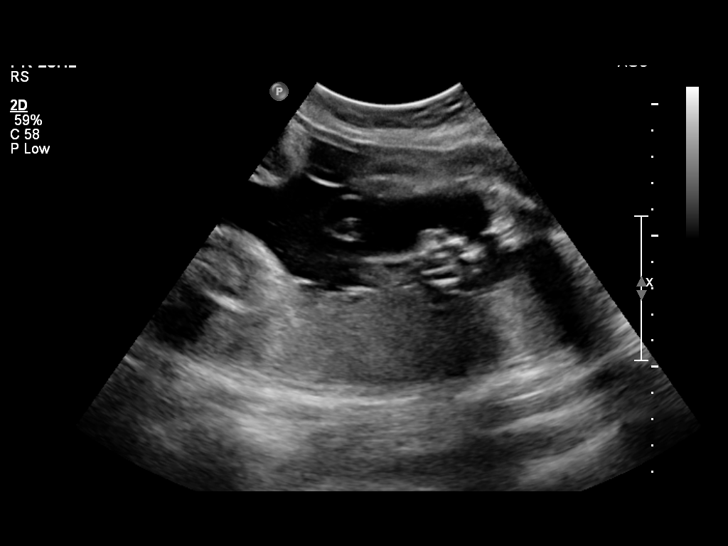
[im 7/57]
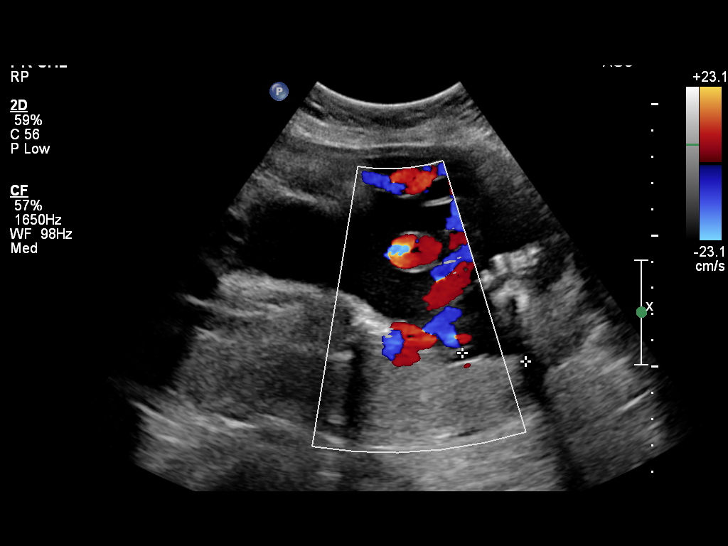
[im 11/57]
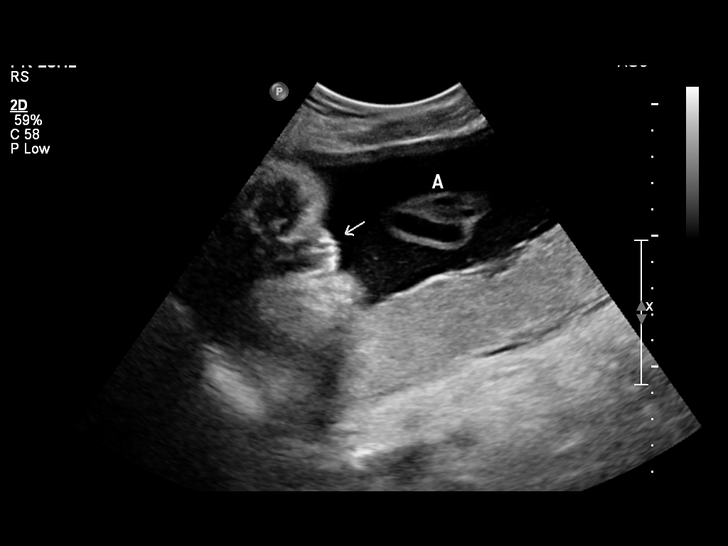
[im 17/57]
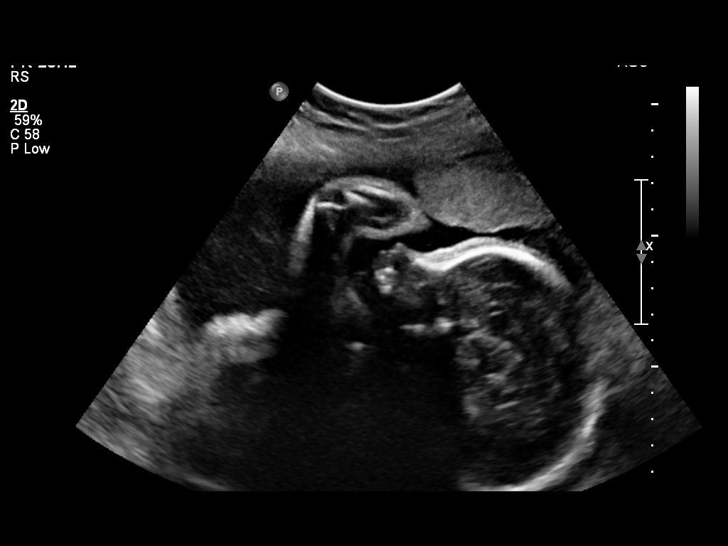
[im 21/57]
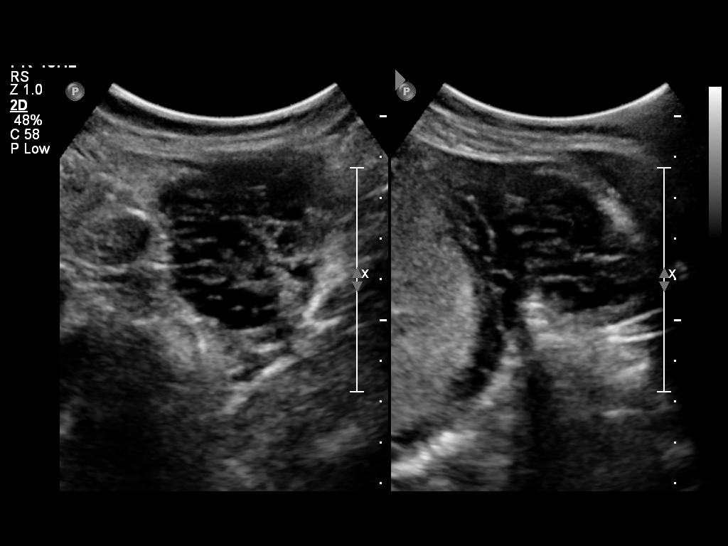
[im 25/57]
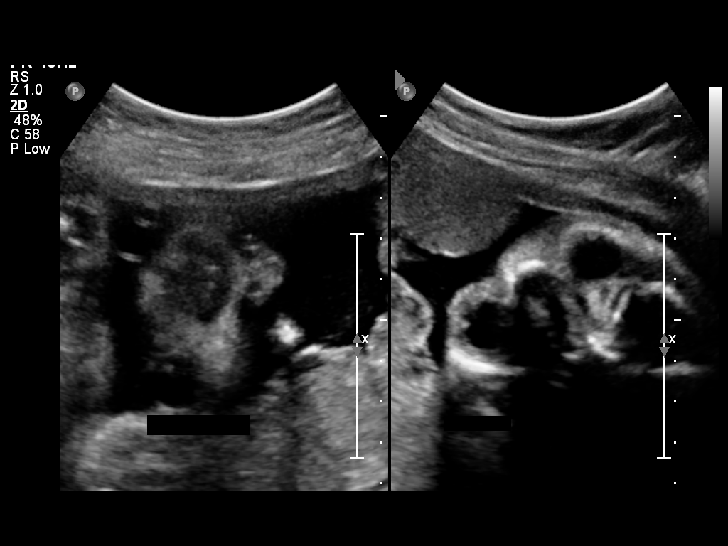
[im 32/57]
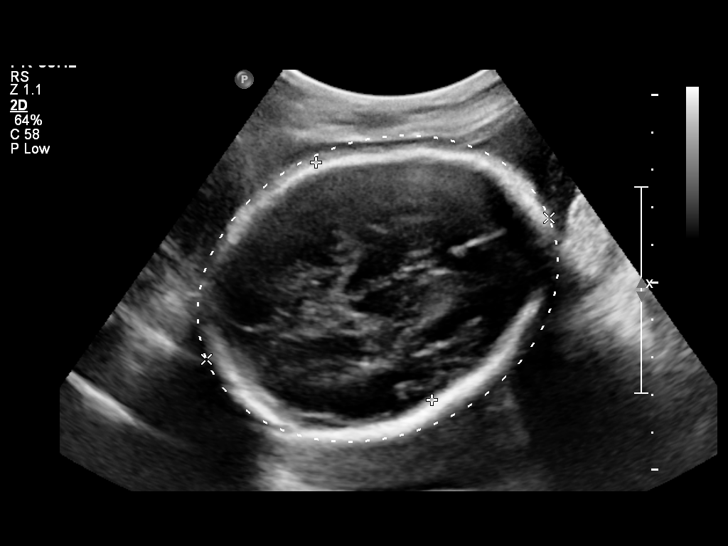
[im 36/57]
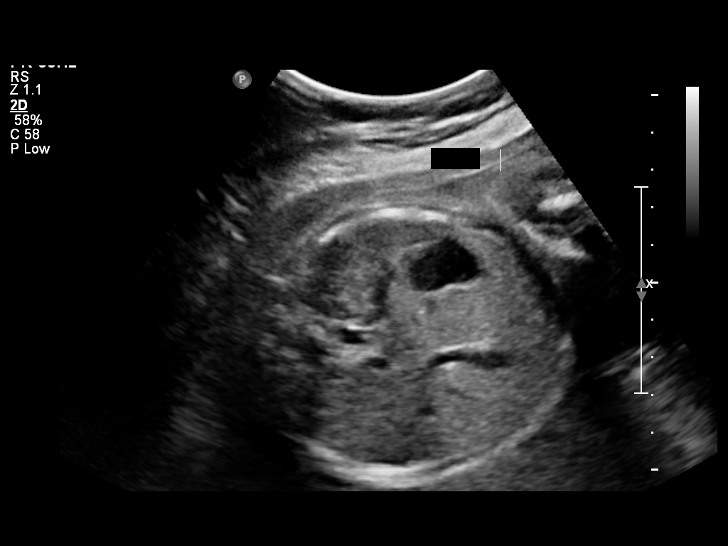
[im 40/57]
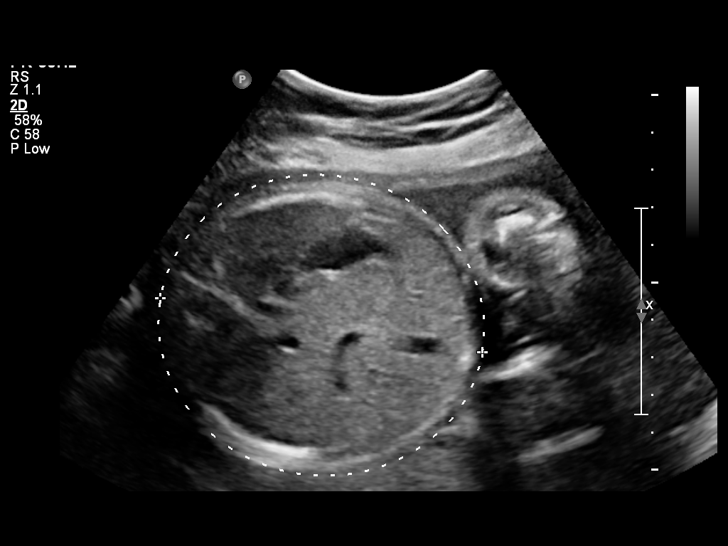
[im 46/57]
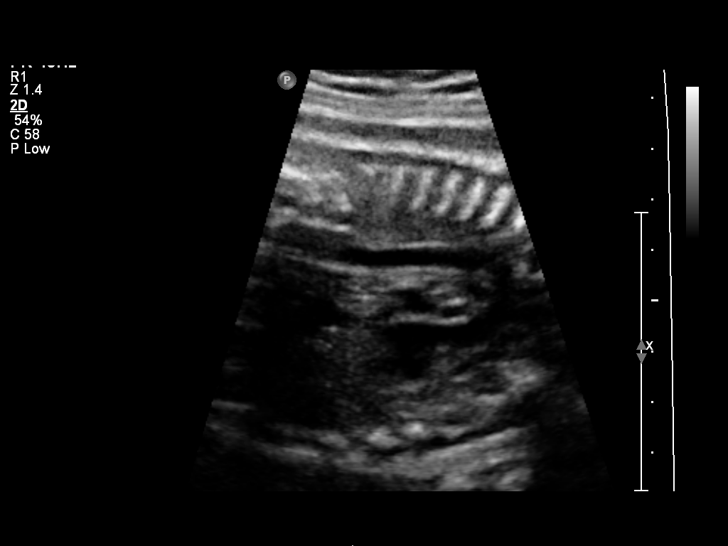
[im 50/57]
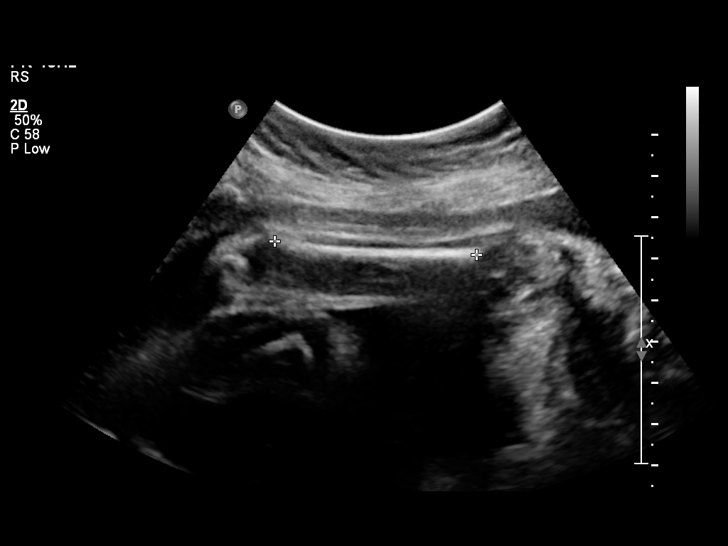
[im 54/57]
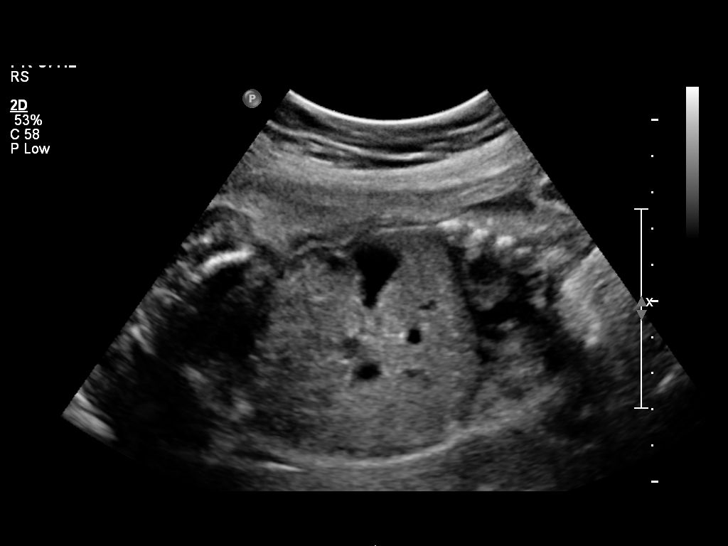

[12 of 28 positions shown; findings below may reference images not displayed]

OBSTETRICS REPORT
                      (Signed Final 10/16/2014 [DATE])

Service(s) Provided

 US OB FOLLOW UP                                       76816.1
Indications

 Abnormal biochemical screen (quad) for Trisomy
 21
 Previous cesarean section x 6
 Cigarette smoker
 Drug dependence complicating
 pregnancy,antepartum condition or
 complication(Cannabis)
 Gestational diabetes in pregnancy, diet controlled
 28 weeks gestation of pregnancy
 Follow-up incomplete fetal anatomic evaluation        Z36
Fetal Evaluation

 Num Of Fetuses:    1
 Fetal Heart Rate:  149                          bpm
 Cardiac Activity:  Observed
 Presentation:      Cephalic
 Placenta:          Posterior, above cervical
                    os
 P. Cord            Visualized, central
 Insertion:

 Amniotic Fluid
 AFI FV:      Subjectively within normal limits
 AFI Sum:     20.52   cm       82  %Tile     Larg Pckt:    5.51  cm
 RUQ:   5.51    cm   RLQ:    4.69   cm    LUQ:   5.19    cm   LLQ:    5.13   cm
Biometry

 BPD:     70.2  mm     G. Age:  28w 1d                CI:        65.26   70 - 86
                                                      FL/HC:      19.7   19.6 -

 HC:     279.1  mm     G. Age:  30w 4d       77  %    HC/AC:      1.08   0.99 -

 AC:     258.2  mm     G. Age:  30w 0d       82  %    FL/BPD:     78.3   71 - 87
 FL:        55  mm     G. Age:  29w 0d       49  %    FL/AC:      21.3   20 - 24
 HUM:     49.3  mm     G. Age:  29w 0d       52  %

 Est. FW:    9245  gm      3 lb 2 oz     71  %
Gestational Age

 LMP:           32w 6d        Date:  02/28/14                 EDD:   12/05/14
 U/S Today:     29w 3d                                        EDD:   12/29/14
 Best:          28w 4d     Det. By:  U/S C R L   (07/05/14)   EDD:   01/04/15
Anatomy

 Cranium:          Appears normal         Aortic Arch:      Appears normal
 Fetal Cavum:      Previously seen        Ductal Arch:      Previously seen
 Ventricles:       Appears normal         Diaphragm:        Appears normal
 Choroid Plexus:   Appears normal         Stomach:          Appears normal, left
                                                            sided
 Cerebellum:       Appears normal         Abdomen:          Appears normal
 Posterior Fossa:  Appears normal         Abdominal Wall:   Previously seen
 Nuchal Fold:      Not applicable (>20    Cord Vessels:     Appears normal (3
                   wks GA)                                  vessel cord)
 Face:             Appears normal         Kidneys:          Appear normal
                   (orbits and profile)
 Lips:             Appears normal         Bladder:          Appears normal
 Heart:            Appears normal         Spine:            Previously seen
                   (4CH, axis, and
                   situs)
 RVOT:             Appears normal         Lower             Previously seen
                                          Extremities:
 LVOT:             Appears normal         Upper             Previously seen
                                          Extremities:

 Other:  Fetus appears to be a female.
Impression

 SIUP at 28+4 weeks
 Normal interval anatomy; anatomic survey complete
 Normal amniotic fluid volume
 Appropriate interval growth with EFW at the 71st %tile
Recommendations

 Follow-up ultrasound for growth in 6 weeks

## 2016-04-24 ENCOUNTER — Encounter: Payer: Self-pay | Admitting: Medical Oncology

## 2016-04-24 ENCOUNTER — Emergency Department: Payer: No Typology Code available for payment source

## 2016-04-24 ENCOUNTER — Emergency Department
Admission: EM | Admit: 2016-04-24 | Discharge: 2016-04-24 | Disposition: A | Discharge status: Home / Self Care | Payer: No Typology Code available for payment source | Attending: Emergency Medicine | Admitting: Emergency Medicine

## 2016-04-24 DIAGNOSIS — S161XXA Strain of muscle, fascia and tendon at neck level, initial encounter: Secondary | ICD-10-CM | POA: Insufficient documentation

## 2016-04-24 DIAGNOSIS — S199XXA Unspecified injury of neck, initial encounter: Secondary | ICD-10-CM | POA: Diagnosis present

## 2016-04-24 DIAGNOSIS — Z87891 Personal history of nicotine dependence: Secondary | ICD-10-CM | POA: Insufficient documentation

## 2016-04-24 DIAGNOSIS — S39012A Strain of muscle, fascia and tendon of lower back, initial encounter: Secondary | ICD-10-CM | POA: Diagnosis not present

## 2016-04-24 DIAGNOSIS — Y999 Unspecified external cause status: Secondary | ICD-10-CM | POA: Diagnosis not present

## 2016-04-24 DIAGNOSIS — Y939 Activity, unspecified: Secondary | ICD-10-CM | POA: Insufficient documentation

## 2016-04-24 DIAGNOSIS — Y92511 Restaurant or cafe as the place of occurrence of the external cause: Secondary | ICD-10-CM | POA: Diagnosis not present

## 2016-04-24 DIAGNOSIS — Z79899 Other long term (current) drug therapy: Secondary | ICD-10-CM | POA: Diagnosis not present

## 2016-04-24 MED ORDER — OXYCODONE-ACETAMINOPHEN 5-325 MG PO TABS: 1.0000 | ORAL_TABLET | Freq: Four times a day (QID) | ORAL | 0 refills | Status: DC | PRN

## 2016-04-24 MED ORDER — IBUPROFEN 800 MG PO TABS
800.0000 mg | ORAL_TABLET | Freq: Once | ORAL | Status: AC
  Administered 2016-04-24: 800 mg via ORAL
  Filled 2016-04-24: qty 1

## 2016-04-24 MED ORDER — IBUPROFEN 800 MG PO TABS: 800.0000 mg | ORAL_TABLET | Freq: Three times a day (TID) | ORAL | 0 refills | Status: DC | PRN

## 2016-04-24 MED ORDER — CYCLOBENZAPRINE HCL 10 MG PO TABS: 10.0000 mg | ORAL_TABLET | Freq: Three times a day (TID) | ORAL | 0 refills | Status: DC | PRN
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
## 2016-04-24 NOTE — Discharge Instructions (Signed)
Take pain medicine as directed. The exercises as pain improves. Follow-up with orthopedics if not improving.  [facility] for further evaluation of STD exposure.
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
## 2016-07-14 ENCOUNTER — Emergency Department
Admission: EM | Admit: 2016-07-14 | Discharge: 2016-07-14 | Disposition: A | Discharge status: Home / Self Care | Attending: Emergency Medicine | Admitting: Emergency Medicine

## 2016-07-14 DIAGNOSIS — Z79899 Other long term (current) drug therapy: Secondary | ICD-10-CM | POA: Insufficient documentation

## 2016-07-14 DIAGNOSIS — B86 Scabies: Secondary | ICD-10-CM

## 2016-07-14 DIAGNOSIS — R21 Rash and other nonspecific skin eruption: Secondary | ICD-10-CM

## 2016-07-14 DIAGNOSIS — Z87891 Personal history of nicotine dependence: Secondary | ICD-10-CM | POA: Insufficient documentation

## 2016-07-14 LAB — RAPID HIV SCREEN (HIV 1/2 AB+AG)
HIV 1/2 ANTIBODIES: NONREACTIVE
HIV-1 P24 Antigen - HIV24: NONREACTIVE

## 2016-07-14 MED ORDER — PERMETHRIN 5 % EX CREA: TOPICAL_CREAM | CUTANEOUS | 1 refills | Status: AC

## 2016-07-14 NOTE — ED Provider Notes (Addendum)
[facility] Emergency Department Provider Note  Time seen: 2:18 PM  I have reviewed the triage vital signs and the nursing notes.   HISTORY  Chief Complaint Rash    HPI Tina [name] is a 35 y.o. female presents to the emergency department with a generalized rash. According to the patient for the past 5 weeks she has experienced a generalized rash started on her extremities now involves her entire body, but appears to spare the face. States the rash is extremely itchy. She was seen at [facility] for the same nearly 3 weeks ago at that time she states she had STD testing that was negative. They put her on steroids and triamcinolone cream. Patient states no improvement with that treatment. Patient presents back to the emergency department today for continued itching and continued rash.  Past Medical History:  Diagnosis Date  . Abnormal Pap smear    2003 ASC-US 2005 ASC-US LSIL high risk HPV;LAST PAP A WHILE AGO  . Acid reflux   . 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 Depression    NO MEDS  . Gestational diabetes 2008   NO MEDS;DIET CONTROLLED  . Gestational diabetes mellitus 2012   NO MEDS; DIET CONTROLLED  . Gonorrhea   . H/O bacterial infection   . H/O migraine   . H/O varicella   . Headache(784.0)    Frequent  . History of bilateral tubal ligation 12/12/14  . Hx of chlamydia infection   . Postpartum depression   . 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 Status post cesarean delivery 04/23/2012  . Trichimoniasis     Patient Active Problem List   Diagnosis Date Noted  . Previous cesarean section complicating pregnancy 12/13/2014  . Diet controlled gestational diabetes mellitus in third trimester   . Drug dependence in pregnancy (HCC)   . 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 Uterine scar from previous cesarean delivery, antepartum   . Cigarette smoker   . Fetal arrhythmia affecting pregnancy,  antepartum   . GDM (gestational diabetes mellitus)   . Rh alloimmunization, maternal, antepartum   . 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 Tobacco use during pregnancy   . Abnormal quad screen 06/28/2014  . Gestational diabetes mellitus, antepartum 06/20/2014  . Incarcerated status of pregnant patient 06/19/2014  . Previous cesarean delivery X 6 affecting pregnancy, antepartum 06/19/2014  . Cannabis abuse 03/21/2011    Past Surgical History:  Procedure Laterality Date  . CESAREAN SECTION  2005   x4;FIRST CHILD IN FETAL DISTRESS  . CESAREAN SECTION  04/23/2012   Procedure: CESAREAN SECTION;  Surgeon: Purcell Nails, MD;  Location: [facility];  Service: Obstetrics;  Laterality: N/A;  . CESAREAN SECTION N/A 05/03/2013   Procedure: REPEAT CESAREAN SECTION WITH POSS ABDOMINAL HYSTERECTOMY;  Surgeon: Purcell Nails, MD;  Location: [facility];  Service: Obstetrics;  Laterality: N/A;  . CESAREAN SECTION N/A 12/12/2014   Procedure: CESAREAN SECTION REPEAT;  Surgeon: Willodean Rosenthal, MD;  Location: [facility];  Service: Obstetrics;  Laterality: N/A;  Tracie S confirmed FA  . TUBAL LIGATION      Prior to Admission medications   Medication Sig Start Date End Date Taking? Authorizing Provider  cyclobenzaprine (FLEXERIL) 10 MG tablet Take 1 tablet (10 mg total) by mouth 3 (three) times daily as needed for muscle spasms. 04/24/16   Ignacia Bayley, PA-C  docusate sodium (COLACE) 100 MG capsule Take 1 capsule (100 mg total) by  mouth 2 (two) times daily. Patient not taking: Reported on 01/17/2015 12/15/14   Fredirick LatheKristy Acosta, MD  ibuprofen (ADVIL,MOTRIN) 800 MG tablet Take 1 tablet (800 mg total) by mouth every 8 (eight) hours as needed. 04/24/16   Ignacia Bayleyobert Tumey, PA-C  oxyCODONE-acetaminophen (ROXICET) 5-325 MG tablet Take 1 tablet by mouth every 6 (six) hours as needed for severe pain. 04/24/16   Ignacia Bayleyobert Tumey, PA-C  polyethylene glycol (MIRALAX / GLYCOLAX) packet  Take 17 g by mouth daily as needed for moderate constipation. 01/17/15   Willodean Rosenthalarolyn Harraway-Smith, MD    No Known Allergies  Family History  Problem Relation Age of Onset  . Hypertension Paternal Grandfather   . COPD Mother     Bronchitis  . Asthma Brother   . COPD Brother     Bronchitis  . Hypertension Paternal Grandmother   . Other Paternal Grandmother     SPIDER VEINS  . Other Paternal Aunt     SPIDER VEINS  . Seizures Cousin     PATERNAL  . Anesthesia problems Neg Hx   . Hypotension Neg Hx   . Malignant hyperthermia Neg Hx   . Pseudochol deficiency Neg Hx     Social History Social History  Substance Use Topics  . Smoking status: Former Smoker    Packs/day: 0.25    Types: Cigarettes    Quit date: 05/24/2014  . Smokeless tobacco: Never Used  . Alcohol use Yes     Comment: occasional    Review of Systems Constitutional: Negative for fever. Cardiovascular: Negative for chest pain. Respiratory: Negative for shortness of breath. Gastrointestinal: Negative for abdominal pain Skin: Generalized rash. Neurological: Negative for headache 10-point ROS otherwise negative.  ____________________________________________   PHYSICAL EXAM:  VITAL SIGNS: ED Triage Vitals [07/14/16 1337]  Enc Vitals Group     BP 123/78     Pulse Rate 96     Resp 16     Temp 97.9 F (36.6 C)     Temp Source Oral     SpO2 100 %     Weight 170 lb (77.1 kg)     Height 5\' 5"  (1.651 m)     Head Circumference      Peak Flow      Pain Score      Pain Loc      Pain Edu?      Excl. in GC?     Constitutional: Alert and oriented. Well appearing and in no distress. Eyes: Normal exam ENT   Head: Normocephalic and atraumatic   Mouth/Throat: Mucous membranes are moist. Gastrointestinal: Soft and nontender. No distention.   Musculoskeletal: Nontender with normal range of motion in all extremities.  Neurologic:  Normal speech and language. No gross focal neurologic deficits. Ambulates  without difficulty. Skin:  Patient has a generalized rash over her entire body however the rash appears to spare the face. Rash is macular papular, with several lesions that appear to have coalesced. No urticaria. Psychiatric: Mood and affect are normal.   ____________________________________________   INITIAL IMPRESSION / ASSESSMENT AND PLAN / ED COURSE  Pertinent labs & imaging results that were available during my care of the patient were reviewed by me and considered in my medical decision making (see chart for details).  Patient presents for a generalized rash. States it has been ongoing for over one month. Denies fever. Denies chest pain or trouble breathing. Denies genital lesions. Patient states they itch. The rash looks most consistent with either a severe scabies infestation versus  secondary syphilis. I reviewed the patient's did chart it appears that she was not tested or syphilis or HIV at that time. I have added those labs on to today's evaluation. However given the likelihood of scabies being a much more calm and illness and syphilis we will treat with permethrin while awaiting the RPR results. The patient does state she recently underwent a course of penicillin approximately 1 month ago as well.          ____________________________________________   FINAL CLINICAL IMPRESSION(S) / ED DIAGNOSES  Scabies Rash    Minna AntisKevin Ebonee Stober, MD 07/14/16 1422    Minna AntisKevin Sirr Kabel, MD 07/14/16 1428

## 2016-07-14 NOTE — ED Triage Notes (Signed)
Pt c/o itching rash for the past month, states she has a hx of scabies and this looks the same..Marland Kitchen

## 2016-07-14 NOTE — Discharge Instructions (Signed)
Return to the emergency department for any fever, or any other symptom [place] concerning to yourself. Otherwise please follow-up with dermatology by calling the number provided.

## 2016-07-15 LAB — RPR: RPR: NONREACTIVE

## 2016-08-03 ENCOUNTER — Encounter: Payer: Self-pay | Admitting: Medical Oncology

## 2016-08-03 ENCOUNTER — Emergency Department
Admission: EM | Admit: 2016-08-03 | Discharge: 2016-08-03 | Disposition: A | Discharge status: Home / Self Care | Payer: Medicaid Other | Attending: Emergency Medicine | Admitting: Emergency Medicine

## 2016-08-03 DIAGNOSIS — Z791 Long term (current) use of non-steroidal anti-inflammatories (NSAID): Secondary | ICD-10-CM | POA: Insufficient documentation

## 2016-08-03 DIAGNOSIS — B86 Scabies: Secondary | ICD-10-CM | POA: Insufficient documentation

## 2016-08-03 DIAGNOSIS — R21 Rash and other nonspecific skin eruption: Secondary | ICD-10-CM

## 2016-08-03 DIAGNOSIS — Z87891 Personal history of nicotine dependence: Secondary | ICD-10-CM | POA: Insufficient documentation

## 2016-08-03 DIAGNOSIS — Z79899 Other long term (current) drug therapy: Secondary | ICD-10-CM | POA: Insufficient documentation

## 2016-08-03 LAB — WET PREP, GENITAL
Clue Cells Wet Prep HPF POC: NONE SEEN
Sperm: NONE SEEN
Trich, Wet Prep: NONE SEEN
Yeast Wet Prep HPF POC: NONE SEEN

## 2016-08-03 LAB — CHLAMYDIA/NGC RT PCR (ARMC ONLY)
Chlamydia Tr: NOT DETECTED
N gonorrhoeae: NOT DETECTED

## 2016-08-03 MED ORDER — METHYLPREDNISOLONE SODIUM SUCC 125 MG IJ SOLR
125.0000 mg | Freq: Once | INTRAMUSCULAR | Status: AC
  Administered 2016-08-03: 125 mg via INTRAVENOUS
  Filled 2016-08-03: qty 2

## 2016-08-03 MED ORDER — DIPHENHYDRAMINE HCL 25 MG PO CAPS
50.0000 mg | ORAL_CAPSULE | Freq: Once | ORAL | Status: AC
  Administered 2016-08-03: 50 mg via ORAL
  Filled 2016-08-03: qty 2

## 2016-08-03 MED ORDER — IVERMECTIN 3 MG PO TABS: 200.0000 ug/kg | ORAL_TABLET | Freq: Once | ORAL | 1 refills | Status: AC

## 2016-08-03 NOTE — ED Provider Notes (Signed)
[facility] Emergency Department Provider Note  ____________________________________________  Time seen: Approximately 6:09 PM  I have reviewed the triage vital signs and the nursing notes.   HISTORY  Chief Complaint Rash   HPI Tina [name] is a 35 y.o. female presenting with diffuse pruritic, erythematous papules and linear burrows across all body surfaces except the face. She states this generalized rash has occurred for the past 2 months. Rash is refractory to steroids and triamcinolone cream. Rash is also refractory to permethrin cream. Patient denies fever and describes rash as extremely pruritic. At this emergency department visit, patient additionally requesst a pelvic exam to test for gonorrhea and chlamydia.  Past Medical History:  Diagnosis Date  . Abnormal Pap smear    2003 ASC-US 2005 ASC-US LSIL high risk HPV;LAST PAP A WHILE AGO  . Acid reflux   . 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 Depression    NO MEDS  . Gestational diabetes 2008   NO MEDS;DIET CONTROLLED  . Gestational diabetes mellitus 2012   NO MEDS; DIET CONTROLLED  . Gonorrhea   . H/O bacterial infection   . H/O migraine   . H/O varicella   . Headache(784.0)    Frequent  . History of bilateral tubal ligation 12/12/14  . Hx of chlamydia infection   . Postpartum depression   . 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 Status post cesarean delivery 04/23/2012  . Trichimoniasis     Patient Active Problem List   Diagnosis Date Noted  . Previous cesarean section complicating pregnancy 12/13/2014  . Diet controlled gestational diabetes mellitus in third trimester   . Drug dependence in pregnancy (HCC)   . 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 Uterine scar from previous cesarean delivery, antepartum   . Cigarette smoker   . Fetal arrhythmia affecting pregnancy, antepartum   . GDM (gestational diabetes mellitus)   . Rh  alloimmunization, maternal, antepartum   . 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 Tobacco use during pregnancy   . Abnormal quad screen 06/28/2014  . Gestational diabetes mellitus, antepartum 06/20/2014  . Incarcerated status of pregnant patient 06/19/2014  . Previous cesarean delivery X 6 affecting pregnancy, antepartum 06/19/2014  . Cannabis abuse 03/21/2011    Past Surgical History:  Procedure Laterality Date  . CESAREAN SECTION  2005   x4;FIRST CHILD IN FETAL DISTRESS  . CESAREAN SECTION  04/23/2012   Procedure: CESAREAN SECTION;  Surgeon: Purcell NailsAngela Y Roberts, MD;  Location: [facility];  Service: Obstetrics;  Laterality: N/A;  . CESAREAN SECTION N/A 05/03/2013   Procedure: REPEAT CESAREAN SECTION WITH POSS ABDOMINAL HYSTERECTOMY;  Surgeon: Purcell NailsAngela Y Roberts, MD;  Location: [facility];  Service: Obstetrics;  Laterality: N/A;  . CESAREAN SECTION N/A 12/12/2014   Procedure: CESAREAN SECTION REPEAT;  Surgeon: Willodean Rosenthalarolyn Harraway-Smith, MD;  Location: [facility];  Service: Obstetrics;  Laterality: N/A;  Tracie S confirmed FA  . TUBAL LIGATION      Prior to Admission medications   Medication Sig Start Date End Date Taking? Authorizing Provider  cyclobenzaprine (FLEXERIL) 10 MG tablet Take 1 tablet (10 mg total) by mouth 3 (three) times daily as needed for muscle spasms. 04/24/16   Ignacia Bayleyobert Tumey, PA-C  docusate sodium (COLACE) 100 MG capsule Take 1 capsule (100 mg total) by mouth 2 (two) times daily. Patient not taking: Reported on 01/17/2015 12/15/14   Fredirick LatheKristy Acosta, MD  ibuprofen (ADVIL,MOTRIN) 800 MG tablet Take 1 tablet (800  mg total) by mouth every 8 (eight) hours as needed. 04/24/16   Ignacia Bayleyobert Tumey, PA-C  ivermectin (STROMECTOL) 3 MG TABS tablet Take 5 tablets (15,000 mcg total) by mouth once. 08/03/16 08/03/16  Orvil FeilJaclyn M Addley Ballinger, PA-C  oxyCODONE-acetaminophen (ROXICET) 5-325 MG tablet Take 1 tablet by mouth every 6 (six) hours as needed for severe pain. 04/24/16    Ignacia Bayleyobert Tumey, PA-C  permethrin (ELIMITE) 5 % cream Apply to entire body except her face. Let sit for 8-10 hours and then shower off. 07/14/16 07/14/17  Minna AntisKevin Paduchowski, MD  polyethylene glycol [facility](MIRALAX / Ethelene HalGLYCOLAX) packet Take 17 g by mouth daily as needed for moderate constipation. 01/17/15   Willodean Rosenthalarolyn Harraway-Smith, MD    Allergies Penicillins  Family History  Problem Relation Age of Onset  . Hypertension Paternal Grandfather   . COPD Mother     Bronchitis  . Asthma Brother   . COPD Brother     Bronchitis  . Hypertension Paternal Grandmother   . Other Paternal Grandmother     SPIDER VEINS  . Other Paternal Aunt     SPIDER VEINS  . Seizures Cousin     PATERNAL  . Anesthesia problems Neg Hx   . Hypotension Neg Hx   . Malignant hyperthermia Neg Hx   . Pseudochol deficiency Neg Hx     Social History Social History  Substance Use Topics  . Smoking status: Former Smoker    Packs/day: 0.25    Types: Cigarettes    Quit date: 05/24/2014  . Smokeless tobacco: Never Used  . Alcohol use Yes     Comment: occasional    Review of Systems Constitutional: Negative for fever/chills Respiratory: Negative for shortness of breath. Musculoskeletal: Negative for pain. Skin: Patient has pruritic erythematous papules and linear burrows across her body except for her face. Neurological: Negative for headaches, focal weakness or numbness. ____________________________________________   PHYSICAL EXAM:  VITAL SIGNS: ED Triage Vitals  Enc Vitals Group     BP 08/03/16 1251 138/84     Pulse Rate 08/03/16 1251 84     Resp 08/03/16 1251 18     Temp 08/03/16 1251 98 F (36.7 C)     Temp Source 08/03/16 1251 Oral     SpO2 08/03/16 1251 97 %     Weight 08/03/16 1247 170 lb (77.1 kg)     Height 08/03/16 1247 5\' 5"  (1.651 m)     Head Circumference --      Peak Flow --      Pain Score 08/03/16 1247 0     Pain Loc --      Pain Edu? --      Excl. in GC? --      Constitutional: Alert and  oriented. Well appearing and in no acute distress. Eyes: Conjunctivae are normal. EOMI. Nose: No congestion/rhinnorhea. Lymphatic: No palpable cervical lymphadenopathy. Cardiovascular: Good peripheral circulation. Respiratory: Normal respiratory effort.  No retractions. Lungs CTAB.  Genital: Patient has excoriations of the labia major and minor consistent with pruritic scabies. No cervical motion tenderness was elicited during exam. Neurologic:  Normal speech and language. No gross focal neurologic deficits are appreciated. Skin:  Patient has pruritic erythematous coalescing papules and linear burrows across her body except for her face.  ____________________________________________   LABS (all labs ordered are listed, but only abnormal results are displayed)  Labs Reviewed  WET PREP, GENITAL - Abnormal; Notable for the following:       Result Value   WBC, Wet Prep HPF POC  FEW (*)    All other components within normal limits  CHLAMYDIA/NGC RT PCR ([facility] ONLY)  POC URINE PREG, ED     PROCEDURES Solumedrol Benadryl    INITIAL IMPRESSION / ASSESSMENT AND PLAN / ED COURSE  Clinical Course    Pertinent labs & imaging results that were available during my care of the patient were reviewed by me and considered in my medical decision making (see chart for details).  Assessment and plan: Patient was seen on 07/14/2016 by Dr. Lenard Lance. I agree presentation of rash appears consistent with a severe systemic scabies infection. I feel that ivermectin is a suitable pharmacologic regimen at this stage of scabies severity. An injection of Solu-Medrol and Benadryl were provided in the emergency department today. Patient additionally underwent a pelvic exam and gonorrhea and chlamydia was tested for (at her request). Trichomoniasis, yeast, chlamydia and gonorrhea were not found. She was advised to follow-up in one week with her primary care provider if symptoms do not  improve.   ____________________________________________   FINAL CLINICAL IMPRESSION(S) / ED DIAGNOSES  Final diagnoses:  Scabies  Rash    Discharge Medication List as of 08/03/2016  2:57 PM    START taking these medications   Details  ivermectin (STROMECTOL) 3 MG TABS tablet Take 5 tablets (15,000 mcg total) by mouth once., Starting Sun 08/03/2016, Print        Note:  This document was prepared using Dragon voice recognition software and may include unintentional dictation errors.    Orvil Feil, PA-C 08/03/16 1826    Sharyn Creamer, MD 08/10/16 [phone]

## 2016-08-03 NOTE — ED Triage Notes (Signed)
Pt reports itchy all over for over a month.

## 2016-08-03 NOTE — ED Notes (Signed)
Pt verbalized understanding of discharge instructions. NAD at this time. 

## 2016-08-23 ENCOUNTER — Encounter: Payer: Self-pay | Admitting: Emergency Medicine

## 2016-08-23 ENCOUNTER — Emergency Department
Admission: EM | Admit: 2016-08-23 | Discharge: 2016-08-23 | Disposition: A | Discharge status: Home / Self Care | Payer: Medicaid Other | Attending: Emergency Medicine | Admitting: Emergency Medicine

## 2016-08-23 DIAGNOSIS — R21 Rash and other nonspecific skin eruption: Secondary | ICD-10-CM

## 2016-08-23 DIAGNOSIS — Z87891 Personal history of nicotine dependence: Secondary | ICD-10-CM | POA: Insufficient documentation

## 2016-08-23 LAB — RAPID HIV SCREEN (HIV 1/2 AB+AG)
HIV 1/2 Antibodies: NONREACTIVE
HIV-1 P24 ANTIGEN - HIV24: NONREACTIVE

## 2016-08-23 MED ORDER — PREDNISONE 10 MG PO TABS: 60.0000 mg | ORAL_TABLET | Freq: Every day | ORAL | 0 refills | Status: DC

## 2016-08-23 MED ORDER — HYDROXYZINE HCL 50 MG PO TABS
50.0000 mg | ORAL_TABLET | Freq: Once | ORAL | Status: AC
  Administered 2016-08-23: 50 mg via ORAL
  Filled 2016-08-23: qty 1

## 2016-08-23 MED ORDER — PREDNISONE 20 MG PO TABS
60.0000 mg | ORAL_TABLET | Freq: Once | ORAL | Status: AC
  Administered 2016-08-23: 60 mg via ORAL
  Filled 2016-08-23: qty 3

## 2016-08-23 MED ORDER — HYDROXYZINE HCL 25 MG PO TABS: ORAL_TABLET | ORAL | 0 refills | Status: DC

## 2016-08-23 MED ORDER — DOXYCYCLINE HYCLATE 100 MG PO TABS
100.0000 mg | ORAL_TABLET | Freq: Once | ORAL | Status: AC
  Administered 2016-08-23: 100 mg via ORAL
  Filled 2016-08-23: qty 1

## 2016-08-23 MED ORDER — DOXYCYCLINE HYCLATE 100 MG PO CAPS: 100.0000 mg | ORAL_CAPSULE | Freq: Two times a day (BID) | ORAL | 0 refills | Status: DC

## 2016-08-23 NOTE — ED Provider Notes (Signed)
[facility] Emergency Department Provider Note  ____________________________________________  Time seen: Approximately 8:48 PM  I have reviewed the triage vital signs and the nursing notes.   HISTORY  Chief Complaint Rash   HPI Azucena FreedKeoshea Q Sliwa is a 35 y.o. female who presents to the emergency departmentfor evaluation of a rash that has been present since before 06/27/2016. She has been evaluated multiple times for this rash. She states that the rash started on her right forearm and has since spread across her body including her hands and feet, but excludes her face. She states that over the past couple of days she has noticed a couple of red spots on her left cheek. Since her last visit here, she states that she hasn't worsened or improved. She doesn't feel that any of the treatments that have been attempted to have been successful.  Past Medical History:  Diagnosis Date  . Abnormal Pap smear    2003 ASC-US 2005 ASC-US LSIL high risk HPV;LAST PAP A WHILE AGO  . Acid reflux   . 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 Depression    NO MEDS  . Gestational diabetes 2008   NO MEDS;DIET CONTROLLED  . Gestational diabetes mellitus 2012   NO MEDS; DIET CONTROLLED  . Gonorrhea   . H/O bacterial infection   . H/O migraine   . H/O varicella   . Headache(784.0)    Frequent  . History of bilateral tubal ligation 12/12/14  . Hx of chlamydia infection   . Postpartum depression   . 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 Status post cesarean delivery 04/23/2012  . Trichimoniasis     Patient Active Problem List   Diagnosis Date Noted  . Previous cesarean section complicating pregnancy 12/13/2014  . Diet controlled gestational diabetes mellitus in third trimester   . Drug dependence in pregnancy (HCC)   . 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 Uterine scar from previous cesarean delivery, antepartum   .  Cigarette smoker   . Fetal arrhythmia affecting pregnancy, antepartum   . GDM (gestational diabetes mellitus)   . Rh alloimmunization, maternal, antepartum   . 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 Tobacco use during pregnancy   . Abnormal quad screen 06/28/2014  . Gestational diabetes mellitus, antepartum 06/20/2014  . Incarcerated status of pregnant patient 06/19/2014  . Previous cesarean delivery X 6 affecting pregnancy, antepartum 06/19/2014  . Cannabis abuse 03/21/2011    Past Surgical History:  Procedure Laterality Date  . CESAREAN SECTION  2005   x4;FIRST CHILD IN FETAL DISTRESS  . CESAREAN SECTION  04/23/2012   Procedure: CESAREAN SECTION;  Surgeon: Purcell NailsAngela Y Roberts, MD;  Location: [facility];  Service: Obstetrics;  Laterality: N/A;  . CESAREAN SECTION N/A 05/03/2013   Procedure: REPEAT CESAREAN SECTION WITH POSS ABDOMINAL HYSTERECTOMY;  Surgeon: Purcell NailsAngela Y Roberts, MD;  Location: [facility];  Service: Obstetrics;  Laterality: N/A;  . CESAREAN SECTION N/A 12/12/2014   Procedure: CESAREAN SECTION REPEAT;  Surgeon: Willodean Rosenthalarolyn Harraway-Smith, MD;  Location: [facility];  Service: Obstetrics;  Laterality: N/A;  Tracie S confirmed FA  . TUBAL LIGATION      Prior to Admission medications   Medication Sig Start Date End Date Taking? Authorizing Provider  cyclobenzaprine (FLEXERIL) 10 MG tablet Take 1 tablet (10 mg total) by mouth 3 (three) times daily as needed for muscle spasms. 04/24/16   Ignacia Bayleyobert Tumey, PA-C  docusate sodium (COLACE) 100 MG capsule Take  1 capsule (100 mg total) by mouth 2 (two) times daily. Patient not taking: Reported on 01/17/2015 12/15/14   Fredirick LatheKristy Acosta, MD  doxycycline (VIBRAMYCIN) 100 MG capsule Take 1 capsule (100 mg total) by mouth 2 (two) times daily. 08/23/16   Chinita Pesterari B Sonia Stickels, FNP  hydrOXYzine (ATARAX/VISTARIL) 25 MG tablet 1-2 tablets every 6 hours as needed for itching. 08/23/16   Chinita Pesterari B Abigayl Hor, FNP  ibuprofen  (ADVIL,MOTRIN) 800 MG tablet Take 1 tablet (800 mg total) by mouth every 8 (eight) hours as needed. 04/24/16   Ignacia Bayleyobert Tumey, PA-C  oxyCODONE-acetaminophen (ROXICET) 5-325 MG tablet Take 1 tablet by mouth every 6 (six) hours as needed for severe pain. 04/24/16   Ignacia Bayleyobert Tumey, PA-C  permethrin (ELIMITE) 5 % cream Apply to entire body except her face. Let sit for 8-10 hours and then shower off. 07/14/16 07/14/17  Minna AntisKevin Paduchowski, MD  polyethylene glycol [facility](MIRALAX / Ethelene HalGLYCOLAX) packet Take 17 g by mouth daily as needed for moderate constipation. 01/17/15   Willodean Rosenthalarolyn Harraway-Smith, MD  predniSONE (DELTASONE) 10 MG tablet Take 6 tablets (60 mg total) by mouth daily. 6 tablets x 2 days, then decrease by 1 tablet every other day until finished 08/23/16   Chinita Pesterari B Clydette Privitera, FNP    Allergies Penicillins  Family History  Problem Relation Age of Onset  . Hypertension Paternal Grandfather   . COPD Mother     Bronchitis  . Asthma Brother   . COPD Brother     Bronchitis  . Hypertension Paternal Grandmother   . Other Paternal Grandmother     SPIDER VEINS  . Other Paternal Aunt     SPIDER VEINS  . Seizures Cousin     PATERNAL  . Anesthesia problems Neg Hx   . Hypotension Neg Hx   . Malignant hyperthermia Neg Hx   . Pseudochol deficiency Neg Hx     Social History Social History  Substance Use Topics  . Smoking status: Former Smoker    Packs/day: 0.25    Types: Cigarettes    Quit date: 05/24/2014  . Smokeless tobacco: Never Used  . Alcohol use Yes     Comment: occasional    Review of Systems  Constitutional: Negative for fever/chills Respiratory: Negative for shortness of breath. Musculoskeletal: Negative for pain. Skin: Positive for pruritic rash Neurological: Negative for headaches, focal weakness or numbness. ____________________________________________   PHYSICAL EXAM:  VITAL SIGNS: ED Triage Vitals  Enc Vitals Group     BP 08/23/16 1843 (!) 130/107     Pulse --      Resp  08/23/16 1843 16     Temp 08/23/16 1843 98.2 F (36.8 C)     Temp Source 08/23/16 1843 Oral     SpO2 08/23/16 1843 97 %     Weight 08/23/16 1842 166 lb 4.8 oz (75.4 kg)     Height --      Head Circumference --      Peak Flow --      Pain Score 08/23/16 1841 9     Pain Loc --      Pain Edu? --      Excl. in GC? --      Constitutional: Alert and oriented. Well appearing and in no acute distress. Eyes: Conjunctivae are normal. EOMI. Nose: No congestion/rhinnorhea. Mouth/Throat: Mucous membranes are moist.   Neck: No stridor. Lymphatic: No cervical lymphadenopathy. No nodes palpable in the groin area. Cardiovascular: Good peripheral circulation. Respiratory: Normal respiratory effort.  No retractions. Lungs clear to  auscultation. Musculoskeletal: FROM throughout. Neurologic:  Normal speech and language. No gross focal neurologic deficits are appreciated. Skin:  Scaling, annular and sometimes coalescent maculopapular rash widespread over surface of body with the exception of face. No new, uninterrupted lesions identified for better for clarification.  ____________________________________________   LABS (all labs ordered are listed, but only abnormal results are displayed)  Labs Reviewed  RAPID HIV SCREEN (HIV 1/2 AB+AG)  RPR   ____________________________________________  EKG Not indicated   ____________________________________________  RADIOLOGY   ____________________________________________   PROCEDURES  Procedure(s) performed: None ____________________________________________   INITIAL IMPRESSION / ASSESSMENT AND PLAN / ED COURSE  Clinical Course     Pertinent labs & imaging results that were available during my care of the patient were reviewed by me and considered in my medical decision making (see chart for details).  35 year old female presenting to the emergency department for a rash that started in early November. She states that the only thing that  has really helped was a "shot of some kind" that she received at one of the previous visits. She has been seen here as well as [facility] and advised to follow-up with dermatology. She states that she does not have the co-pay to see the dermatologist and chooses to continue returning here to the emergency department. She was advised that the emergency Department staff does not specialize in dermatology and at this point she will need to see a specialist if she intends to learn the underlying cause of her symptoms. A RPR was drawn as these symptoms could be secondary syphilis. She is penicillin allergic and therefore will be given doxycycline empirically. She was also treated with a long steroid taper and given Vistaril for the burning and itching. She mentioned to the ER and that she also wanted STD testing however she tested negative at her last visit and therefore I see no need to repeat STD screening tonight. She was given information about the health department and the free STD clinic and encouraged to follow up there for her future STD concerns. Patient was discharged home and advised that the only reason that she would needed to return to the ER for these symptoms are if something changed or worsened. ____________________________________________   FINAL CLINICAL IMPRESSION(S) / ED DIAGNOSES  Final diagnoses:  Rash and nonspecific skin eruption    Discharge Medication List as of 08/23/2016  9:46 PM    START taking these medications   Details  doxycycline (VIBRAMYCIN) 100 MG capsule Take 1 capsule (100 mg total) by mouth 2 (two) times daily., Starting Sat 08/23/2016, Print    hydrOXYzine (ATARAX/VISTARIL) 25 MG tablet 1-2 tablets every 6 hours as needed for itching., Print    predniSONE (DELTASONE) 10 MG tablet Take 6 tablets (60 mg total) by mouth daily. 6 tablets x 2 days, then decrease by 1 tablet every other day until finished, Starting Sat 08/23/2016, Print        Note:  This document was  prepared using Dragon voice recognition software and may include unintentional dictation errors.    Chinita Pester, FNP 08/23/16 1610    Jennye Moccasin, MD 08/23/16 [phone]

## 2016-08-23 NOTE — Discharge Instructions (Signed)
Take the antibiotic until finished as well as the prednisone taper.  It is very important that he follow-up with dermatology. Please call your primary care provider for a referral to dermatology if necessary.  If her symptoms change or worsen, and you cannot schedule an appointment with dermatology or primary care, return to the emergency department.

## 2016-08-23 NOTE — ED Triage Notes (Signed)
Patient states that she has had a persistent rash for over 1 month. Patient states [facility] and told that she was having an allergic reaction, patient was then seen in our ED twice and treated for scabies. Patient states that the rash did not get better with the cream or pills that she was treated with.  Patient states that the rash itches and burn. Patient has tried benadryl without relief.

## 2016-08-23 NOTE — ED Notes (Signed)
Patient has diffuse pruritic, erythematous papules and linear burrows across all body surfaces except the face. She states this generalized rash has occurred for the past 2 months. She has been prescribed steroids and triamcinolone cream but it has not worked. Permethrin cream has been used without any benefit.  Patient denies fever and describes rash as extremely pruritic. Patient received a  pelvic exam during her last visit to test for gonorrhea and chlamydia.  She is saying she has "some discharge" and is requesting another PE.  I asked her if she was referred to a dermatologist and she said yes but she cannot afford the $100 for the appointment and states she will just keep coming back here.

## 2016-08-25 LAB — RPR: RPR: NONREACTIVE

## 2017-04-07 ENCOUNTER — Emergency Department
Admission: EM | Admit: 2017-04-07 | Discharge: 2017-04-07 | Disposition: A | Discharge status: Home / Self Care | Payer: Medicaid Other | Attending: Emergency Medicine | Admitting: Emergency Medicine

## 2017-04-07 ENCOUNTER — Encounter: Payer: Self-pay | Admitting: Emergency Medicine

## 2017-04-07 DIAGNOSIS — R197 Diarrhea, unspecified: Secondary | ICD-10-CM | POA: Insufficient documentation

## 2017-04-07 DIAGNOSIS — R112 Nausea with vomiting, unspecified: Secondary | ICD-10-CM | POA: Insufficient documentation

## 2017-04-07 DIAGNOSIS — Z79899 Other long term (current) drug therapy: Secondary | ICD-10-CM | POA: Insufficient documentation

## 2017-04-07 DIAGNOSIS — F17210 Nicotine dependence, cigarettes, uncomplicated: Secondary | ICD-10-CM | POA: Insufficient documentation

## 2017-04-07 MED ORDER — ONDANSETRON 4 MG PO TBDP: 4.0000 mg | ORAL_TABLET | Freq: Three times a day (TID) | ORAL | 0 refills | Status: DC | PRN

## 2017-04-07 MED ORDER — ONDANSETRON 4 MG PO TBDP
ORAL_TABLET | ORAL | Status: AC
  Administered 2017-04-07: 4 mg
  Filled 2017-04-07: qty 1

## 2017-04-07 MED ORDER — LOPERAMIDE HCL 2 MG PO CAPS
ORAL_CAPSULE | ORAL | Status: AC
  Administered 2017-04-07: 2 mg
  Filled 2017-04-07: qty 1

## 2017-04-07 NOTE — ED Notes (Signed)
Patient did not want this rn to start an IV at this time. Wanting to wait. Patient asked, "will I be out by 7am?" this Rn explained more than likely no she will be here a couple of hours. Patient stated, "i Have somewhere important to be.Marland Kitchen.Marland Kitchen."

## 2017-04-07 NOTE — ED Provider Notes (Signed)
[facility] Emergency Department Provider Note    First MD Initiated Contact with Patient 04/07/17 [phone]     (approximate)  I have reviewed the triage vital signs and the nursing notes.   HISTORY  Chief Complaint Emesis and Diarrhea    HPI Tina [name] is a 36 y.o. female with below list of medical conditions presents to the emergency department with acute onset of nausea vomiting diarrhea with onset this morning. Patient states she's had multiple episodes of nonbloody emesis and diarrhea. Patient denies any abdominal pain. Patient denies any fever afebrile on presentation. Patient states multiple sick contacts at home with the same. Patient requesting to leave the emergency department as she states that she has to be home before 7 AM   Past Medical History:  Diagnosis Date  . Abnormal Pap smear    2003 ASC-US 2005 ASC-US LSIL high risk HPV;LAST PAP A WHILE AGO  . Acid reflux   . 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 Depression    NO MEDS  . Gestational diabetes 2008   NO MEDS;DIET CONTROLLED  . Gestational diabetes mellitus 2012   NO MEDS; DIET CONTROLLED  . Gonorrhea   . H/O bacterial infection   . H/O migraine   . H/O varicella   . Headache(784.0)    Frequent  . History of bilateral tubal ligation 12/12/14  . Hx of chlamydia infection   . Postpartum depression   . 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 Status post cesarean delivery 04/23/2012  . Trichimoniasis     Patient Active Problem List   Diagnosis Date Noted  . Previous cesarean section complicating pregnancy 12/13/2014  . Diet controlled gestational diabetes mellitus in third trimester   . Drug dependence in pregnancy (HCC)   . 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 Uterine scar from previous cesarean delivery, antepartum   . Cigarette smoker   . Fetal arrhythmia affecting pregnancy, antepartum   . GDM (gestational  diabetes mellitus)   . Rh alloimmunization, maternal, antepartum   . 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 Tobacco use during pregnancy   . Abnormal quad screen 06/28/2014  . Gestational diabetes mellitus, antepartum 06/20/2014  . Incarcerated status of pregnant patient 06/19/2014  . Previous cesarean delivery X 6 affecting pregnancy, antepartum 06/19/2014  . Cannabis abuse 03/21/2011    Past Surgical History:  Procedure Laterality Date  . CESAREAN SECTION  2005   x4;FIRST CHILD IN FETAL DISTRESS  . CESAREAN SECTION  04/23/2012   Procedure: CESAREAN SECTION;  Surgeon: Purcell NailsAngela Y Roberts, MD;  Location: [facility];  Service: Obstetrics;  Laterality: N/A;  . CESAREAN SECTION N/A 05/03/2013   Procedure: REPEAT CESAREAN SECTION WITH POSS ABDOMINAL HYSTERECTOMY;  Surgeon: Purcell NailsAngela Y Roberts, MD;  Location: [facility];  Service: Obstetrics;  Laterality: N/A;  . CESAREAN SECTION N/A 12/12/2014   Procedure: CESAREAN SECTION REPEAT;  Surgeon: Willodean Rosenthalarolyn Harraway-Smith, MD;  Location: [facility];  Service: Obstetrics;  Laterality: N/A;  Tracie S confirmed FA  . TUBAL LIGATION      Prior to Admission medications   Medication Sig Start Date End Date Taking? Authorizing Provider  cyclobenzaprine (FLEXERIL) 10 MG tablet Take 1 tablet (10 mg total) by mouth 3 (three) times daily as needed for muscle spasms. 04/24/16   Ignacia Bayleyumey, Robert, PA-C  docusate sodium (COLACE) 100 MG capsule Take 1 capsule (100 mg total) by mouth 2 (two) times daily. Patient not taking: Reported  on 01/17/2015 12/15/14   Fredirick Lathe, MD  doxycycline (VIBRAMYCIN) 100 MG capsule Take 1 capsule (100 mg total) by mouth 2 (two) times daily. 08/23/16   Triplett, Rulon Eisenmenger B, FNP  hydrOXYzine (ATARAX/VISTARIL) 25 MG tablet 1-2 tablets every 6 hours as needed for itching. 08/23/16   Triplett, Rulon Eisenmenger B, FNP  ibuprofen (ADVIL,MOTRIN) 800 MG tablet Take 1 tablet (800 mg total) by mouth every 8 (eight) hours as needed.  04/24/16   Ignacia Bayley, PA-C  oxyCODONE-acetaminophen (ROXICET) 5-325 MG tablet Take 1 tablet by mouth every 6 (six) hours as needed for severe pain. 04/24/16   Ignacia Bayley, PA-C  permethrin (ELIMITE) 5 % cream Apply to entire body except her face. Let sit for 8-10 hours and then shower off. 07/14/16 07/14/17  Minna Antis, MD  polyethylene glycol (MIRALAX / Ethelene Hal) packet Take 17 g by mouth daily as needed for moderate constipation. 01/17/15   Willodean Rosenthal, MD  predniSONE (DELTASONE) 10 MG tablet Take 6 tablets (60 mg total) by mouth daily. 6 tablets x 2 days, then decrease by 1 tablet every other day until finished 08/23/16   Triplett, Cari B, FNP    Allergies Penicillins  Family History  Problem Relation Age of Onset  . Hypertension Paternal Grandfather   . COPD Mother        Bronchitis  . Asthma Brother   . COPD Brother        Bronchitis  . Hypertension Paternal Grandmother   . Other Paternal Grandmother        SPIDER VEINS  . Other Paternal Aunt        SPIDER VEINS  . Seizures Cousin        PATERNAL  . Anesthesia problems Neg Hx   . Hypotension Neg Hx   . Malignant hyperthermia Neg Hx   . Pseudochol deficiency Neg Hx     Social History Social History  Substance Use Topics  . Smoking status: Light Tobacco Smoker    Packs/day: 0.25    Types: Cigarettes    Last attempt to quit: 05/24/2014  . Smokeless tobacco: Never Used  . Alcohol use Yes     Comment: occasional    Review of Systems Constitutional: No fever/chills Eyes: No visual changes. ENT: No sore throat. Cardiovascular: Denies chest pain. Respiratory: Denies shortness of breath. Gastrointestinal: No abdominal pain.  Positive for vomiting and diarrhea  Genitourinary: Negative for dysuria. Musculoskeletal: Negative for neck pain.  Negative for back pain. Integumentary: Negative for rash. Neurological: Negative for headaches, focal weakness or  numbness.   ____________________________________________   PHYSICAL EXAM:  VITAL SIGNS: ED Triage Vitals [04/07/17 0534]  Enc Vitals Group     BP (!) 169/103     Pulse Rate (!) 118     Resp 18     Temp 98.4 F (36.9 C)     Temp Source Oral     SpO2 98 %     Weight 74.8 kg (165 lb)     Height 1.626 m (5\' 4" )     Head Circumference      Peak Flow      Pain Score 0     Pain Loc      Pain Edu?      Excl. in GC?     Constitutional: Alert and oriented. Well appearing and in no acute distress. Eyes: Conjunctivae are normal.  Head: Atraumatic. Mouth/Throat: Mucous membranes are moist. Oropharynx non-erythematous. Neck: No stridor.  Cardiovascular: Normal rate, regular rhythm. Good peripheral circulation.  Grossly normal heart sounds. Respiratory: Normal respiratory effort.  No retractions. Lungs CTAB. Gastrointestinal: Soft and nontender. No distention.  Musculoskeletal: No lower extremity tenderness nor edema. No gross deformities of extremities. Neurologic:  Normal speech and language. No gross focal neurologic deficits are appreciated.  Skin:  Skin is warm, dry and intact. No rash noted.   ____________________________________________   LABS (all labs ordered are listed, but only abnormal results are displayed)  Labs Reviewed  LIPASE, BLOOD  COMPREHENSIVE METABOLIC PANEL  CBC  URINALYSIS, COMPLETE (UACMP) WITH MICROSCOPIC     Procedures   ____________________________________________   INITIAL IMPRESSION / ASSESSMENT AND PLAN / ED COURSE  Pertinent labs & imaging results that were available during my care of the patient were reviewed by me and considered in my medical decision making (see chart for details).  Patient refused any laboratory data or any further evaluation to be performed. Patient given by mouth Zofran and Imodium. Advised to return to emergency Department if any worsening of symptoms or abdominal pain.       ____________________________________________  FINAL CLINICAL IMPRESSION(S) / ED DIAGNOSES  Final diagnoses:  Nausea vomiting and diarrhea     MEDICATIONS GIVEN DURING THIS VISIT:  Medications - No data to display   NEW OUTPATIENT MEDICATIONS STARTED DURING THIS VISIT:  New Prescriptions   No medications on file    Modified Medications   No medications on file    Discontinued Medications   No medications on file     Note:  This document was prepared using Dragon voice recognition software and may include unintentional dictation errors.    Darci Current, MD 04/07/17 [phone]

## 2017-04-07 NOTE — ED Triage Notes (Signed)
Pt arrived to the ED for complaints of nausea, vomiting and diarrhea. Pt reports that everyone at home has been sick with the "stomach bug" and now she thinks that she has it. Pt is Aox4 in no apparent distress.

## 2017-06-21 ENCOUNTER — Emergency Department
Admission: EM | Admit: 2017-06-21 | Discharge: 2017-06-21 | Disposition: A | Discharge status: Home / Self Care | Payer: Self-pay | Attending: Emergency Medicine | Admitting: Emergency Medicine

## 2017-06-21 DIAGNOSIS — F17210 Nicotine dependence, cigarettes, uncomplicated: Secondary | ICD-10-CM | POA: Insufficient documentation

## 2017-06-21 DIAGNOSIS — N760 Acute vaginitis: Secondary | ICD-10-CM | POA: Insufficient documentation

## 2017-06-21 DIAGNOSIS — N898 Other specified noninflammatory disorders of vagina: Secondary | ICD-10-CM

## 2017-06-21 DIAGNOSIS — B9689 Other specified bacterial agents as the cause of diseases classified elsewhere: Secondary | ICD-10-CM

## 2017-06-21 DIAGNOSIS — Z79899 Other long term (current) drug therapy: Secondary | ICD-10-CM | POA: Insufficient documentation

## 2017-06-21 DIAGNOSIS — Z202 Contact with and (suspected) exposure to infections with a predominantly sexual mode of transmission: Secondary | ICD-10-CM

## 2017-06-21 LAB — CHLAMYDIA/NGC RT PCR (ARMC ONLY)
Chlamydia Tr: NOT DETECTED
N gonorrhoeae: NOT DETECTED

## 2017-06-21 LAB — URINALYSIS, COMPLETE (UACMP) WITH MICROSCOPIC
BACTERIA UA: NONE SEEN
BILIRUBIN URINE: NEGATIVE
Glucose, UA: NEGATIVE mg/dL
HGB URINE DIPSTICK: NEGATIVE
KETONES UR: 5 mg/dL — AB
LEUKOCYTES UA: NEGATIVE
NITRITE: NEGATIVE
PROTEIN: 30 mg/dL — AB
Specific Gravity, Urine: 1.03 (ref 1.005–1.030)
pH: 5 (ref 5.0–8.0)

## 2017-06-21 LAB — WET PREP, GENITAL
SPERM: NONE SEEN
Trich, Wet Prep: NONE SEEN
Yeast Wet Prep HPF POC: NONE SEEN

## 2017-06-21 MED ORDER — METRONIDAZOLE 500 MG PO TABS: 500.0000 mg | ORAL_TABLET | Freq: Two times a day (BID) | ORAL | 0 refills | Status: AC

## 2017-06-21 MED ORDER — AZITHROMYCIN 500 MG PO TABS
1000.0000 mg | ORAL_TABLET | Freq: Once | ORAL | Status: AC
  Administered 2017-06-21: 1000 mg via ORAL
  Filled 2017-06-21: qty 2

## 2017-06-21 MED ORDER — LIDOCAINE HCL (PF) 1 % IJ SOLN
INTRAMUSCULAR | Status: AC
  Administered 2017-06-21: 0.9 mL
  Filled 2017-06-21: qty 5

## 2017-06-21 MED ORDER — CEFTRIAXONE SODIUM 250 MG IJ SOLR
250.0000 mg | Freq: Once | INTRAMUSCULAR | Status: AC
  Administered 2017-06-21: 250 mg via INTRAMUSCULAR
  Filled 2017-06-21: qty 250
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
## 2017-06-21 NOTE — ED Notes (Signed)
Pt denies taking any meds

## 2017-06-21 NOTE — Discharge Instructions (Signed)
Take medication as prescribed. Return to emergency department if symptoms worsen and follow-up with PCP as needed.   °

## 2017-06-21 NOTE — ED Notes (Signed)
This pt is here with another pt being seen in the room next door; both pt's are gone from both rooms at this time

## 2017-06-21 NOTE — ED Triage Notes (Signed)
Patient reports having vaginal discharge and wants to be checked for STD.
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
## 2020-01-16 ENCOUNTER — Other Ambulatory Visit: Payer: Self-pay

## 2020-01-16 DIAGNOSIS — S51812A Laceration without foreign body of left forearm, initial encounter: Secondary | ICD-10-CM | POA: Insufficient documentation

## 2020-01-16 DIAGNOSIS — W269XXA Contact with unspecified sharp object(s), initial encounter: Secondary | ICD-10-CM | POA: Insufficient documentation

## 2020-01-16 DIAGNOSIS — Y9389 Activity, other specified: Secondary | ICD-10-CM | POA: Insufficient documentation

## 2020-01-16 DIAGNOSIS — Y999 Unspecified external cause status: Secondary | ICD-10-CM | POA: Insufficient documentation

## 2020-01-16 DIAGNOSIS — Z5321 Procedure and treatment not carried out due to patient leaving prior to being seen by health care provider: Secondary | ICD-10-CM | POA: Insufficient documentation

## 2020-01-16 DIAGNOSIS — Y929 Unspecified place or not applicable: Secondary | ICD-10-CM | POA: Insufficient documentation

## 2020-01-16 MED ORDER — OXYCODONE-ACETAMINOPHEN 5-325 MG PO TABS
1.0000 | ORAL_TABLET | ORAL | Status: DC | PRN
  Administered 2020-01-16: 1 via ORAL
  Filled 2020-01-16: qty 1
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
## 2020-01-17 ENCOUNTER — Emergency Department
Admission: EM | Admit: 2020-01-17 | Discharge: 2020-01-17 | Disposition: A | Discharge status: Home / Self Care | Payer: Self-pay | Attending: Emergency Medicine | Admitting: Emergency Medicine

## 2020-01-17 ENCOUNTER — Emergency Department
Admission: EM | Admit: 2020-01-17 | Discharge: 2020-01-17 | Disposition: A | Discharge status: Home / Self Care | Payer: Medicaid Other | Attending: Emergency Medicine | Admitting: Emergency Medicine

## 2020-01-17 ENCOUNTER — Other Ambulatory Visit: Payer: Self-pay

## 2020-01-17 ENCOUNTER — Encounter: Payer: Self-pay | Admitting: Emergency Medicine

## 2020-01-17 DIAGNOSIS — Z23 Encounter for immunization: Secondary | ICD-10-CM | POA: Insufficient documentation

## 2020-01-17 DIAGNOSIS — S41112A Laceration without foreign body of left upper arm, initial encounter: Secondary | ICD-10-CM

## 2020-01-17 DIAGNOSIS — S51812A Laceration without foreign body of left forearm, initial encounter: Secondary | ICD-10-CM | POA: Insufficient documentation

## 2020-01-17 DIAGNOSIS — F17210 Nicotine dependence, cigarettes, uncomplicated: Secondary | ICD-10-CM | POA: Insufficient documentation

## 2020-01-17 DIAGNOSIS — Y9389 Activity, other specified: Secondary | ICD-10-CM | POA: Insufficient documentation

## 2020-01-17 DIAGNOSIS — Y999 Unspecified external cause status: Secondary | ICD-10-CM | POA: Insufficient documentation

## 2020-01-17 DIAGNOSIS — Y929 Unspecified place or not applicable: Secondary | ICD-10-CM | POA: Insufficient documentation

## 2020-01-17 MED ORDER — CLINDAMYCIN PHOSPHATE 600 MG/4ML IJ SOLN
600.0000 mg | Freq: Once | INTRAMUSCULAR | Status: AC
Start: 2020-01-17 — End: 2020-01-17
  Administered 2020-01-17: 600 mg via INTRAMUSCULAR
  Filled 2020-01-17: qty 4

## 2020-01-17 MED ORDER — KETOROLAC TROMETHAMINE 30 MG/ML IJ SOLN
30.0000 mg | Freq: Once | INTRAMUSCULAR | Status: AC
  Administered 2020-01-17: 30 mg via INTRAMUSCULAR
  Filled 2020-01-17: qty 1

## 2020-01-17 MED ORDER — TETANUS-DIPHTH-ACELL PERTUSSIS 5-2.5-18.5 LF-MCG/0.5 IM SUSP
0.5000 mL | Freq: Once | INTRAMUSCULAR | Status: AC
  Administered 2020-01-17: 0.5 mL via INTRAMUSCULAR
  Filled 2020-01-17: qty 0.5

## 2020-01-17 MED ORDER — CLINDAMYCIN PHOSPHATE 600 MG/4ML IJ SOLN: 600.0000 mg | Freq: Once | INTRAMUSCULAR | Status: DC

## 2020-01-17 MED ORDER — CLINDAMYCIN PHOSPHATE 600 MG/50ML IV SOLN: 600.0000 mg | Freq: Once | INTRAVENOUS | Status: DC

## 2020-01-17 MED ORDER — LIDOCAINE HCL (PF) 1 % IJ SOLN: 10.0000 mL | Freq: Once | INTRAMUSCULAR | Status: DC

## 2020-01-17 MED ORDER — KETOROLAC TROMETHAMINE 10 MG PO TABS: 10.0000 mg | ORAL_TABLET | Freq: Four times a day (QID) | ORAL | 0 refills | Status: DC | PRN

## 2020-01-17 MED ORDER — KETOROLAC TROMETHAMINE 30 MG/ML IJ SOLN: 30.0000 mg | Freq: Once | INTRAMUSCULAR | Status: DC

## 2020-01-17 MED ORDER — CLINDAMYCIN HCL 300 MG PO CAPS: 300.0000 mg | ORAL_CAPSULE | Freq: Three times a day (TID) | ORAL | 0 refills | Status: AC

## 2020-01-17 NOTE — ED Notes (Signed)
No answer when called several times from lobby 
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
## 2020-01-17 NOTE — Discharge Instructions (Addendum)
Your laceration is too old to put sutures in. Please watch for any signs of infection including redness, drainage, fever. Please be careful to left arm so that you do not pull off the clips holding your wound together. Please follow up with primary care this week for recheck.

## 2021-08-01 ENCOUNTER — Emergency Department
Admission: EM | Admit: 2021-08-01 | Discharge: 2021-08-01 | Disposition: A | Discharge status: Home / Self Care | Payer: No Typology Code available for payment source | Attending: Student in an Organized Health Care Education/Training Program | Admitting: Student in an Organized Health Care Education/Training Program

## 2021-08-01 ENCOUNTER — Other Ambulatory Visit: Payer: Self-pay

## 2021-08-01 ENCOUNTER — Emergency Department: Payer: No Typology Code available for payment source

## 2021-08-01 DIAGNOSIS — Z79899 Other long term (current) drug therapy: Secondary | ICD-10-CM | POA: Diagnosis not present

## 2021-08-01 DIAGNOSIS — F17210 Nicotine dependence, cigarettes, uncomplicated: Secondary | ICD-10-CM | POA: Insufficient documentation

## 2021-08-01 DIAGNOSIS — Y92410 Unspecified street and highway as the place of occurrence of the external cause: Secondary | ICD-10-CM | POA: Diagnosis not present

## 2021-08-01 DIAGNOSIS — S161XXA Strain of muscle, fascia and tendon at neck level, initial encounter: Secondary | ICD-10-CM | POA: Diagnosis not present

## 2021-08-01 DIAGNOSIS — S199XXA Unspecified injury of neck, initial encounter: Secondary | ICD-10-CM | POA: Diagnosis present

## 2021-08-01 MED ORDER — METHOCARBAMOL 500 MG PO TABS: 500.0000 mg | ORAL_TABLET | Freq: Four times a day (QID) | ORAL | 0 refills | Status: DC

## 2021-08-01 MED ORDER — MELOXICAM 15 MG PO TABS: 15.0000 mg | ORAL_TABLET | Freq: Every day | ORAL | 0 refills | Status: DC

## 2021-08-01 MED ORDER — MELOXICAM 7.5 MG PO TABS
15.0000 mg | ORAL_TABLET | Freq: Once | ORAL | Status: AC
  Administered 2021-08-01: 15 mg via ORAL
  Filled 2021-08-01: qty 2

## 2021-08-01 MED ORDER — METHOCARBAMOL 500 MG PO TABS: 1000.0000 mg | ORAL_TABLET | Freq: Once | ORAL | Status: DC

## 2021-08-01 NOTE — ED Notes (Signed)
First nurse called and stated that the patient's ride has shown up.
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
## 2021-08-01 NOTE — ED Notes (Signed)
Patient was soundly sleeping, but awakened easily to voice and touch.
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
## 2021-08-01 NOTE — ED Notes (Signed)
Patient declined discharge vital signs. 
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
## 2022-10-19 ENCOUNTER — Encounter: Payer: Self-pay | Admitting: Emergency Medicine

## 2022-10-19 ENCOUNTER — Other Ambulatory Visit: Payer: Self-pay

## 2022-10-19 ENCOUNTER — Inpatient Hospital Stay
Admission: EM | Admit: 2022-10-19 | Discharge: 2022-10-22 | DRG: 557 | Disposition: A | Discharge status: Home / Self Care | Payer: Medicaid Other | Attending: Student | Admitting: Student

## 2022-10-19 DIAGNOSIS — K219 Gastro-esophageal reflux disease without esophagitis: Secondary | ICD-10-CM | POA: Diagnosis present

## 2022-10-19 DIAGNOSIS — Z8632 Personal history of gestational diabetes: Secondary | ICD-10-CM

## 2022-10-19 DIAGNOSIS — Z9851 Tubal ligation status: Secondary | ICD-10-CM

## 2022-10-19 DIAGNOSIS — G928 Other toxic encephalopathy: Secondary | ICD-10-CM | POA: Diagnosis present

## 2022-10-19 DIAGNOSIS — R451 Restlessness and agitation: Secondary | ICD-10-CM

## 2022-10-19 DIAGNOSIS — Z720 Tobacco use: Secondary | ICD-10-CM

## 2022-10-19 DIAGNOSIS — R739 Hyperglycemia, unspecified: Secondary | ICD-10-CM | POA: Diagnosis present

## 2022-10-19 DIAGNOSIS — F17210 Nicotine dependence, cigarettes, uncomplicated: Secondary | ICD-10-CM | POA: Diagnosis present

## 2022-10-19 DIAGNOSIS — M6282 Rhabdomyolysis: Principal | ICD-10-CM | POA: Diagnosis present

## 2022-10-19 DIAGNOSIS — R4182 Altered mental status, unspecified: Secondary | ICD-10-CM

## 2022-10-19 DIAGNOSIS — R7989 Other specified abnormal findings of blood chemistry: Secondary | ICD-10-CM

## 2022-10-19 DIAGNOSIS — E669 Obesity, unspecified: Secondary | ICD-10-CM | POA: Diagnosis present

## 2022-10-19 DIAGNOSIS — E876 Hypokalemia: Principal | ICD-10-CM

## 2022-10-19 DIAGNOSIS — Z6833 Body mass index (BMI) 33.0-33.9, adult: Secondary | ICD-10-CM

## 2022-10-19 DIAGNOSIS — K121 Other forms of stomatitis: Secondary | ICD-10-CM

## 2022-10-19 DIAGNOSIS — R000 Tachycardia, unspecified: Secondary | ICD-10-CM | POA: Diagnosis present

## 2022-10-19 DIAGNOSIS — Z880 Allergy status to penicillin: Secondary | ICD-10-CM

## 2022-10-19 DIAGNOSIS — F1490 Cocaine use, unspecified, uncomplicated: Secondary | ICD-10-CM

## 2022-10-19 DIAGNOSIS — R9431 Abnormal electrocardiogram [ECG] [EKG]: Secondary | ICD-10-CM

## 2022-10-19 LAB — COMPREHENSIVE METABOLIC PANEL
ALT: 21 U/L (ref 0–44)
AST: 42 U/L — ABNORMAL HIGH (ref 15–41)
Albumin: 3.9 g/dL (ref 3.5–5.0)
Alkaline Phosphatase: 46 U/L (ref 38–126)
Anion gap: 11 (ref 5–15)
BUN: 8 mg/dL (ref 6–20)
CO2: 23 mmol/L (ref 22–32)
Calcium: 8.4 mg/dL — ABNORMAL LOW (ref 8.9–10.3)
Chloride: 102 mmol/L (ref 98–111)
Creatinine, Ser: 0.67 mg/dL (ref 0.44–1.00)
GFR, Estimated: >60 mL/min (ref >60)
Glucose, Bld: 232 mg/dL — ABNORMAL HIGH (ref 70–99)
Potassium: 2.5 mmol/L — CL (ref 3.5–5.1)
Sodium: 136 mmol/L (ref 135–145)
Total Bilirubin: 0.8 mg/dL (ref 0.3–1.2)
Total Protein: 7.5 g/dL (ref 6.5–8.1)

## 2022-10-19 LAB — URINE DRUG SCREEN, QUALITATIVE (ARMC ONLY)
Amphetamines, Ur Screen: NOT DETECTED
Barbiturates, Ur Screen: NOT DETECTED
Benzodiazepine, Ur Scrn: NOT DETECTED
Cannabinoid 50 Ng, Ur ~~LOC~~: POSITIVE — AB
Cocaine Metabolite,Ur ~~LOC~~: POSITIVE — AB
MDMA (Ecstasy)Ur Screen: NOT DETECTED
Methadone Scn, Ur: NOT DETECTED
Opiate, Ur Screen: NOT DETECTED
Phencyclidine (PCP) Ur S: NOT DETECTED
Tricyclic, Ur Screen: NOT DETECTED

## 2022-10-19 LAB — URINALYSIS, ROUTINE W REFLEX MICROSCOPIC
Bilirubin Urine: NEGATIVE
Glucose, UA: NEGATIVE mg/dL
Ketones, ur: 20 mg/dL — AB
Leukocytes,Ua: NEGATIVE
Nitrite: NEGATIVE
Protein, ur: 100 mg/dL — AB
Specific Gravity, Urine: 1.024 (ref 1.005–1.030)
pH: 5 (ref 5.0–8.0)

## 2022-10-19 LAB — CBC
HCT: 37.7 % (ref 36.0–46.0)
Hemoglobin: 12.3 g/dL (ref 12.0–15.0)
MCH: 28.3 pg (ref 26.0–34.0)
MCHC: 32.6 g/dL (ref 30.0–36.0)
MCV: 86.7 fL (ref 80.0–100.0)
Platelets: 356 10*3/uL (ref 150–400)
RBC: 4.35 MIL/uL (ref 3.87–5.11)
RDW: 13.6 % (ref 11.5–15.5)
WBC: 8.7 10*3/uL (ref 4.0–10.5)
nRBC: 0 % (ref 0.0–0.2)

## 2022-10-19 LAB — BASIC METABOLIC PANEL
Anion gap: 10 (ref 5–15)
Anion gap: 9 (ref 5–15)
BUN: 7 mg/dL (ref 6–20)
BUN: 5 mg/dL — ABNORMAL LOW (ref 6–20)
CO2: 22 mmol/L (ref 22–32)
CO2: 23 mmol/L (ref 22–32)
Calcium: 7.8 mg/dL — ABNORMAL LOW (ref 8.9–10.3)
Calcium: 7.5 mg/dL — ABNORMAL LOW (ref 8.9–10.3)
Chloride: 102 mmol/L (ref 98–111)
Chloride: 104 mmol/L (ref 98–111)
Creatinine, Ser: 0.58 mg/dL (ref 0.44–1.00)
Creatinine, Ser: 0.63 mg/dL (ref 0.44–1.00)
GFR, Estimated: >60 mL/min (ref >60)
GFR, Estimated: >60 mL/min (ref >60)
Glucose, Bld: 136 mg/dL — ABNORMAL HIGH (ref 70–99)
Glucose, Bld: 106 mg/dL — ABNORMAL HIGH (ref 70–99)
Potassium: 2.7 mmol/L — CL (ref 3.5–5.1)
Potassium: 2.8 mmol/L — ABNORMAL LOW (ref 3.5–5.1)
Sodium: 134 mmol/L — ABNORMAL LOW (ref 135–145)
Sodium: 136 mmol/L (ref 135–145)

## 2022-10-19 LAB — CK
Total CK: 1886 U/L — ABNORMAL HIGH (ref 38–234)
Total CK: 1492 U/L — ABNORMAL HIGH (ref 38–234)

## 2022-10-19 LAB — SALICYLATE LEVEL: Salicylate Lvl: <7 mg/dL — ABNORMAL LOW (ref 7.0–30.0)

## 2022-10-19 LAB — PREGNANCY, URINE: Preg Test, Ur: NEGATIVE

## 2022-10-19 LAB — ACETAMINOPHEN LEVEL
Acetaminophen (Tylenol), Serum: 11 ug/mL (ref 10–30)
Acetaminophen (Tylenol), Serum: <10 ug/mL — ABNORMAL LOW (ref 10–30)

## 2022-10-19 LAB — MAGNESIUM: Magnesium: 1.9 mg/dL (ref 1.7–2.4)

## 2022-10-19 LAB — TROPONIN I (HIGH SENSITIVITY)
Troponin I (High Sensitivity): 35 ng/L — ABNORMAL HIGH (ref <18)
Troponin I (High Sensitivity): 9 ng/L (ref <18)

## 2022-10-19 LAB — ETHANOL: Alcohol, Ethyl (B): <10 mg/dL (ref <10)

## 2022-10-19 MED ORDER — SODIUM CHLORIDE 0.9 % IV BOLUS
1000.0000 mL | Freq: Once | INTRAVENOUS | Status: AC
  Administered 2022-10-19: 1000 mL via INTRAVENOUS

## 2022-10-19 MED ORDER — LIDOCAINE VISCOUS HCL 2 % MT SOLN: 15.0000 mL | Freq: Once | OROMUCOSAL | Status: DC

## 2022-10-19 MED ORDER — SODIUM CHLORIDE 0.9 % IV BOLUS (SEPSIS)
1000.0000 mL | Freq: Once | INTRAVENOUS | Status: AC
  Administered 2022-10-20: 1000 mL via INTRAVENOUS

## 2022-10-19 MED ORDER — ACYCLOVIR 400 MG PO TABS: 400.0000 mg | ORAL_TABLET | Freq: Every day | ORAL | 0 refills | Status: DC

## 2022-10-19 MED ORDER — SODIUM CHLORIDE 0.9 % IV BOLUS
1000.0000 mL | Freq: Once | INTRAVENOUS | Status: AC
Start: 2022-10-19 — End: 2022-10-19
  Administered 2022-10-19: 1000 mL via INTRAVENOUS

## 2022-10-19 MED ORDER — LORAZEPAM 2 MG/ML IJ SOLN
1.0000 mg | Freq: Once | INTRAMUSCULAR | Status: AC
  Administered 2022-10-19: 1 mg via INTRAVENOUS
  Filled 2022-10-19: qty 1

## 2022-10-19 MED ORDER — POTASSIUM CHLORIDE 10 MEQ/100ML IV SOLN
10.0000 meq | INTRAVENOUS | Status: DC
  Administered 2022-10-19: 10 meq via INTRAVENOUS
  Filled 2022-10-19: qty 100

## 2022-10-19 MED ORDER — LORAZEPAM 2 MG/ML IJ SOLN
0.5000 mg | Freq: Once | INTRAMUSCULAR | Status: AC
  Administered 2022-10-19: 0.5 mg via INTRAVENOUS
  Filled 2022-10-19: qty 1

## 2022-10-19 MED ORDER — POTASSIUM CHLORIDE 10 MEQ/100ML IV SOLN
10.0000 meq | INTRAVENOUS | Status: AC
  Administered 2022-10-19 (×4): 10 meq via INTRAVENOUS
  Filled 2022-10-19 (×2): qty 100
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
## 2022-10-19 NOTE — ED Triage Notes (Addendum)
Pt via POV from home. States she smoked marijuana with "salt" in it and now she is having a reaction to it. States she smoke it this AM and now having this reaction after it. Pt reports restlessness, fidgeting, shakiness. States that she also has ulcers in her mouth. Denies any pain. On arrival, pt is very restless, states she has never had a reaction like this before. Pt is A&OX4 and NAD, calm and cooperative during triage.

## 2022-10-19 NOTE — ED Notes (Signed)
Pt rate changed from 155m/hr to 536mhour on potassium IV infusion due to patient appearing uncomfortable. Pt was noted to start throwing arm around once the IV infusion started. Once switched to slower rate, pt has returned to resting comfortably. Significant other remains at bedside at this time.

## 2022-10-19 NOTE — ED Notes (Signed)
Pt appears to may have calmed down and resting some at this time.

## 2022-10-19 NOTE — ED Notes (Signed)
Pt placed on pulse ox at this time. Pt rails up so pt doesn't fall. Pt continues to thrash around in the bed. Pt unable to stay still. Pt throwing arms and legs up in air. Pt pulling on girlfriends' hair.  Pt continues to grunt and moan. Pt states mouth is hurting.

## 2022-10-19 NOTE — ED Notes (Signed)
Pt tossing around in bed. Pt swinging arms around and grunting. Pt states her mouth hurts. Pt moaning and groaning.

## 2022-10-19 NOTE — ED Notes (Signed)
MD Jari Pigg assessing pt at this time. Pt also states this has been going on for two days she has been using marijuana and crack. Pt also states ulcers in mouth.

## 2022-10-19 NOTE — ED Notes (Signed)
Mother updated per patient request at this time.

## 2022-10-19 NOTE — ED Notes (Signed)
Pt is heard snoring. Noted chest rise and fall and even RR.

## 2022-10-19 NOTE — ED Notes (Signed)
Pt resting comfortably at this time. Family at bedside. Medications started.

## 2022-10-19 NOTE — ED Notes (Signed)
Pt hooked up to continuous pulse ox monitor per EDP Jari Pigg

## 2022-10-19 NOTE — ED Provider Notes (Addendum)
[facility] Provider Note    Event Date/Time   First MD Initiated Contact with Patient 10/19/22 1456     (approximate)   History   Drug Reaction   HPI  Tina [name] is a 42 y.o. female who comes in with concern for a drug reaction.  Patient reports smoking marijuana with "salt" in it.  She reports feeling restless, fidgety, shaky.  Patient reports that she used this yesterday and since then she has not been able to sleep and she has been very shaky.  Denies any obvious chest pain but more just issues with restlessness.  Denies ever using this previously.  Denies any falls or hitting her head or any other concerns.  Patient denies any SI.  She does report having an ulcer in the inner lip that is really bothering her.  Patient's girlfriend is at bedside and denies any concerns for SI as well.  Denies any known trauma as well.     Physical Exam   Triage Vital Signs: ED Triage Vitals  Enc Vitals Group     BP 10/19/22 1448 130/80     Pulse Rate 10/19/22 1448 (!) 106     Resp 10/19/22 1448 20     Temp --      Temp src --      SpO2 10/19/22 1448 97 %     Weight 10/19/22 1442 204 lb (92.5 kg)     Height 10/19/22 1442 '5\' 5"'$  (1.651 m)     Head Circumference --      Peak Flow --      Pain Score --      Pain Loc --      Pain Edu? --      Excl. in [place]? --     Most recent vital signs: Vitals:   10/19/22 1448  BP: 130/80  Pulse: (!) 106  Resp: 20  SpO2: 97%     General: Awake, no distress.  CV:  Good peripheral perfusion.  Resp:  Normal effort.  Abd:  No distention.  Other:  Patient continues to thrash around in bed, grunting, moving all extremities.  No evidence of trauma to the head. Patient has an ulceration in her mouth on the bottom inner lip.  No lesions on the outer lips.  ED Results / Procedures / Treatments   Labs (all labs ordered are listed, but only abnormal results are displayed) Labs Reviewed  URINALYSIS, ROUTINE W REFLEX  MICROSCOPIC - Abnormal; Notable for the following components:      Result Value   Color, Urine YELLOW (*)    APPearance HAZY (*)    Hgb urine dipstick SMALL (*)    Ketones, ur 20 (*)    Protein, ur 100 (*)    Bacteria, UA RARE (*)    All other components within normal limits  COMPREHENSIVE METABOLIC PANEL - Abnormal; Notable for the following components:   Potassium 2.5 (*)    Glucose, Bld 232 (*)    Calcium 8.4 (*)    AST 42 (*)    All other components within normal limits  URINE DRUG SCREEN, QUALITATIVE ([facility] ONLY) - Abnormal; Notable for the following components:   Cocaine Metabolite,Ur [place] POSITIVE (*)    Cannabinoid 50 Ng, Ur [place] POSITIVE (*)    All other components within normal limits  SALICYLATE LEVEL - Abnormal; Notable for the following components:   Salicylate Lvl [id number] (*)    All other components within normal limits  CK - Abnormal; Notable for the following components:   Total CK 1,886 (*)    All other components within normal limits  TROPONIN I (HIGH SENSITIVITY) - Abnormal; Notable for the following components:   Troponin I (High Sensitivity) 35 (*)    All other components within normal limits  ETHANOL  CBC  PREGNANCY, URINE  ACETAMINOPHEN LEVEL  MAGNESIUM     EKG  My interpretation of EKG:  Sinus tachycardia rate of 107 without any ST elevation no T wave inversions little bit of ST depressions QTc 531    PROCEDURES:  Critical Care performed: No  .1-3 Lead EKG Interpretation  Performed by: Vanessa [place], MD Authorized by: Vanessa Sharon, MD     Interpretation: abnormal     ECG rate:  80   ECG rate assessment: normal     Rhythm: sinus rhythm     Ectopy: none     Conduction: normal      MEDICATIONS ORDERED IN ED: Medications  lidocaine (XYLOCAINE) 2 % viscous mouth solution 15 mL (has no administration in time range)  potassium chloride 10 mEq in 100 mL IVPB (10 mEq Intravenous New Bag/Given 10/19/22 1623)  sodium chloride 0.9 % bolus 1,000 mL  (0 mLs Intravenous Stopped 10/19/22 1624)  LORazepam (ATIVAN) injection 0.5 mg (0.5 mg Intravenous Given 10/19/22 1523)  LORazepam (ATIVAN) injection 1 mg (1 mg Intravenous Given 10/19/22 1549)     IMPRESSION / MDM / ASSESSMENT AND PLAN / ED COURSE  I reviewed the triage vital signs and the nursing notes.   Patient's presentation is most consistent with acute presentation with potential threat to life or bodily function.   Patient comes in with altered mental status, agitation after drug abuse.  EKG with some mild depressions but no ST elevation.  Will get labs to evaluate for rhabdo, ACS, Electra abnormalities, AKI.  No evidence of head trauma patient moving all extremities.  No known falls.  Patient given 0.5 IV Ativan but patient still moving all around.  Patient is a single ulcer noted in her mouth.  Will cover her for possible herpes gingivitis with some acyclovir.  Pregnancy test was negative.  Urine with some slight ketones in it.  UDS positive for cocaine.  CMP shows elevated potassium of 2.5 patient getting some IV repletion.  EtOH negative CBC reassuring salicylate negative Tylenol slightly elevated we will get a repeat in 4 hours.  CK level is elevated troponin elevated slightly suspect demand.   4:36 PM patient given a dose of additional 1 mg IV Ativan and patient's more restful.   Troponin is downtrending from 35-9 we will place a cardiology referral but suspect this is demand related to the cocaine use.  She denied any active chest pain or shortness of breath.  Patient be handed off to oncoming team pending repeat EKG, BMP after potassium infusion.  Sober reevaluation.  11:54 PM reevaluated patient alert and oriented x 4.  She again denies any falls and partner also denies any falls.  She has no neck tenderness and moving all extremities well. No head pain. No head trauma noted.  Patient handed off to oncoming team pending repeat blood work and sober reevaluation  The patient is  on the cardiac monitor to evaluate for evidence of arrhythmia and/or significant heart rate changes.      FINAL CLINICAL IMPRESSION(S) / ED DIAGNOSES   Final diagnoses:  Cocaine use  Agitation  Hypokalemia  Elevated troponin     Rx /  DC Orders   ED Discharge Orders          Ordered    acyclovir (ZOVIRAX) 400 MG tablet  5 times daily        10/19/22 1651    Ambulatory referral to Cardiology        10/19/22 2309             Note:  This document was prepared using Dragon voice recognition software and may include unintentional dictation errors.   Vanessa Dodge, MD 10/19/22 2310    Vanessa Hemlock, MD 10/19/22 [phone]

## 2022-10-19 NOTE — ED Notes (Signed)
Pt K+ is 2.5 MD Funke informed.

## 2022-10-19 NOTE — ED Notes (Signed)
Pt had soiled bed and was asking to the bathroom, Probation officer and RN summer assisted pt bathroom. Bed sheets and blanket was changed and pt placed into hospital blue scrub pants. Writer placed pt's belongings in two bags, labeled soiled and dry

## 2022-10-19 NOTE — ED Notes (Signed)
Pt continues to thrash around in the bed. Family at bedside. Pt grunting continuously and begging for help.

## 2022-10-19 NOTE — ED Notes (Signed)
Myrle Bahri (pt's mother) [phone]
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
## 2022-10-19 NOTE — ED Notes (Signed)
Pt placed on cardiac monitor. Pt repeat trop collected at this time.

## 2022-10-19 NOTE — ED Provider Notes (Signed)
11:50 PM  Assumed care at signout.  Patient here with altered mental status after smoking marijuana with "salt in it".  Received sedation here.  Found to be hypokalemic with QTc prolongation and elevated CK.  Getting hydration and IV potassium replacement and will need repeat BMP, CK and EKG. CT head also pending.    EKG Interpretation  Date/Time:  Monday October 20 2022 00:08:37 EST Ventricular Rate:  88 PR Interval:  132 QRS Duration: 78 QT Interval:  442 QTC Calculation: 534 R Axis:   89 Text Interpretation:  Poor data quality, interpretation may be adversely affected Normal sinus rhythm Septal infarct , age undetermined Possible Lateral infarct , age undetermined Prolonged QT Abnormal ECG When compared with ECG of 19-Oct-2022 14:50, (unconfirmed) ST no longer depressed in Inferior leads ST no longer depressed in Lateral leads T wave inversion no longer evident in Inferior leads T wave inversion more evident in Anterior leads Confirmed by Emme Rosenau, Cyril Mourning [id number]) on 10/20/2022 12:36:17 AM       12:40 AM  Pt still very drowsy but arousable.  CT head reviewed and interpreted by myself and radiologist and is unremarkable.  Repeat EKG still shows prolonged QT interval with no significant change compared to previous.  We have no old to compare these to so it is unclear if this is new or old for her.  Repeat potassium still low at 2.9.  Will give further IV and oral replacement.  This will be the fifth time that her potassium will need to be rechecked.  It initially started off at 2.5 and has only risen slightly to 2.9 with continued EKG changes.  Will admit to the hospitalist.  Family comfortable with this plan.   1:10 AM  Consulted and discussed patient's case with hospitalist, Dr. Sidney Ace.  I have recommended admission and consulting physician agrees and will place admission orders.  Patient (and family if present) agree with this plan.   I reviewed all nursing notes, vitals, pertinent previous  records.  All labs, EKGs, imaging ordered have been independently reviewed and interpreted by myself.   CRITICAL CARE Performed by: Pryor Curia   Total critical care time: 35 minutes  Critical care time was exclusive of separately billable procedures and treating other patients.  Critical care was necessary to treat or prevent imminent or life-threatening deterioration.  Critical care was time spent personally by me on the following activities: development of treatment plan with patient and/or surrogate as well as nursing, discussions with consultants, evaluation of patient's response to treatment, examination of patient, obtaining history from patient or surrogate, ordering and performing treatments and interventions, ordering and review of laboratory studies, ordering and review of radiographic studies, pulse oximetry and re-evaluation of patient's condition.    Sonya Pucci, Delice Bison, DO 10/20/22 0110

## 2022-10-19 NOTE — ED Notes (Signed)
Lab called to run blood now that orders have been placed.

## 2022-10-19 NOTE — ED Notes (Signed)
Pt K+ 2.7 MD Funke informed at this time.

## 2022-10-19 NOTE — ED Notes (Signed)
Pt instructed to give urine sample when able to. Family at bedside with pt at this time.

## 2022-10-20 ENCOUNTER — Emergency Department: Payer: Medicaid Other

## 2022-10-20 DIAGNOSIS — Z6833 Body mass index (BMI) 33.0-33.9, adult: Secondary | ICD-10-CM | POA: Diagnosis not present

## 2022-10-20 DIAGNOSIS — G928 Other toxic encephalopathy: Secondary | ICD-10-CM | POA: Diagnosis not present

## 2022-10-20 DIAGNOSIS — F1490 Cocaine use, unspecified, uncomplicated: Secondary | ICD-10-CM

## 2022-10-20 DIAGNOSIS — R739 Hyperglycemia, unspecified: Secondary | ICD-10-CM | POA: Diagnosis not present

## 2022-10-20 DIAGNOSIS — F17210 Nicotine dependence, cigarettes, uncomplicated: Secondary | ICD-10-CM | POA: Diagnosis not present

## 2022-10-20 DIAGNOSIS — R000 Tachycardia, unspecified: Secondary | ICD-10-CM | POA: Diagnosis not present

## 2022-10-20 DIAGNOSIS — Z9851 Tubal ligation status: Secondary | ICD-10-CM | POA: Diagnosis not present

## 2022-10-20 DIAGNOSIS — K219 Gastro-esophageal reflux disease without esophagitis: Secondary | ICD-10-CM | POA: Diagnosis not present

## 2022-10-20 DIAGNOSIS — E876 Hypokalemia: Secondary | ICD-10-CM

## 2022-10-20 DIAGNOSIS — Z720 Tobacco use: Secondary | ICD-10-CM | POA: Diagnosis not present

## 2022-10-20 DIAGNOSIS — E669 Obesity, unspecified: Secondary | ICD-10-CM | POA: Diagnosis not present

## 2022-10-20 DIAGNOSIS — M6282 Rhabdomyolysis: Secondary | ICD-10-CM | POA: Diagnosis not present

## 2022-10-20 DIAGNOSIS — Z8632 Personal history of gestational diabetes: Secondary | ICD-10-CM | POA: Diagnosis not present

## 2022-10-20 DIAGNOSIS — Z880 Allergy status to penicillin: Secondary | ICD-10-CM | POA: Diagnosis not present

## 2022-10-20 LAB — BASIC METABOLIC PANEL
Anion gap: 10 (ref 5–15)
Anion gap: 12 (ref 5–15)
Anion gap: 8 (ref 5–15)
BUN: <5 mg/dL — ABNORMAL LOW (ref 6–20)
BUN: <5 mg/dL — ABNORMAL LOW (ref 6–20)
BUN: <5 mg/dL — ABNORMAL LOW (ref 6–20)
CO2: 21 mmol/L — ABNORMAL LOW (ref 22–32)
CO2: 22 mmol/L (ref 22–32)
CO2: 22 mmol/L (ref 22–32)
Calcium: 7.1 mg/dL — ABNORMAL LOW (ref 8.9–10.3)
Calcium: 7.5 mg/dL — ABNORMAL LOW (ref 8.9–10.3)
Calcium: 7.8 mg/dL — ABNORMAL LOW (ref 8.9–10.3)
Chloride: 101 mmol/L (ref 98–111)
Chloride: 105 mmol/L (ref 98–111)
Chloride: 105 mmol/L (ref 98–111)
Creatinine, Ser: 0.57 mg/dL (ref 0.44–1.00)
Creatinine, Ser: 0.57 mg/dL (ref 0.44–1.00)
Creatinine, Ser: 0.59 mg/dL (ref 0.44–1.00)
GFR, Estimated: >60 mL/min (ref >60)
GFR, Estimated: >60 mL/min (ref >60)
GFR, Estimated: >60 mL/min (ref >60)
Glucose, Bld: 122 mg/dL — ABNORMAL HIGH (ref 70–99)
Glucose, Bld: 126 mg/dL — ABNORMAL HIGH (ref 70–99)
Glucose, Bld: 161 mg/dL — ABNORMAL HIGH (ref 70–99)
Potassium: 2.7 mmol/L — CL (ref 3.5–5.1)
Potassium: 2.8 mmol/L — ABNORMAL LOW (ref 3.5–5.1)
Potassium: 2.9 mmol/L — ABNORMAL LOW (ref 3.5–5.1)
Sodium: 134 mmol/L — ABNORMAL LOW (ref 135–145)
Sodium: 135 mmol/L (ref 135–145)
Sodium: 137 mmol/L (ref 135–145)

## 2022-10-20 LAB — CBC
HCT: 35.5 % — ABNORMAL LOW (ref 36.0–46.0)
Hemoglobin: 11.5 g/dL — ABNORMAL LOW (ref 12.0–15.0)
MCH: 28.1 pg (ref 26.0–34.0)
MCHC: 32.4 g/dL (ref 30.0–36.0)
MCV: 86.8 fL (ref 80.0–100.0)
Platelets: 289 10*3/uL (ref 150–400)
RBC: 4.09 MIL/uL (ref 3.87–5.11)
RDW: 13.8 % (ref 11.5–15.5)
WBC: 6.3 10*3/uL (ref 4.0–10.5)
nRBC: 0 % (ref 0.0–0.2)

## 2022-10-20 LAB — MAGNESIUM: Magnesium: 1.9 mg/dL (ref 1.7–2.4)

## 2022-10-20 LAB — VITAMIN D 25 HYDROXY (VIT D DEFICIENCY, FRACTURES): Vit D, 25-Hydroxy: 14.22 ng/mL — ABNORMAL LOW (ref 30–100)

## 2022-10-20 LAB — HIV ANTIBODY (ROUTINE TESTING W REFLEX): HIV Screen 4th Generation wRfx: NONREACTIVE

## 2022-10-20 LAB — PHOSPHORUS: Phosphorus: 1.8 mg/dL — ABNORMAL LOW (ref 2.5–4.6)

## 2022-10-20 LAB — CK: Total CK: 1062 U/L — ABNORMAL HIGH (ref 38–234)

## 2022-10-20 MED ORDER — ENOXAPARIN SODIUM 60 MG/0.6ML IJ SOSY
0.5000 mg/kg | PREFILLED_SYRINGE | INTRAMUSCULAR | Status: DC
  Administered 2022-10-20 – 2022-10-21 (×2): 47.5 mg via SUBCUTANEOUS
  Filled 2022-10-20 (×2): qty 0.6

## 2022-10-20 MED ORDER — TRAZODONE HCL 50 MG PO TABS: 25.0000 mg | ORAL_TABLET | Freq: Every evening | ORAL | Status: DC | PRN

## 2022-10-20 MED ORDER — POTASSIUM PHOSPHATES 15 MMOLE/5ML IV SOLN
30.0000 mmol | Freq: Once | INTRAVENOUS | Status: AC
  Administered 2022-10-20: 30 mmol via INTRAVENOUS
  Filled 2022-10-20: qty 10

## 2022-10-20 MED ORDER — POTASSIUM CHLORIDE IN NACL 20-0.9 MEQ/L-% IV SOLN
INTRAVENOUS | Status: DC
  Filled 2022-10-20: qty 1000

## 2022-10-20 MED ORDER — SODIUM CHLORIDE 0.9 % IV SOLN: INTRAVENOUS | Status: DC

## 2022-10-20 MED ORDER — ACETAMINOPHEN 325 MG PO TABS: 650.0000 mg | ORAL_TABLET | Freq: Four times a day (QID) | ORAL | Status: DC | PRN

## 2022-10-20 MED ORDER — POTASSIUM CHLORIDE 10 MEQ/100ML IV SOLN
10.0000 meq | INTRAVENOUS | Status: AC
  Administered 2022-10-20 (×6): 10 meq via INTRAVENOUS
  Filled 2022-10-20 (×4): qty 100

## 2022-10-20 MED ORDER — POTASSIUM CHLORIDE 10 MEQ/100ML IV SOLN
10.0000 meq | INTRAVENOUS | Status: AC
  Administered 2022-10-20 (×2): 10 meq via INTRAVENOUS
  Filled 2022-10-20 (×2): qty 100

## 2022-10-20 MED ORDER — POTASSIUM CHLORIDE 20 MEQ PO PACK
40.0000 meq | PACK | Freq: Once | ORAL | Status: AC
  Administered 2022-10-20: 40 meq via ORAL
  Filled 2022-10-20: qty 2

## 2022-10-20 MED ORDER — ACETAMINOPHEN 650 MG RE SUPP: 650.0000 mg | Freq: Four times a day (QID) | RECTAL | Status: DC | PRN

## 2022-10-20 MED ORDER — MAGNESIUM HYDROXIDE 400 MG/5ML PO SUSP: 30.0000 mL | Freq: Every day | ORAL | Status: DC | PRN

## 2022-10-20 MED ORDER — BENZOCAINE 10 % MT GEL
Freq: Three times a day (TID) | OROMUCOSAL | Status: DC | PRN
  Filled 2022-10-20: qty 9

## 2022-10-20 NOTE — Assessment & Plan Note (Signed)
-   This is likely secondary to cocaine. - The patient will be admitted to a medical telemetry bed. - We will continue aggressive hydration with IV normal saline. - We will follow serial CK levels.

## 2022-10-20 NOTE — ED Notes (Addendum)
Pt now ambulating independently to bathroom and back to stretcher. Pt with steady gait at this time. Significant other remains at bedside at this time.

## 2022-10-20 NOTE — Assessment & Plan Note (Signed)
-   Potassium will be replaced and followed. - Magnesium level will be checked.

## 2022-10-20 NOTE — Assessment & Plan Note (Signed)
-   She had subsequent altered mental status/acute encephalopathy. - She will be monitored while she is here.

## 2022-10-20 NOTE — ED Notes (Signed)
This RN and Maudie Mercury, Advertising copywriter to bedside. Pt. And visitor sleeping. Pt. Remained asleep during blood draw, snoring loudly. Primary RN notified.

## 2022-10-20 NOTE — ED Notes (Signed)
Pt to CT at this time.

## 2022-10-20 NOTE — Progress Notes (Signed)
PHARMACIST - PHYSICIAN COMMUNICATION  CONCERNING:  Enoxaparin (Lovenox) for DVT Prophylaxis    RECOMMENDATION: Patient was prescribed enoxaprin '40mg'$  q24 hours for VTE prophylaxis.   Filed Weights   10/19/22 1442  Weight: 92.5 kg (204 lb)    Body mass index is 33.95 kg/m.  Estimated Creatinine Clearance: 105.1 mL/min (by C-G formula based on SCr of 0.59 mg/dL).   Based on [place] patient is candidate for enoxaparin 0.'5mg'$ /kg TBW SQ every 24 hours based on BMI being >30.  DESCRIPTION: Pharmacy has adjusted enoxaparin dose per [facility] policy.  Patient is now receiving enoxaparin 0.5 mg/kg every 24 hours   Renda Rolls, PharmD, [facility] 10/20/2022 1:48 AM

## 2022-10-20 NOTE — Assessment & Plan Note (Signed)
-   She will be counseled for smoking cessation.

## 2022-10-20 NOTE — Plan of Care (Signed)
Patient was seen and examined at bedside, patient was having some involuntary movements, seems to be withdrawing from cocaine abuse.  Patient was unable to offer any complaints, seems to be resting fine.  Patient was hungry and wanted to eat food.  Diet was ordered. Patient has persistent hypokalemia, potassium was repleted. Will continue to monitor and continue current treatment.

## 2022-10-20 NOTE — H&P (Addendum)
[place]   PATIENT NAME: Tina [name]    MR#:  [id number]  DATE OF BIRTH:  Mar 18, 1981  DATE OF ADMISSION:  10/19/2022  PRIMARY CARE PHYSICIAN: Pcp, No   Patient is coming from: Home  REQUESTING/REFERRING PHYSICIAN: Ward, Delice Bison, DO  CHIEF COMPLAINT:   Chief Complaint  Patient presents with   Drug Reaction    HISTORY OF PRESENT ILLNESS:  [name] is a 42 y.o. African-American female with medical history significant for GERD, depression, gestational diabetes, migraine, and postpartum depression, who presented to the emergency room for concern about drug reaction.  The patient was smoking marijuana with "salt" in it.  She has been feeling restless and shaky since then and has not been able to sleep.  She has been fairly restless.  No recent falls or trauma or head injuries.  She denies any suicidal ideation.  No fever or chills.  She was fairly somnolent during my interview but arousable.  No nausea or vomiting or abdominal pain.  No dysuria, oliguria or hematuria or flank pain.  No chest pain or palpitations.  No cough or wheezing or hemoptysis.  ED Course: When she came to the ER, heart rate was 106 with otherwise normal vital signs.  Labs revealed hypokalemia of 2.5 and total CK of 1886 and later 1492 with high sensitive troponin I of 35 and later 9.  CBC was within normal.  Tylenol level was 11 and later less than 10 with salicylate level of less than 7.  Serum pregnancy test was negative.  Urinalysis was negative.  Urine drug screen was positive for cocaine and marijuana.  EKG as reviewed by me : EKG showed sinus tachycardia with a rate of 107 with Q waves in through septally and ST segment depression inferiorly.   Imaging: Noncontrasted CT scan revealed no acute intra abnormalities and C-spine CT showed no acute fracture or static subluxation of the C-spine.  The patient was given 0.5 mg of IV Ativan and 1 mg IV, 40 mill equivalent p.o. potassium chloride and  10 mill equivalent IV potassium chloride as well as 3 L IV normal saline bolus.  She will be admitted to a medical telemetry bed for further evaluation and management. 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   Status post cesarean delivery 04/23/2012   Trichimoniasis     PAST SURGICAL HISTORY:   Past Surgical History:  Procedure Laterality Date   CESAREAN SECTION  2005   x4;FIRST CHILD IN FETAL DISTRESS   CESAREAN SECTION  04/23/2012   Procedure: CESAREAN SECTION;  Surgeon: Delice Lesch, MD;  Location: [facility];  Service: Obstetrics;  Laterality: N/A;   CESAREAN SECTION N/A 05/03/2013   Procedure: REPEAT CESAREAN SECTION WITH POSS ABDOMINAL HYSTERECTOMY;  Surgeon: Harvie Bridge  Mancel Bale, MD;  Location: [facility];  Service: Obstetrics;  Laterality: N/A;   CESAREAN SECTION N/A 12/12/2014   Procedure: CESAREAN SECTION REPEAT;  Surgeon: Lavonia Drafts, MD;  Location: [facility];  Service: Obstetrics;  Laterality: N/A;  Tracie S confirmed FA   TUBAL LIGATION      SOCIAL HISTORY:   Social History   Tobacco Use   Smoking status: Light Smoker    Packs/day: 0.25    Types: Cigarettes    Last attempt to quit: 05/24/2014    Years since quitting: 8.4   Smokeless tobacco: Never  Substance Use Topics   Alcohol use: Yes    Comment: occasional    FAMILY HISTORY:   Family History  Problem Relation Age of Onset    Hypertension Paternal Grandfather    COPD Mother        Bronchitis   Asthma Brother    COPD Brother        Bronchitis   Hypertension Paternal Grandmother    Other Paternal Grandmother        SPIDER VEINS   Other Paternal Aunt        SPIDER VEINS   Seizures Cousin        PATERNAL   Anesthesia problems Neg Hx    Hypotension Neg Hx    Malignant hyperthermia Neg Hx    Pseudochol deficiency Neg Hx     DRUG ALLERGIES:   Allergies  Allergen Reactions   Penicillins     REVIEW OF SYSTEMS:   ROS As per history of present illness. All pertinent systems were reviewed above. Constitutional, HEENT, cardiovascular, respiratory, GI, GU, musculoskeletal, neuro, psychiatric, endocrine, integumentary and hematologic systems were reviewed and are otherwise negative/unremarkable except for positive findings mentioned above in the HPI.   MEDICATIONS AT HOME:   Prior to Admission medications   Medication Sig Start Date End Date Taking? Authorizing Provider  acyclovir (ZOVIRAX) 400 MG tablet Take 1 tablet (400 mg total) by mouth 5 (five) times daily for 5 days. 10/19/22 10/24/22 Yes Vanessa Harrison, MD  hydrOXYzine (ATARAX/VISTARIL) 25 MG tablet 1-2 tablets every 6 hours as needed for itching. Patient not taking: Reported on 10/19/2022 08/23/16   Sherrie George B, FNP  ibuprofen (ADVIL,MOTRIN) 800 MG tablet Take 1 tablet (800 mg total) by mouth every 8 (eight) hours as needed. Patient not taking: Reported on 10/19/2022 04/24/16   Mortimer Fries, PA-C  ketorolac (TORADOL) 10 MG tablet Take 1 tablet (10 mg total) by mouth every 6 (six) hours as needed. Patient not taking: Reported on 10/19/2022 01/17/20   Laban Emperor, PA-C  meloxicam (MOBIC) 15 MG tablet Take 1 tablet (15 mg total) by mouth daily. Patient not taking: Reported on 10/19/2022 08/01/21   Cuthriell, Charline Bills, PA-C  methocarbamol (ROBAXIN) 500 MG tablet Take 1 tablet (500 mg total) by mouth 4 (four) times daily. Patient not taking: Reported on  10/19/2022 08/01/21   Cuthriell, Charline Bills, PA-C  ondansetron (ZOFRAN ODT) 4 MG disintegrating tablet Take 1 tablet (4 mg total) by mouth every 8 (eight) hours as needed for nausea or vomiting. Patient not taking: Reported on 10/19/2022 04/07/17   Gregor Hams, MD  polyethylene glycol [facility] / Floria Raveling) packet Take 17 g by mouth daily as needed for moderate constipation. Patient not taking: Reported on 10/19/2022 01/17/15   Lavonia Drafts, MD      VITAL SIGNS:  Blood pressure 129/89, pulse 86, temperature 98 F (36.7 C), resp. rate 17, height '5\' 5"'$  (1.651 m), weight  92.5 kg, last menstrual period 09/25/2022, SpO2 99 %.  PHYSICAL EXAMINATION:  Physical Exam  GENERAL:  42 y.o.-year-old African-American female patient lying in the bed with no acute distress.  She is very somnolent but arousable.   EYES: Pupils equal, round, reactive to light and accommodation. No scleral icterus. Extraocular muscles intact.  HEENT: Head atraumatic, normocephalic. Oropharynx and nasopharynx clear.  NECK:  Supple, no jugular venous distention. No thyroid enlargement, no tenderness.  LUNGS: Normal breath sounds bilaterally, no wheezing, rales,rhonchi or crepitation. No use of accessory muscles of respiration.  CARDIOVASCULAR: Regular rate and rhythm, S1, S2 normal. No murmurs, rubs, or gallops.  ABDOMEN: Soft, nondistended, nontender. Bowel sounds present. No organomegaly or mass.  EXTREMITIES: No pedal edema, cyanosis, or clubbing.  NEUROLOGIC: Cranial nerves II through XII are intact. Muscle strength 5/5 in all extremities. Sensation intact. Gait not checked.  PSYCHIATRIC: The patient is somnolent but arousable. SKIN: No obvious rash, lesion, or ulcer.   LABORATORY PANEL:   CBC Recent Labs  Lab 10/19/22 1449  WBC 8.7  HGB 12.3  HCT 37.7  PLT 356   ------------------------------------------------------------------------------------------------------------------  Chemistries  Recent Labs   Lab 10/19/22 1449 10/19/22 1640 10/20/22 0003  NA 136   < > 134*  K 2.5*   < > 2.9*  CL 102   < > 101  CO2 23   < > 21*  GLUCOSE 232*   < > 126*  BUN 8   < > <5*  CREATININE 0.67   < > 0.59  CALCIUM 8.4*   < > 7.8*  MG 1.9  --   --   AST 42*  --   --   ALT 21  --   --   ALKPHOS 46  --   --   BILITOT 0.8  --   --    < > = values in this interval not displayed.   ------------------------------------------------------------------------------------------------------------------  Cardiac Enzymes No results for input(s): "TROPONINI" in the last 168 hours. ------------------------------------------------------------------------------------------------------------------  RADIOLOGY:  CT HEAD WO CONTRAST (5MM)  Result Date: 10/20/2022 CLINICAL DATA:  Head trauma EXAM: CT HEAD WITHOUT CONTRAST CT CERVICAL SPINE WITHOUT CONTRAST TECHNIQUE: Multidetector CT imaging of the head and cervical spine was performed following the standard protocol without intravenous contrast. Multiplanar CT image reconstructions of the cervical spine were also generated. RADIATION DOSE REDUCTION: This exam was performed according to the departmental dose-optimization program which includes automated exposure control, adjustment of the mA and/or kV according to patient size and/or use of iterative reconstruction technique. COMPARISON:  None Available. FINDINGS: CT HEAD FINDINGS Brain: There is no mass, hemorrhage or extra-axial collection. The size and configuration of the ventricles and extra-axial CSF spaces are normal. The brain parenchyma is normal, without evidence of acute or chronic infarction. Vascular: No abnormal hyperdensity of the major intracranial arteries or dural venous sinuses. No intracranial atherosclerosis. Skull: The visualized skull base, calvarium and extracranial soft tissues are normal. Sinuses/Orbits: No fluid levels or advanced mucosal thickening of the visualized paranasal sinuses. No mastoid or  middle ear effusion. The orbits are normal. CT CERVICAL SPINE FINDINGS Alignment: No static subluxation. Facets are aligned. Occipital condyles are normally positioned. Skull base and vertebrae: No acute fracture. Soft tissues and spinal canal: No prevertebral fluid or swelling. No visible canal hematoma. Disc levels: No advanced spinal canal or neural foraminal stenosis. Upper chest: No pneumothorax, pulmonary nodule or pleural effusion. Other: Normal visualized paraspinal cervical soft tissues. IMPRESSION: 1. No acute intracranial abnormality. 2. No acute  fracture or static subluxation of the cervical spine. Electronically Signed   By: Ulyses Jarred M.D.   On: 10/20/2022 00:25   CT Cervical Spine Wo Contrast  Result Date: 10/20/2022 CLINICAL DATA:  Head trauma EXAM: CT HEAD WITHOUT CONTRAST CT CERVICAL SPINE WITHOUT CONTRAST TECHNIQUE: Multidetector CT imaging of the head and cervical spine was performed following the standard protocol without intravenous contrast. Multiplanar CT image reconstructions of the cervical spine were also generated. RADIATION DOSE REDUCTION: This exam was performed according to the departmental dose-optimization program which includes automated exposure control, adjustment of the mA and/or kV according to patient size and/or use of iterative reconstruction technique. COMPARISON:  None Available. FINDINGS: CT HEAD FINDINGS Brain: There is no mass, hemorrhage or extra-axial collection. The size and configuration of the ventricles and extra-axial CSF spaces are normal. The brain parenchyma is normal, without evidence of acute or chronic infarction. Vascular: No abnormal hyperdensity of the major intracranial arteries or dural venous sinuses. No intracranial atherosclerosis. Skull: The visualized skull base, calvarium and extracranial soft tissues are normal. Sinuses/Orbits: No fluid levels or advanced mucosal thickening of the visualized paranasal sinuses. No mastoid or middle ear  effusion. The orbits are normal. CT CERVICAL SPINE FINDINGS Alignment: No static subluxation. Facets are aligned. Occipital condyles are normally positioned. Skull base and vertebrae: No acute fracture. Soft tissues and spinal canal: No prevertebral fluid or swelling. No visible canal hematoma. Disc levels: No advanced spinal canal or neural foraminal stenosis. Upper chest: No pneumothorax, pulmonary nodule or pleural effusion. Other: Normal visualized paraspinal cervical soft tissues. IMPRESSION: 1. No acute intracranial abnormality. 2. No acute fracture or static subluxation of the cervical spine. Electronically Signed   By: Ulyses Jarred M.D.   On: 10/20/2022 00:25      IMPRESSION AND PLAN:  Assessment and Plan: * Non-traumatic rhabdomyolysis - This is likely secondary to cocaine. - The patient will be admitted to a medical telemetry bed. - We will continue aggressive hydration with IV normal saline. - We will follow serial CK levels.  Hypokalemia - Potassium will be replaced and followed. - Magnesium level will be checked.  Tobacco abuse - She will be counseled for smoking cessation.  Cocaine use - She had subsequent altered mental status/acute encephalopathy. - She will be monitored while she is here.   DVT prophylaxis: Lovenox.  Advanced Care Planning:  Code Status: full code.  Family Communication:  The plan of care was discussed in details with the patient (and family). I answered all questions. The patient agreed to proceed with the above mentioned plan. Further management will depend upon hospital course. Disposition Plan: Back to previous home environment Consults called: none.  All the records are reviewed and case discussed with ED provider.  Status is: Inpatient   At the time of the admission, it appears that the appropriate admission status for this patient is inpatient.  This is judged to be reasonable and necessary in order to provide the required intensity of  service to ensure the patient's safety given the presenting symptoms, physical exam findings and initial radiographic and laboratory data in the context of comorbid conditions.  The patient requires inpatient status due to high intensity of service, high risk of further deterioration and high frequency of surveillance required.  I certify that at the time of admission, it is my clinical judgment that the patient will require inpatient hospital care extending more than 2 midnights.  Dispo: The patient is from: Home              Anticipated d/c is to: Home              Patient currently is not medically stable to d/c.              Difficult to place patient: No  Christel Mormon M.D on 10/20/2022 at 4:20 AM  [facility]   From 7 PM-7 AM, contact night-coverage [email or web address]  CC: Primary care physician; Pcp, No

## 2022-10-20 NOTE — ED Notes (Signed)
Pt brought to ed rm 9 at this time, report received. this RN now assuming care.

## 2022-10-21 DIAGNOSIS — M6282 Rhabdomyolysis: Secondary | ICD-10-CM | POA: Diagnosis not present

## 2022-10-21 LAB — PHOSPHORUS: Phosphorus: 3.1 mg/dL (ref 2.5–4.6)

## 2022-10-21 LAB — CBC
HCT: 33.7 % — ABNORMAL LOW (ref 36.0–46.0)
Hemoglobin: 10.8 g/dL — ABNORMAL LOW (ref 12.0–15.0)
MCH: 28.5 pg (ref 26.0–34.0)
MCHC: 32 g/dL (ref 30.0–36.0)
MCV: 88.9 fL (ref 80.0–100.0)
Platelets: 250 10*3/uL (ref 150–400)
RBC: 3.79 MIL/uL — ABNORMAL LOW (ref 3.87–5.11)
RDW: 14.1 % (ref 11.5–15.5)
WBC: 4.9 10*3/uL (ref 4.0–10.5)
nRBC: 0 % (ref 0.0–0.2)

## 2022-10-21 LAB — BASIC METABOLIC PANEL
Anion gap: 8 (ref 5–15)
BUN: 7 mg/dL (ref 6–20)
CO2: 25 mmol/L (ref 22–32)
Calcium: 8.2 mg/dL — ABNORMAL LOW (ref 8.9–10.3)
Chloride: 105 mmol/L (ref 98–111)
Creatinine, Ser: 0.58 mg/dL (ref 0.44–1.00)
GFR, Estimated: >60 mL/min (ref >60)
Glucose, Bld: 123 mg/dL — ABNORMAL HIGH (ref 70–99)
Potassium: 3 mmol/L — ABNORMAL LOW (ref 3.5–5.1)
Sodium: 138 mmol/L (ref 135–145)

## 2022-10-21 LAB — CK: Total CK: 480 U/L — ABNORMAL HIGH (ref 38–234)

## 2022-10-21 LAB — MAGNESIUM: Magnesium: 1.9 mg/dL (ref 1.7–2.4)

## 2022-10-21 MED ORDER — POTASSIUM CHLORIDE 10 MEQ/100ML IV SOLN
10.0000 meq | INTRAVENOUS | Status: AC
  Administered 2022-10-21 (×4): 10 meq via INTRAVENOUS
  Filled 2022-10-21: qty 100

## 2022-10-21 MED ORDER — AMLODIPINE BESYLATE 5 MG PO TABS
5.0000 mg | ORAL_TABLET | Freq: Every day | ORAL | Status: DC
  Administered 2022-10-21 – 2022-10-22 (×2): 5 mg via ORAL
  Filled 2022-10-21 (×2): qty 1

## 2022-10-21 MED ORDER — POTASSIUM CHLORIDE CRYS ER 20 MEQ PO TBCR
40.0000 meq | EXTENDED_RELEASE_TABLET | Freq: Once | ORAL | Status: AC
  Administered 2022-10-21: 40 meq via ORAL
  Filled 2022-10-21: qty 2

## 2022-10-21 MED ORDER — VITAMIN D (ERGOCALCIFEROL) 1.25 MG (50000 UNIT) PO CAPS
50000.0000 [IU] | ORAL_CAPSULE | ORAL | Status: DC
  Administered 2022-10-21: 50000 [IU] via ORAL
  Filled 2022-10-21: qty 1

## 2022-10-21 NOTE — Progress Notes (Addendum)
[facility] Progress Note  Patient: Tina [name]    [id number]  DOA: 10/19/2022     Date of Service: the patient was seen and examined on 10/21/2022  Chief Complaint  Patient presents with   Drug Reaction   Brief hospital course: Tina [name] is a 42 y.o. African-American female with medical history significant for GERD, depression, gestational diabetes, migraine, and postpartum depression, who presented to the emergency room for concern about drug reaction.  The patient was smoking marijuana with "salt" in it.  She has been feeling restless and shaky since then and has not been able to sleep.  She has been fairly restless.  No recent falls or trauma or head injuries.  She denies any suicidal ideation.  No fever or chills.  She was fairly somnolent during my interview but arousable.  No nausea or vomiting or abdominal pain.  No dysuria, oliguria or hematuria or flank pain.  No chest pain or palpitations.  No cough or wheezing or hemoptysis.   ED Course: When she came to the ER, heart rate was 106 with otherwise normal vital signs.  Labs revealed hypokalemia of 2.5 and total CK of 1886 and later 1492 with high sensitive troponin I of 35 and later 9.  CBC was within normal.  Tylenol level was 11 and later less than 10 with salicylate level of less than 7.  Serum pregnancy test was negative.  Urinalysis was negative.  Urine drug screen was positive for cocaine and marijuana.   EKG as reviewed by me : EKG showed sinus tachycardia with a rate of 107 with Q waves in through septally and ST segment depression inferiorly.   Imaging: Noncontrasted CT scan revealed no acute intra abnormalities and C-spine CT showed no acute fracture or static subluxation of the C-spine.   The patient was given 0.5 mg of IV Ativan and 1 mg IV, 40 mill equivalent p.o. potassium chloride and 10 mill equivalent IV potassium chloride as well as 3 L IV normal saline bolus.  She will be admitted to a medical  telemetry bed for further evaluation and management.   Assessment and Plan:  # Non-traumatic rhabdomyolysis This is likely secondary to cocaine. continue hydration with IV normal saline. CK 1886--1492--1062--480 gradually trending down    # Hypokalemia Potassium repleted. # Hypophosphatemia, Phos repleted. Monitor electrolytes and replete as needed.    # Tobacco abuse Smoking cessation counseling done.    # Cocaine use and marijuana use UDS positive for cocaine and marijuana She had subsequent altered mental status/acute encephalopathy. Encephalopathy resolved, patient is back to her baseline. Drug abuse abstinence counseling done.  # Mouth sore, Orajel ordered.  Vitamin D deficiency: started vitamin D 50,000 units p.o. weekly, follow with PCP to repeat vitamin D level after 3 to 6 months.  Hyperglycemia, patient has history of gestational diabetes mellitus Check hemoglobin A1c Started diabetic diet   Body mass index is 33.95 kg/m.  Interventions:       Diet: Carb modified diet DVT Prophylaxis: Subcutaneous Lovenox   Advance goals of care discussion: Full code  Family Communication: family was present at bedside, at the time of interview.  The pt provided permission to discuss medical plan with the family. Opportunity was given to ask question and all questions were answered satisfactorily.   Disposition:  Pt is from Home, admitted with rhabdomyolysis, cocaine abuse,, still has electrolyte abnormality, which precludes a safe discharge. Discharge to home, when clinically stable, most likely tomorrow a.m.  Subjective:  No significant overnight events, patient was resting comfortably, denied any active issues.  No chest pain or palpitation, no shortness of breath, no abdominal pain.  Patient was only complaining of mouth sores, advised to use Orajel.  Physical Exam: General: NAD, lying comfortably Appear in no distress, affect appropriate Eyes: PERRLA ENT: Oral  Mucosa Clear, moist  Neck: no JVD,  Cardiovascular: S1 and S2 Present, no Murmur,  Respiratory: good respiratory effort, Bilateral Air entry equal and Decreased, no Crackles, no wheezes Abdomen: Bowel Sound present, Soft and no tenderness,  Skin: no rashes Extremities: no Pedal edema, no calf tenderness Neurologic: without any new focal findings Gait not checked due to patient safety concerns  Vitals:   10/20/22 2007 10/21/22 0129 10/21/22 0521 10/21/22 0700  BP: 115/70 128/75 124/72 (!) 141/87  Pulse: 89 78 82 79  Resp: '17 18 18 20  '$ Temp: 97.9 F (36.6 C) 98.5 F (36.9 C) 98.2 F (36.8 C) 97.9 F (36.6 C)  TempSrc: Oral  Oral Oral  SpO2: 98% 100% 100%   Weight:      Height:        Intake/Output Summary (Last 24 hours) at 10/21/2022 1408 Last data filed at 10/21/2022 1022 Gross per 24 hour  Intake 647.69 ml  Output --  Net 647.69 ml   Filed Weights   10/19/22 1442  Weight: 92.5 kg    Data Reviewed: I have personally reviewed and interpreted daily labs, tele strips, imagings as discussed above. I reviewed all nursing notes, pharmacy notes, vitals, pertinent old records I have discussed plan of care as described above with RN and patient/family.  CBC: Recent Labs  Lab 10/19/22 1449 10/20/22 0347 10/21/22 0343  WBC 8.7 6.3 4.9  HGB 12.3 11.5* 10.8*  HCT 37.7 35.5* 33.7*  MCV 86.7 86.8 88.9  PLT 356 289 [id number]   Basic Metabolic Panel: Recent Labs  Lab 10/19/22 1449 10/19/22 1640 10/19/22 1913 10/20/22 0003 10/20/22 0347 10/20/22 0953 10/21/22 0343  NA 136   < > 136 134* 137 135 138  K 2.5*   < > 2.8* 2.9* 2.8* 2.7* 3.0*  CL 102   < > 104 101 105 105 105  CO2 23   < > 23 21* '22 22 25  '$ GLUCOSE 232*   < > 106* 126* 122* 161* 123*  BUN 8   < > 5* <5* <5* <5* 7  CREATININE 0.67   < > 0.63 0.59 0.57 0.57 0.58  CALCIUM 8.4*   < > 7.5* 7.8* 7.5* 7.1* 8.2*  MG 1.9  --   --   --   --  1.9 1.9  PHOS  --   --   --   --   --  1.8* 3.1   < > = values in this  interval not displayed.    Studies: No results found.  Scheduled Meds:  amLODipine  5 mg Oral Daily   enoxaparin (LOVENOX) injection  0.5 mg/kg Subcutaneous Q24H   lidocaine  15 mL Mouth/Throat Once   potassium chloride  40 mEq Oral Once   Vitamin D (Ergocalciferol)  50,000 Units Oral Q7 days   Continuous Infusions:  sodium chloride 125 mL/hr at 10/21/22 1002   potassium chloride 10 mEq (10/21/22 1312)   PRN Meds: acetaminophen **OR** acetaminophen, benzocaine, magnesium hydroxide, traZODone  Time spent: 50 minutes  Author: Val Riles. MD [facility] 10/21/2022 2:08 PM  To reach On-call, see care teams to locate the attending and reach out to them via  [email or web address] If 7PM-7AM, please contact night-coverage If you still have difficulty reaching the attending provider, please page the [facility] (Director on Call) for [facility] on amion for assistance.

## 2022-10-22 DIAGNOSIS — M6282 Rhabdomyolysis: Secondary | ICD-10-CM | POA: Diagnosis not present

## 2022-10-22 LAB — BASIC METABOLIC PANEL
Anion gap: 9 (ref 5–15)
BUN: 8 mg/dL (ref 6–20)
CO2: 24 mmol/L (ref 22–32)
Calcium: 8.7 mg/dL — ABNORMAL LOW (ref 8.9–10.3)
Chloride: 105 mmol/L (ref 98–111)
Creatinine, Ser: 0.58 mg/dL (ref 0.44–1.00)
GFR, Estimated: >60 mL/min (ref >60)
Glucose, Bld: 148 mg/dL — ABNORMAL HIGH (ref 70–99)
Potassium: 3.4 mmol/L — ABNORMAL LOW (ref 3.5–5.1)
Sodium: 138 mmol/L (ref 135–145)

## 2022-10-22 LAB — CBC
HCT: 34.5 % — ABNORMAL LOW (ref 36.0–46.0)
Hemoglobin: 11 g/dL — ABNORMAL LOW (ref 12.0–15.0)
MCH: 28.4 pg (ref 26.0–34.0)
MCHC: 31.9 g/dL (ref 30.0–36.0)
MCV: 89.1 fL (ref 80.0–100.0)
Platelets: 251 10*3/uL (ref 150–400)
RBC: 3.87 MIL/uL (ref 3.87–5.11)
RDW: 14.2 % (ref 11.5–15.5)
WBC: 4.7 10*3/uL (ref 4.0–10.5)
nRBC: 0 % (ref 0.0–0.2)

## 2022-10-22 LAB — CK: Total CK: 195 U/L (ref 38–234)

## 2022-10-22 LAB — MAGNESIUM: Magnesium: 1.7 mg/dL (ref 1.7–2.4)

## 2022-10-22 LAB — PHOSPHORUS: Phosphorus: 3.7 mg/dL (ref 2.5–4.6)

## 2022-10-22 MED ORDER — POTASSIUM CHLORIDE CRYS ER 20 MEQ PO TBCR
40.0000 meq | EXTENDED_RELEASE_TABLET | Freq: Once | ORAL | Status: AC
  Administered 2022-10-22: 40 meq via ORAL
  Filled 2022-10-22: qty 2

## 2022-10-22 MED ORDER — ACYCLOVIR 400 MG PO TABS: 400.0000 mg | ORAL_TABLET | Freq: Every day | ORAL | 0 refills | Status: AC

## 2022-10-22 MED ORDER — AMLODIPINE BESYLATE 5 MG PO TABS: 5.0000 mg | ORAL_TABLET | Freq: Every day | ORAL | 1 refills | Status: AC

## 2022-10-22 MED ORDER — VITAMIN D (ERGOCALCIFEROL) 1.25 MG (50000 UNIT) PO CAPS: 50000.0000 [IU] | ORAL_CAPSULE | ORAL | 0 refills | Status: AC

## 2022-10-22 NOTE — TOC Transition Note (Signed)
Transition of Care [facility]) - CM/SW Discharge Note   Patient Details  Name: Tina [name] MRN: [id number] Date of Birth: May 01, 1981  [facility] CM/SW Contact:  Gerilyn Pilgrim, LCSW Phone Number: 10/22/2022, 10:34 AM   Clinical Narrative:  Pt has orders to discharge. PCP resources and substance use resources added to AVS.             Patient Goals and CMS Choice      Discharge Placement                         Discharge Plan and Services Additional resources added to the After Visit Summary for                                       Social Determinants of Health (SDOH) Interventions SDOH Screenings   Tobacco Use: High Risk (10/19/2022)     Readmission Risk Interventions     No data to display

## 2022-10-22 NOTE — Discharge Summary (Signed)
[facility] Discharge Summary   Patient: Tina [name] [id number]  PCP: Pcp, No  Date of admission: 10/19/2022   Date of discharge:  10/22/2022     Discharge Diagnoses:  Principal Problem:   Non-traumatic rhabdomyolysis Active Problems:   Hypokalemia   Tobacco abuse   Cocaine use   Admitted From: Home Disposition:  Home   Recommendations for Outpatient Follow-up:  F/u PCP in 1 wk, monitor BP and f/u PCP to titrate medication. Repeat Vit D level after 3 months.   Follow up LABS/TEST:  Vit D level after 3 months    Follow-up Information     Pcp, No In 2 days.                 Diet recommendation: Cardiac diet  Activity: The patient is advised to gradually reintroduce usual activities, as tolerated  Discharge Condition: stable  Code Status: Full code   History of present illness: As per the H and P dictated on admission Hospital Course:  Tina [name] is a 42 y.o. African-American female with medical history significant for GERD, depression, gestational diabetes, migraine, and postpartum depression, who presented to the emergency room for concern about drug reaction.  The patient was smoking marijuana with "salt" in it.  She has been feeling restless and shaky since then and has not been able to sleep.  She has been fairly restless.  No recent falls or trauma or head injuries.  She denies any suicidal ideation.  No fever or chills.  She was fairly somnolent during my interview but arousable.  No nausea or vomiting or abdominal pain.  No dysuria, oliguria or hematuria or flank pain.  No chest pain or palpitations.  No cough or wheezing or hemoptysis.   ED Course: When she came to the ER, heart rate was 106 with otherwise normal vital signs.  Labs revealed hypokalemia of 2.5 and total CK of 1886 and later 1492 with high sensitive troponin I of 35 and later 9.  CBC was within normal.  Tylenol level was 11 and later less than 10 with salicylate level of less than  7.  Serum pregnancy test was negative.  Urinalysis was negative.  Urine drug screen was positive for cocaine and marijuana.   EKG as reviewed by me : EKG showed sinus tachycardia with a rate of 107 with Q waves in through septally and ST segment depression inferiorly.   Imaging: Noncontrasted CT scan revealed no acute intra abnormalities and C-spine CT showed no acute fracture or static subluxation of the C-spine.   The patient was given 0.5 mg of IV Ativan and 1 mg IV, 40 mill equivalent p.o. potassium chloride and 10 mill equivalent IV potassium chloride as well as 3 L IV normal saline bolus.  She will be admitted to a medical telemetry bed for further evaluation and management.     Assessment and Plan: # Non-traumatic rhabdomyolysis. This is likely secondary to cocaine. S/p hydration with IV normal saline. CK 1886---195 gradually trended down to normal. # Hypokalemia, Potassium repleted.  Resolved # Hypophosphatemia, Phos repleted.  Resolved  # Tobacco abuse, Smoking cessation counseling done.  # Cocaine use and marijuana use, UDS positive for cocaine and marijuana She had subsequent altered mental status/acute encephalopathy. Encephalopathy resolved, patient is back to her baseline. Drug abuse abstinence counseling done. # Mouth sore, Orajel ordered.  Mouth sore resolved before discharge.  ED physician already prescribed valacyclovir which was sent to the wrong pharmacy so I sent her  prescription to regular pharmacy.  But mouth sore was resolved so advised not to take it and recommended to follow with PCP. # Vitamin D deficiency: started vitamin D 50,000 units p.o. weekly, follow with PCP to repeat vitamin D level after 3 to 6 months. # Hyperglycemia, patient has history of gestational diabetes mellitus, HbA1c 6.3 prediabetic range.  Patient was advised to continue diabetic diet and follow with PCP for further management. # Obesity,Body mass index is 33.95 kg/m.  Nutrition Interventions:  Calorie restricted diet and daily exercise advised to lose body weight.    Patient was ambulatory without any assistance. On the day of the discharge the patient's vitals were stable, and no other acute medical condition were reported by patient. the patient was felt safe to be discharge at Home.  Consultants: None Procedures: None  Discharge Exam: General: Appear in no distress, no Rash; Oral Mucosa Clear, moist. Cardiovascular: S1 and S2 Present, no Murmur, Respiratory: normal respiratory effort, Bilateral Air entry present and no Crackles, no wheezes Abdomen: Bowel Sound present, Soft and no tenderness, no hernia Extremities: no Pedal edema, no calf tenderness Neurology: alert and oriented to time, place, and person affect appropriate.  Filed Weights   10/19/22 1442  Weight: 92.5 kg   Vitals:   10/22/22 0751 10/22/22 0802  BP: (!) 144/104 (!) 144/90  Pulse: 72   Resp: 17   Temp: 98.1 F (36.7 C)   SpO2: 100%     DISCHARGE MEDICATION: Allergies as of 10/22/2022       Reactions   Penicillins         Medication List     STOP taking these medications    hydrOXYzine 25 MG tablet Commonly known as: ATARAX   ibuprofen 800 MG tablet Commonly known as: ADVIL   ketorolac 10 MG tablet Commonly known as: TORADOL   meloxicam 15 MG tablet Commonly known as: MOBIC   methocarbamol 500 MG tablet Commonly known as: Robaxin   ondansetron 4 MG disintegrating tablet Commonly known as: Zofran ODT   polyethylene glycol 17 g packet Commonly known as: MIRALAX / GLYCOLAX       TAKE these medications    acyclovir 400 MG tablet Commonly known as: Zovirax Take 1 tablet (400 mg total) by mouth 5 (five) times daily for 5 days.   amLODipine 5 MG tablet Commonly known as: NORVASC Take 1 tablet (5 mg total) by mouth daily. Skip the dose if systolic BP [id number] mmHg Start taking on: October 23, 2022   Vitamin D (Ergocalciferol) 1.25 MG (50000 UNIT) Caps capsule Commonly  known as: DRISDOL Take 1 capsule (50,000 Units total) by mouth every 7 (seven) days. Start taking on: October 28, 2022       Allergies  Allergen Reactions   Penicillins    Discharge Instructions     Ambulatory referral to Cardiology   Complete by: As directed    Call MD for:  difficulty breathing, headache or visual disturbances   Complete by: As directed    Call MD for:  extreme fatigue   Complete by: As directed    Call MD for:  persistant dizziness or light-headedness   Complete by: As directed    Call MD for:  persistant nausea and vomiting   Complete by: As directed    Call MD for:  severe uncontrolled pain   Complete by: As directed    Call MD for:  temperature >100.4   Complete by: As directed    Diet - low  sodium heart healthy   Complete by: As directed    Discharge instructions   Complete by: As directed    F/u PCP in 1 wk, monitor BP and f/u PCP to titrate medication. Repeat Vit D level after 3 months.   Increase activity slowly   Complete by: As directed        The results of significant diagnostics from this hospitalization (including imaging, microbiology, ancillary and laboratory) are listed below for reference.    Significant Diagnostic Studies: CT HEAD WO CONTRAST (5MM)  Result Date: 10/20/2022 CLINICAL DATA:  Head trauma EXAM: CT HEAD WITHOUT CONTRAST CT CERVICAL SPINE WITHOUT CONTRAST TECHNIQUE: Multidetector CT imaging of the head and cervical spine was performed following the standard protocol without intravenous contrast. Multiplanar CT image reconstructions of the cervical spine were also generated. RADIATION DOSE REDUCTION: This exam was performed according to the departmental dose-optimization program which includes automated exposure control, adjustment of the mA and/or kV according to patient size and/or use of iterative reconstruction technique. COMPARISON:  None Available. FINDINGS: CT HEAD FINDINGS Brain: There is no mass, hemorrhage or extra-axial  collection. The size and configuration of the ventricles and extra-axial CSF spaces are normal. The brain parenchyma is normal, without evidence of acute or chronic infarction. Vascular: No abnormal hyperdensity of the major intracranial arteries or dural venous sinuses. No intracranial atherosclerosis. Skull: The visualized skull base, calvarium and extracranial soft tissues are normal. Sinuses/Orbits: No fluid levels or advanced mucosal thickening of the visualized paranasal sinuses. No mastoid or middle ear effusion. The orbits are normal. CT CERVICAL SPINE FINDINGS Alignment: No static subluxation. Facets are aligned. Occipital condyles are normally positioned. Skull base and vertebrae: No acute fracture. Soft tissues and spinal canal: No prevertebral fluid or swelling. No visible canal hematoma. Disc levels: No advanced spinal canal or neural foraminal stenosis. Upper chest: No pneumothorax, pulmonary nodule or pleural effusion. Other: Normal visualized paraspinal cervical soft tissues. IMPRESSION: 1. No acute intracranial abnormality. 2. No acute fracture or static subluxation of the cervical spine. Electronically Signed   By: Ulyses Jarred M.D.   On: 10/20/2022 00:25   CT Cervical Spine Wo Contrast  Result Date: 10/20/2022 CLINICAL DATA:  Head trauma EXAM: CT HEAD WITHOUT CONTRAST CT CERVICAL SPINE WITHOUT CONTRAST TECHNIQUE: Multidetector CT imaging of the head and cervical spine was performed following the standard protocol without intravenous contrast. Multiplanar CT image reconstructions of the cervical spine were also generated. RADIATION DOSE REDUCTION: This exam was performed according to the departmental dose-optimization program which includes automated exposure control, adjustment of the mA and/or kV according to patient size and/or use of iterative reconstruction technique. COMPARISON:  None Available. FINDINGS: CT HEAD FINDINGS Brain: There is no mass, hemorrhage or extra-axial collection. The  size and configuration of the ventricles and extra-axial CSF spaces are normal. The brain parenchyma is normal, without evidence of acute or chronic infarction. Vascular: No abnormal hyperdensity of the major intracranial arteries or dural venous sinuses. No intracranial atherosclerosis. Skull: The visualized skull base, calvarium and extracranial soft tissues are normal. Sinuses/Orbits: No fluid levels or advanced mucosal thickening of the visualized paranasal sinuses. No mastoid or middle ear effusion. The orbits are normal. CT CERVICAL SPINE FINDINGS Alignment: No static subluxation. Facets are aligned. Occipital condyles are normally positioned. Skull base and vertebrae: No acute fracture. Soft tissues and spinal canal: No prevertebral fluid or swelling. No visible canal hematoma. Disc levels: No advanced spinal canal or neural foraminal stenosis. Upper chest: No pneumothorax, pulmonary nodule or  pleural effusion. Other: Normal visualized paraspinal cervical soft tissues. IMPRESSION: 1. No acute intracranial abnormality. 2. No acute fracture or static subluxation of the cervical spine. Electronically Signed   By: Ulyses Jarred M.D.   On: 10/20/2022 00:25    Microbiology: No results found for this or any previous visit (from the past 240 hour(s)).   Labs: CBC: Recent Labs  Lab 10/19/22 1449 10/20/22 0347 10/21/22 0343 10/22/22 0408  WBC 8.7 6.3 4.9 4.7  HGB 12.3 11.5* 10.8* 11.0*  HCT 37.7 35.5* 33.7* 34.5*  MCV 86.7 86.8 88.9 89.1  PLT 356 289 250 [id number]   Basic Metabolic Panel: Recent Labs  Lab 10/19/22 1449 10/19/22 1640 10/20/22 0003 10/20/22 0347 10/20/22 0953 10/21/22 0343 10/22/22 0408  NA 136   < > 134* 137 135 138 138  K 2.5*   < > 2.9* 2.8* 2.7* 3.0* 3.4*  CL 102   < > 101 105 105 105 105  CO2 23   < > 21* '22 22 25 24  '$ GLUCOSE 232*   < > 126* 122* 161* 123* 148*  BUN 8   < > <5* <5* <5* 7 8  CREATININE 0.67   < > 0.59 0.57 0.57 0.58 0.58  CALCIUM 8.4*   < > 7.8* 7.5*  7.1* 8.2* 8.7*  MG 1.9  --   --   --  1.9 1.9 1.7  PHOS  --   --   --   --  1.8* 3.1 3.7   < > = values in this interval not displayed.   Liver Function Tests: Recent Labs  Lab 10/19/22 1449  AST 42*  ALT 21  ALKPHOS 46  BILITOT 0.8  PROT 7.5  ALBUMIN 3.9   No results for input(s): "LIPASE", "AMYLASE" in the last 168 hours. No results for input(s): "AMMONIA" in the last 168 hours. Cardiac Enzymes: Recent Labs  Lab 10/19/22 1449 10/19/22 1913 10/20/22 0347 10/21/22 0343 10/22/22 0408  CKTOTAL 1,886* 1,492* 1,062* 480* 195   BNP (last 3 results) No results for input(s): "BNP" in the last 8760 hours. CBG: No results for input(s): "GLUCAP" in the last 168 hours.  Time spent: 35 minutes  Signed:  Val Riles  [facility] 10/22/2022 10:20 AM

## 2022-10-22 NOTE — Progress Notes (Signed)
Patient and partner verbalized understanding of all discharge instructions including follow up and abstaining from illicit drug use. Patient requests to have significant other transport her via w/c to front lobby to wait on their ride.Marland Kitchen

## 2022-10-23 LAB — HEMOGLOBIN A1C
Hgb A1c MFr Bld: 6.3 % — ABNORMAL HIGH (ref 4.8–5.6)
Mean Plasma Glucose: 134 mg/dL

## 2022-12-24 DIAGNOSIS — Z419 Encounter for procedure for purposes other than remedying health state, unspecified: Secondary | ICD-10-CM | POA: Diagnosis not present

## 2023-01-12 DIAGNOSIS — F4323 Adjustment disorder with mixed anxiety and depressed mood: Secondary | ICD-10-CM | POA: Diagnosis not present

## 2023-01-15 ENCOUNTER — Encounter: Payer: Self-pay | Admitting: Emergency Medicine

## 2023-01-15 ENCOUNTER — Other Ambulatory Visit: Payer: Self-pay

## 2023-01-15 ENCOUNTER — Emergency Department
Admission: EM | Admit: 2023-01-15 | Discharge: 2023-01-15 | Disposition: A | Discharge status: Home / Self Care | Payer: Medicaid Other | Attending: Emergency Medicine | Admitting: Emergency Medicine

## 2023-01-15 DIAGNOSIS — M545 Low back pain, unspecified: Secondary | ICD-10-CM | POA: Diagnosis not present

## 2023-01-15 DIAGNOSIS — M546 Pain in thoracic spine: Secondary | ICD-10-CM | POA: Insufficient documentation

## 2023-01-15 DIAGNOSIS — Y92410 Unspecified street and highway as the place of occurrence of the external cause: Secondary | ICD-10-CM | POA: Diagnosis not present

## 2023-01-15 MED ORDER — MELOXICAM 7.5 MG PO TABS: 7.5000 mg | ORAL_TABLET | Freq: Every day | ORAL | 2 refills | Status: AC

## 2023-01-15 MED ORDER — METHOCARBAMOL 500 MG PO TABS: 500.0000 mg | ORAL_TABLET | Freq: Four times a day (QID) | ORAL | 0 refills | Status: AC | PRN
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
## 2023-01-24 DIAGNOSIS — Z419 Encounter for procedure for purposes other than remedying health state, unspecified: Secondary | ICD-10-CM | POA: Diagnosis not present

## 2023-02-03 DIAGNOSIS — F4323 Adjustment disorder with mixed anxiety and depressed mood: Secondary | ICD-10-CM | POA: Diagnosis not present

## 2023-02-23 DIAGNOSIS — Z419 Encounter for procedure for purposes other than remedying health state, unspecified: Secondary | ICD-10-CM | POA: Diagnosis not present

## 2023-03-06 ENCOUNTER — Encounter: Payer: Self-pay | Admitting: Intensive Care

## 2023-03-06 ENCOUNTER — Emergency Department
Admission: EM | Admit: 2023-03-06 | Discharge: 2023-03-06 | Discharge status: Against Medical Advice | Payer: Medicaid Other | Attending: Emergency Medicine | Admitting: Emergency Medicine

## 2023-03-06 ENCOUNTER — Other Ambulatory Visit: Payer: Self-pay

## 2023-03-06 DIAGNOSIS — Z5321 Procedure and treatment not carried out due to patient leaving prior to being seen by health care provider: Secondary | ICD-10-CM | POA: Insufficient documentation

## 2023-03-06 DIAGNOSIS — R110 Nausea: Secondary | ICD-10-CM | POA: Diagnosis not present

## 2023-03-06 DIAGNOSIS — R5383 Other fatigue: Secondary | ICD-10-CM | POA: Insufficient documentation

## 2023-03-06 DIAGNOSIS — R519 Headache, unspecified: Secondary | ICD-10-CM | POA: Diagnosis not present

## 2023-03-06 HISTORY — DX: Essential (primary) hypertension: I10

## 2023-03-06 HISTORY — DX: Pure hypercholesterolemia, unspecified: E78.00

## 2023-03-06 LAB — CBC
HCT: 39.7 % (ref 36.0–46.0)
Hemoglobin: 12.6 g/dL (ref 12.0–15.0)
MCH: 27 pg (ref 26.0–34.0)
MCHC: 31.7 g/dL (ref 30.0–36.0)
MCV: 85.2 fL (ref 80.0–100.0)
Platelets: 267 10*3/uL (ref 150–400)
RBC: 4.66 MIL/uL (ref 3.87–5.11)
RDW: 15.3 % (ref 11.5–15.5)
WBC: 6.7 10*3/uL (ref 4.0–10.5)
nRBC: 0 % (ref 0.0–0.2)

## 2023-03-06 LAB — COMPREHENSIVE METABOLIC PANEL
ALT: 12 U/L (ref 0–44)
AST: 20 U/L (ref 15–41)
Albumin: 4 g/dL (ref 3.5–5.0)
Alkaline Phosphatase: 44 U/L (ref 38–126)
Anion gap: 8 (ref 5–15)
BUN: 14 mg/dL (ref 6–20)
CO2: 24 mmol/L (ref 22–32)
Calcium: 8.7 mg/dL — ABNORMAL LOW (ref 8.9–10.3)
Chloride: 106 mmol/L (ref 98–111)
Creatinine, Ser: 0.7 mg/dL (ref 0.44–1.00)
GFR, Estimated: >60 mL/min (ref >60)
Glucose, Bld: 132 mg/dL — ABNORMAL HIGH (ref 70–99)
Potassium: 3.3 mmol/L — ABNORMAL LOW (ref 3.5–5.1)
Sodium: 138 mmol/L (ref 135–145)
Total Bilirubin: 0.3 mg/dL (ref 0.3–1.2)
Total Protein: 7.3 g/dL (ref 6.5–8.1)

## 2023-03-06 LAB — CBG MONITORING, ED: Glucose-Capillary: 145 mg/dL — ABNORMAL HIGH (ref 70–99)

## 2023-03-06 LAB — LIPASE, BLOOD: Lipase: 33 U/L (ref 11–51)

## 2023-03-06 NOTE — ED Notes (Signed)
Patient to first nurse desk and reports "I need to leave. My probation officer is calling and making me leave."   Patient then requesting note from staff at front saying she was seen here in the ED. The patient made aware that she has not been seen by a provider. Patient argumentative about getting a note. Patient then walked out of lobby and seen getting into car and pulling off. Patient walked out with another patient.

## 2023-03-06 NOTE — ED Triage Notes (Signed)
Patient c/o fatigue, nausea, and headache X1 day. Borderline diabetic.

## 2023-03-26 DIAGNOSIS — Z419 Encounter for procedure for purposes other than remedying health state, unspecified: Secondary | ICD-10-CM | POA: Diagnosis not present

## 2023-03-31 DIAGNOSIS — F4323 Adjustment disorder with mixed anxiety and depressed mood: Secondary | ICD-10-CM | POA: Diagnosis not present

## 2023-04-26 DIAGNOSIS — Z419 Encounter for procedure for purposes other than remedying health state, unspecified: Secondary | ICD-10-CM | POA: Diagnosis not present

## 2023-05-26 DIAGNOSIS — Z419 Encounter for procedure for purposes other than remedying health state, unspecified: Secondary | ICD-10-CM | POA: Diagnosis not present

## 2023-06-26 DIAGNOSIS — Z419 Encounter for procedure for purposes other than remedying health state, unspecified: Secondary | ICD-10-CM | POA: Diagnosis not present

## 2023-07-26 DIAGNOSIS — Z419 Encounter for procedure for purposes other than remedying health state, unspecified: Secondary | ICD-10-CM | POA: Diagnosis not present

## 2023-08-26 DIAGNOSIS — Z419 Encounter for procedure for purposes other than remedying health state, unspecified: Secondary | ICD-10-CM | POA: Diagnosis not present

## 2023-09-26 DIAGNOSIS — Z419 Encounter for procedure for purposes other than remedying health state, unspecified: Secondary | ICD-10-CM | POA: Diagnosis not present

## 2023-10-24 DIAGNOSIS — Z419 Encounter for procedure for purposes other than remedying health state, unspecified: Secondary | ICD-10-CM | POA: Diagnosis not present

## 2023-11-12 DIAGNOSIS — F4323 Adjustment disorder with mixed anxiety and depressed mood: Secondary | ICD-10-CM | POA: Diagnosis not present

## 2023-12-05 DIAGNOSIS — Z419 Encounter for procedure for purposes other than remedying health state, unspecified: Secondary | ICD-10-CM | POA: Diagnosis not present

## 2024-01-04 ENCOUNTER — Ambulatory Visit: Payer: Self-pay | Admitting: Family Medicine

## 2024-01-04 ENCOUNTER — Encounter: Payer: Self-pay | Admitting: Family Medicine

## 2024-01-04 DIAGNOSIS — Z113 Encounter for screening for infections with a predominantly sexual mode of transmission: Secondary | ICD-10-CM

## 2024-01-04 DIAGNOSIS — Z202 Contact with and (suspected) exposure to infections with a predominantly sexual mode of transmission: Secondary | ICD-10-CM

## 2024-01-04 DIAGNOSIS — Z419 Encounter for procedure for purposes other than remedying health state, unspecified: Secondary | ICD-10-CM | POA: Diagnosis not present

## 2024-01-04 LAB — HM HEPATITIS C SCREENING LAB: HM Hepatitis Screen: NEGATIVE

## 2024-01-04 LAB — HM HIV SCREENING LAB: HM HIV Screening: NEGATIVE

## 2024-01-04 MED ORDER — DOXYCYCLINE HYCLATE 100 MG PO TABS: 100.0000 mg | ORAL_TABLET | Freq: Two times a day (BID) | ORAL | 0 refills | Status: AC

## 2024-01-04 NOTE — Progress Notes (Signed)
 [facility] Department STI clinic [address], [address] [place] [address] Main phone: [phone]  STI screening visit  Subjective:  Tina [name] is a 43 y.o. female being seen today for an STI screening visit. The patient reports they do not have symptoms.  Patient reports that they do not desire a pregnancy in the next year.   They reported they are not interested in discussing contraception today.    Patient's last menstrual period was 01/03/2024 (exact date).  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   Does the patient using douching products? No  Last HIV test per patient/review of record was No results found for: "HMHIVSCREEN"  Lab Results  Component Value Date   HIV Non Reactive 10/20/2022     Last HEPC test per patient/review of record was No results found for: "HMHEPCSCREEN" No components found for: "HEPC"   Last HEPB test per patient/review of record was No components found for: "HMHEPBSCREEN"   Patient reports last pap was:   No results found for: "DIAGPAP", "HPVHIGH", "ADEQPAP" No results found for:  "SPECADGYN" Result Date Procedure Results Follow-ups  06/19/2014 Cytology - PAP CYTOLOGY - PAP: PAP RESULT     Screening for MPX risk: Does the patient have an unexplained rash? Yes Is the patient MSM? No Does the patient endorse multiple sex partners or anonymous sex partners? No Did the patient have close or sexual contact with a person diagnosed with MPX? No Has the patient traveled outside the [place]  where MPX is endemic? No Is there a high clinical suspicion for MPX-- evidenced by one of the following No  -Unlikely to be chickenpox  -Lymphadenopathy  -Rash that present in same phase of evolution on any given body part  See flowsheet for further details and programmatic requirements Hyperlink available at the top of the signed note in blue.  Flow sheet content below:  Pregnancy Intention Screening Does the patient want to become pregnant in the next year?: No Does the patient's partner want to become pregnant in the next year?: No Would the patient like to discuss contraceptive options today?: No Reason For STD Screen STD Screening: Is a contact Have you ever had an STD?: No History of Antibiotic use in the past 2 weeks?: No STD Symptoms Denies all: Yes Risk Factors for Hep B Household, sexual, or needle sharing contact of a person infected with Hep B: No Sexual contact with a person who uses drugs not as prescribed?: No Currently or Ever used drugs not as prescribed: Yes HIV Positive: No PRep Patient: No Men who have sex with men: N/A Have Hepatitis C: No History of Incarceration: Yes History of Homeslessness?: No Anal sex  following anal drug use?: No Risk Factors for Hep C Currently using drugs not as prescribed: No Sexual partner(s) currently using drugs as not prescribed: No History of drug use: Yes HIV Positive: No People with a history of incarceration: Yes People born between the years of 71 and 6: No Hepatitis Counseling Hep B Counseling: Patient accepts  testing for Hep B today Hep C Counseling: Patient accepts testing for Hep C today Abuse History Has patient ever been abused physically?: No Has patient ever been abused sexually?: No Does patient feel they have a problem with Anxiety?: No Does patient feel they have a problem with Depression?: No Counseling Patient counseled to use condoms with all sex: Condoms given RTC in 2-3 weeks for test results: Yes Clinic will call if test results abnormal before test result appt.: Yes Test results given to patient Patient counseled to use condoms with all sex: Condoms given   Immunization history:  Immunization History  Administered Date(s) Administered   Tdap 04/24/2012, 05/04/2013, 10/16/2014, 01/17/2020    The following portions of the patient's history were reviewed and updated as appropriate: allergies, current medications, past medical history, past social history, past surgical history and problem list.  Objective:  There were no vitals filed for this visit.  Physical Exam Vitals and nursing note reviewed.  Constitutional:      Appearance: Normal appearance.  HENT:     Head: Normocephalic.     Mouth/Throat:     Mouth: Mucous membranes are moist.  Cardiovascular:     Rate and Rhythm: Normal rate.  Pulmonary:     Effort: Pulmonary effort is normal.  Abdominal:     Palpations: Abdomen is soft.  Genitourinary:    Comments: Declined genital exam- no symptoms, self swabbed Musculoskeletal:        General: Normal range of motion.  Lymphadenopathy:     Head:     Right side of head: No submandibular, preauricular or posterior auricular adenopathy.     Left side of head: No submandibular, preauricular or posterior auricular adenopathy.     Cervical: No cervical adenopathy.     Upper Body:     Right upper body: No supraclavicular or axillary adenopathy.     Left upper body: No supraclavicular or axillary adenopathy.  Skin:    General: Skin is warm and dry.          Comments:  Generalized rash covering BLE- dark, flat irregular spots   Neurological:     Mental Status: She is alert and oriented to person, place, and time.  Psychiatric:        Mood and Affect: Mood normal.     Assessment and Plan:  [name] is a 43 y.o. female presenting to the [facility] Department for STI screening  1. 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 Screening for venereal disease  - Syphilis Serology, [facility] - HIV/HCV [facility] - HBV Antigen/[facility]   Patient accepted screenings including  bloodwork for HIV/RPR, and wet prep. Patient meets criteria for HepB screening? Yes. Ordered? yes Patient meets criteria for HepC screening? Yes. Ordered? yes  Treat wet prep per standing order Discussed time line  for [facility] results and that patient will be called with positive results and encouraged patient to call if she had not heard in 2 weeks.  Counseled to return or seek care for continued or worsening symptoms Recommended repeat testing in 3 months with positive results. Recommended condom use with all sex for STI prevention.   Patient is currently using nothing to prevent pregnancy.    Return if symptoms worsen or fail to improve, for STI screening.  No future appointments.  Earleen Glazier, [place]

## 2024-01-05 NOTE — Progress Notes (Signed)
 Pt here for STI screening and for treatment as contact to syphilis.  Pt is allergic to PCN.  Doxycycline  100mg . #28 1 capsule BID x 14 days dispensed with patient.  Counseled re medication, side effects, plan of care and when to contact clinic with questions or concerns.  Verbalizes understanding.  Condoms provided.-Savan Ruta, RN

## 2024-01-06 LAB — HBV ANTIGEN/ANTIBODY STATE LAB
Hep B Core Total Ab: NONREACTIVE
Hep B S Ab: REACTIVE
Hepatitis B Surface Antigen: NONREACTIVE

## 2024-01-13 ENCOUNTER — Encounter: Payer: Self-pay | Admitting: Family Medicine

## 2024-02-04 DIAGNOSIS — Z419 Encounter for procedure for purposes other than remedying health state, unspecified: Secondary | ICD-10-CM | POA: Diagnosis not present

## 2024-03-05 DIAGNOSIS — Z419 Encounter for procedure for purposes other than remedying health state, unspecified: Secondary | ICD-10-CM | POA: Diagnosis not present

## 2024-04-05 DIAGNOSIS — Z419 Encounter for procedure for purposes other than remedying health state, unspecified: Secondary | ICD-10-CM | POA: Diagnosis not present

## 2024-05-05 ENCOUNTER — Encounter: Payer: Self-pay | Admitting: Emergency Medicine

## 2024-05-05 ENCOUNTER — Other Ambulatory Visit: Payer: Self-pay

## 2024-05-05 ENCOUNTER — Emergency Department
Admission: EM | Admit: 2024-05-05 | Discharge: 2024-05-05 | Disposition: A | Discharge status: Home / Self Care | Attending: Emergency Medicine | Admitting: Emergency Medicine

## 2024-05-05 DIAGNOSIS — E876 Hypokalemia: Secondary | ICD-10-CM | POA: Insufficient documentation

## 2024-05-05 DIAGNOSIS — R112 Nausea with vomiting, unspecified: Secondary | ICD-10-CM

## 2024-05-05 DIAGNOSIS — R509 Fever, unspecified: Secondary | ICD-10-CM | POA: Insufficient documentation

## 2024-05-05 DIAGNOSIS — B349 Viral infection, unspecified: Secondary | ICD-10-CM | POA: Diagnosis not present

## 2024-05-05 LAB — COMPREHENSIVE METABOLIC PANEL WITH GFR
ALT: 11 U/L (ref 0–44)
AST: 19 U/L (ref 15–41)
Albumin: 3.8 g/dL (ref 3.5–5.0)
Alkaline Phosphatase: 45 U/L (ref 38–126)
Anion gap: 10 (ref 5–15)
BUN: 16 mg/dL (ref 6–20)
CO2: 25 mmol/L (ref 22–32)
Calcium: 9 mg/dL (ref 8.9–10.3)
Chloride: 105 mmol/L (ref 98–111)
Creatinine, Ser: 0.88 mg/dL (ref 0.44–1.00)
GFR, Estimated: >60 mL/min (ref >60)
Glucose, Bld: 174 mg/dL — ABNORMAL HIGH (ref 70–99)
Potassium: 3.1 mmol/L — ABNORMAL LOW (ref 3.5–5.1)
Sodium: 140 mmol/L (ref 135–145)
Total Bilirubin: 0.3 mg/dL (ref 0.0–1.2)
Total Protein: 7.2 g/dL (ref 6.5–8.1)

## 2024-05-05 LAB — CBC
HCT: 41.3 % (ref 36.0–46.0)
Hemoglobin: 13.2 g/dL (ref 12.0–15.0)
MCH: 28.5 pg (ref 26.0–34.0)
MCHC: 32 g/dL (ref 30.0–36.0)
MCV: 89.2 fL (ref 80.0–100.0)
Platelets: 236 K/uL (ref 150–400)
RBC: 4.63 MIL/uL (ref 3.87–5.11)
RDW: 12.7 % (ref 11.5–15.5)
WBC: 6.4 K/uL (ref 4.0–10.5)
nRBC: 0 % (ref 0.0–0.2)

## 2024-05-05 LAB — RESP PANEL BY RT-PCR (RSV, FLU A&B, COVID)  RVPGX2
Influenza A by PCR: NEGATIVE
Influenza B by PCR: NEGATIVE
Resp Syncytial Virus by PCR: NEGATIVE
SARS Coronavirus 2 by RT PCR: NEGATIVE

## 2024-05-05 LAB — LIPASE, BLOOD: Lipase: 38 U/L (ref 11–51)

## 2024-05-05 MED ORDER — ONDANSETRON HCL 4 MG PO TABS: 4.0000 mg | ORAL_TABLET | Freq: Four times a day (QID) | ORAL | 0 refills | Status: AC | PRN

## 2024-05-05 MED ORDER — POTASSIUM CHLORIDE CRYS ER 20 MEQ PO TBCR: 20.0000 meq | EXTENDED_RELEASE_TABLET | Freq: Every day | ORAL | 0 refills | Status: AC

## 2024-05-05 NOTE — ED Provider Notes (Signed)
 [facility] Provider Note    Event Date/Time   First MD Initiated Contact with Patient 05/05/24 1238     (approximate)   History   Emesis   HPI  Tina [name] is a 43 year old female presenting to the emergency department for evaluation of bodyaches and vomiting.  Yesterday, patient had onset of weakness, subjective fevers, body aches.  Does report sick contacts.    Physical Exam   Triage Vital Signs: ED Triage Vitals  Encounter Vitals Group     BP 05/05/24 1124 (!) 174/109     Girls Systolic BP Percentile --      Girls Diastolic BP Percentile --      Boys Systolic BP Percentile --      Boys Diastolic BP Percentile --      Pulse Rate 05/05/24 1124 91     Resp 05/05/24 1124 16     Temp 05/05/24 1124 98.6 F (37 C)     Temp Source 05/05/24 1124 Oral     SpO2 05/05/24 1124 98 %     Weight 05/05/24 1124 170 lb (77.1 kg)     Height 05/05/24 1124 5' 5 (1.651 m)     Head Circumference --      Peak Flow --      Pain Score 05/05/24 1126 7     Pain Loc --      Pain Education --      Exclude from Growth Chart --     Most recent vital signs: Vitals:   05/05/24 1124  BP: (!) 174/109  Pulse: 91  Resp: 16  Temp: 98.6 F (37 C)  SpO2: 98%     General: Awake, interactive  CV:  Regular rate, good peripheral perfusion.  Resp:  Unlabored respirations Abd:  Nondistended, soft, no significant tenderness to palpation Neuro:  Symmetric facial movement, fluid speech   ED Results / Procedures / Treatments   Labs (all labs ordered are listed, but only abnormal results are displayed) Labs Reviewed  COMPREHENSIVE METABOLIC PANEL WITH GFR - Abnormal; Notable for the following components:      Result Value   Potassium 3.1 (*)    Glucose, Bld 174 (*)    All other components within normal limits  RESP PANEL BY RT-PCR (RSV, FLU A&B, COVID)  RVPGX2  LIPASE, BLOOD  CBC     EKG EKG independently reviewed and interpreted by myself demonstrates:     RADIOLOGY Imaging independently reviewed and interpreted by myself demonstrates:   Formal Radiology Read:  No results found.  PROCEDURES:  Critical Care performed: No  Procedures   MEDICATIONS ORDERED IN ED: Medications - No data to display   IMPRESSION / MDM / ASSESSMENT AND PLAN / ED COURSE  I reviewed the triage vital signs and the nursing notes.  Differential diagnosis includes, but is not limited to, viral illness, electrolyte abnormality, anemia, low suspicion acute intra-abdominal process given reassuring abdominal exam  Patient's presentation is most consistent with acute illness / injury with system symptoms.  43 year old female presenting to the emergency department for evaluation of bodyaches with multiple associated viral symptoms.  Stable vitals on presentation.  Labs with mild hypokalemia, otherwise reassuring.  Suspect likely viral illness.  Discussed supportive care.  Patient did not wish to stay for symptomatic treatment here, do think she is stable for discharge.  Will DC with prescription for Zofran  and short course of potassium.  Strict return precautions provided.       FINAL  CLINICAL IMPRESSION(S) / ED DIAGNOSES   Final diagnoses:  Nausea and vomiting, unspecified vomiting type  Viral illness  Hypokalemia     Rx / DC Orders   ED Discharge Orders          Ordered    ondansetron  (ZOFRAN ) 4 MG tablet  Every 6 hours PRN        05/05/24 1248    potassium chloride  SA (KLOR-CON  M) 20 MEQ tablet  Daily        05/05/24 1248             Note:  This document was prepared using Dragon voice recognition software and may include unintentional dictation errors.   Levander Slate, MD 05/05/24 1248

## 2024-05-05 NOTE — ED Notes (Signed)
 See triage note  States she presents with some n/v  and some body aches  While she was waiting she began to feel better   States she took some meds PTA  and thinks they are kicking in

## 2024-05-05 NOTE — Discharge Instructions (Addendum)
 You were seen in the ER today for your body aches and vomiting.  I suspect you have a viral illness.  Sent a prescription for some potassium supplementation as well as nausea medication to your pharmacy.  Follow-up your primary care doctor for further evaluation.  Return to the ER for new or worsening symptoms.

## 2024-05-05 NOTE — ED Triage Notes (Addendum)
 Patient to ED via POV for vomiting/body aches. Started yesterday. +sick contacts. Pt noted to be nodding off multiple times during triage. Visitor states She is just real tired. She worked 12 hours last night. Denies drug and alcohol use.

## 2024-05-06 DIAGNOSIS — Z419 Encounter for procedure for purposes other than remedying health state, unspecified: Secondary | ICD-10-CM | POA: Diagnosis not present

## 2024-07-06 DIAGNOSIS — Z419 Encounter for procedure for purposes other than remedying health state, unspecified: Secondary | ICD-10-CM | POA: Diagnosis not present
